# Patient Record
Sex: Male | Born: 1935 | Race: White | Hispanic: No | Marital: Married | Smoking: Former smoker
Health system: Southern US, Community
[De-identification: ages and names within clinical notes are randomized; demographics above are authoritative.]

## PROBLEM LIST (undated history)

## (undated) DIAGNOSIS — Z86010 Personal history of colonic polyps: Principal | ICD-10-CM

## (undated) DIAGNOSIS — I495 Sick sinus syndrome: Secondary | ICD-10-CM

## (undated) DIAGNOSIS — F411 Generalized anxiety disorder: Secondary | ICD-10-CM

## (undated) DIAGNOSIS — F329 Major depressive disorder, single episode, unspecified: Secondary | ICD-10-CM

## (undated) DIAGNOSIS — C61 Malignant neoplasm of prostate: Secondary | ICD-10-CM

## (undated) DIAGNOSIS — J309 Allergic rhinitis, unspecified: Secondary | ICD-10-CM

## (undated) HISTORY — DX: Major depressive disorder, single episode, unspecified: F32.9

## (undated) HISTORY — PX: NASAL SINUS SURGERY: SHX719

## (undated) HISTORY — PX: TONSILLECTOMY: SHX5217

## (undated) HISTORY — DX: Malignant neoplasm of prostate: C61

## (undated) HISTORY — PX: PROSTATE SURGERY: SHX751

## (undated) HISTORY — DX: Sick sinus syndrome: I49.5

## (undated) HISTORY — DX: Allergic rhinitis, unspecified: J30.9

## (undated) HISTORY — PX: OTHER SURGICAL HISTORY: SHX169

## (undated) HISTORY — DX: Generalized anxiety disorder: F41.1

## (undated) HISTORY — DX: Personal history of colonic polyps: Z86.010

---

## 2000-12-08 ENCOUNTER — Other Ambulatory Visit: Admission: RE | Admit: 2000-12-08 | Discharge: 2000-12-08 | Payer: Self-pay | Admitting: Otolaryngology

## 2003-03-30 ENCOUNTER — Ambulatory Visit (HOSPITAL_BASED_OUTPATIENT_CLINIC_OR_DEPARTMENT_OTHER): Admission: RE | Admit: 2003-03-30 | Discharge: 2003-03-30 | Payer: Self-pay | Admitting: Otolaryngology

## 2004-08-29 ENCOUNTER — Ambulatory Visit: Payer: Self-pay | Admitting: Internal Medicine

## 2004-09-26 ENCOUNTER — Ambulatory Visit: Payer: Self-pay | Admitting: Internal Medicine

## 2005-08-16 ENCOUNTER — Ambulatory Visit: Payer: Self-pay | Admitting: Internal Medicine

## 2005-09-16 ENCOUNTER — Ambulatory Visit: Payer: Self-pay | Admitting: Internal Medicine

## 2005-09-23 ENCOUNTER — Ambulatory Visit: Payer: Self-pay | Admitting: Gastroenterology

## 2005-10-03 ENCOUNTER — Encounter (INDEPENDENT_AMBULATORY_CARE_PROVIDER_SITE_OTHER): Payer: Self-pay | Admitting: *Deleted

## 2005-10-03 ENCOUNTER — Ambulatory Visit: Payer: Self-pay | Admitting: Gastroenterology

## 2006-02-06 ENCOUNTER — Ambulatory Visit: Payer: Self-pay | Admitting: Family Medicine

## 2006-04-01 ENCOUNTER — Ambulatory Visit (HOSPITAL_COMMUNITY): Payer: Self-pay | Admitting: *Deleted

## 2006-07-23 ENCOUNTER — Ambulatory Visit: Payer: Self-pay | Admitting: Internal Medicine

## 2006-09-28 ENCOUNTER — Ambulatory Visit: Payer: Self-pay | Admitting: Internal Medicine

## 2006-09-28 LAB — CONVERTED CEMR LAB
AST: 24 units/L (ref 0–37)
Bilirubin, Direct: 0.1 mg/dL (ref 0.0–0.3)
CO2: 30 meq/L (ref 19–32)
Chol/HDL Ratio, serum: 2.6
GFR calc non Af Amer: 78 mL/min
Glomerular Filtration Rate, Af Am: 95 mL/min/{1.73_m2}
HCT: 43.8 % (ref 39.0–52.0)
HDL: 63.2 mg/dL (ref >39.0)
PSA: 7.87 ng/mL — ABNORMAL HIGH (ref 0.10–4.00)
Potassium: 4.3 meq/L (ref 3.5–5.1)
RDW: 12.3 % (ref 11.5–14.6)
Sodium: 141 meq/L (ref 135–145)
Total Bilirubin: 1 mg/dL (ref 0.3–1.2)
Triglyceride fasting, serum: 56 mg/dL (ref 0–149)
VLDL: 11 mg/dL (ref 0–40)
WBC: 3.4 10*3/uL — ABNORMAL LOW (ref 4.5–10.5)

## 2006-11-11 ENCOUNTER — Ambulatory Visit (HOSPITAL_COMMUNITY): Admission: RE | Admit: 2006-11-11 | Discharge: 2006-11-11 | Payer: Self-pay | Admitting: Urology

## 2006-12-04 ENCOUNTER — Ambulatory Visit: Admission: RE | Admit: 2006-12-04 | Discharge: 2007-02-17 | Payer: Self-pay | Admitting: Radiation Oncology

## 2007-01-13 ENCOUNTER — Encounter (INDEPENDENT_AMBULATORY_CARE_PROVIDER_SITE_OTHER): Payer: Self-pay | Admitting: *Deleted

## 2007-01-13 ENCOUNTER — Inpatient Hospital Stay (HOSPITAL_COMMUNITY): Admission: RE | Admit: 2007-01-13 | Discharge: 2007-01-15 | Payer: Self-pay | Admitting: Urology

## 2007-02-05 ENCOUNTER — Ambulatory Visit: Payer: Self-pay | Admitting: Internal Medicine

## 2007-05-18 ENCOUNTER — Telehealth (INDEPENDENT_AMBULATORY_CARE_PROVIDER_SITE_OTHER): Payer: Self-pay | Admitting: *Deleted

## 2007-05-18 DIAGNOSIS — M79609 Pain in unspecified limb: Secondary | ICD-10-CM | POA: Insufficient documentation

## 2007-07-29 ENCOUNTER — Ambulatory Visit: Payer: Self-pay | Admitting: Internal Medicine

## 2007-10-05 ENCOUNTER — Ambulatory Visit: Payer: Self-pay | Admitting: Internal Medicine

## 2007-10-05 DIAGNOSIS — Z860100 Personal history of colon polyps, unspecified: Secondary | ICD-10-CM | POA: Insufficient documentation

## 2007-10-05 DIAGNOSIS — F411 Generalized anxiety disorder: Secondary | ICD-10-CM

## 2007-10-05 DIAGNOSIS — C61 Malignant neoplasm of prostate: Secondary | ICD-10-CM

## 2007-10-05 DIAGNOSIS — J309 Allergic rhinitis, unspecified: Secondary | ICD-10-CM | POA: Insufficient documentation

## 2007-10-05 HISTORY — DX: Malignant neoplasm of prostate: C61

## 2007-10-05 HISTORY — DX: Allergic rhinitis, unspecified: J30.9

## 2007-10-05 HISTORY — DX: Generalized anxiety disorder: F41.1

## 2007-10-05 HISTORY — DX: Personal history of colonic polyps: Z86.010

## 2007-10-05 HISTORY — DX: Personal history of colon polyps, unspecified: Z86.0100

## 2007-10-05 LAB — CONVERTED CEMR LAB
Basophils Absolute: 0 10*3/uL (ref 0.0–0.1)
Basophils Relative: 0.2 % (ref 0.0–1.0)
CO2: 32 meq/L (ref 19–32)
Calcium: 9.8 mg/dL (ref 8.4–10.5)
Cholesterol: 147 mg/dL (ref 0–200)
GFR calc non Af Amer: 78 mL/min
Glucose, Bld: 88 mg/dL (ref 70–99)
HCT: 40 % (ref 39.0–52.0)
HDL: 61.1 mg/dL (ref >39.0)
Monocytes Absolute: 0.2 10*3/uL (ref 0.2–0.7)
Monocytes Relative: 7.3 % (ref 3.0–11.0)
Neutro Abs: 1.7 10*3/uL (ref 1.4–7.7)
PSA: 0 ng/mL — ABNORMAL LOW (ref 0.10–4.00)
Platelets: 155 10*3/uL (ref 150–400)
RDW: 12.1 % (ref 11.5–14.6)
TSH: 2.71 microintl units/mL (ref 0.35–5.50)
Total CHOL/HDL Ratio: 2.4
Total Protein: 6.3 g/dL (ref 6.0–8.3)
Triglycerides: 58 mg/dL (ref 0–149)

## 2007-12-17 ENCOUNTER — Telehealth (INDEPENDENT_AMBULATORY_CARE_PROVIDER_SITE_OTHER): Payer: Self-pay | Admitting: *Deleted

## 2008-07-26 ENCOUNTER — Ambulatory Visit: Payer: Self-pay | Admitting: Internal Medicine

## 2008-10-04 ENCOUNTER — Ambulatory Visit: Payer: Self-pay | Admitting: Internal Medicine

## 2008-10-04 DIAGNOSIS — F329 Major depressive disorder, single episode, unspecified: Secondary | ICD-10-CM

## 2008-10-04 DIAGNOSIS — I495 Sick sinus syndrome: Secondary | ICD-10-CM | POA: Insufficient documentation

## 2008-10-04 DIAGNOSIS — F3289 Other specified depressive episodes: Secondary | ICD-10-CM

## 2008-10-04 HISTORY — DX: Major depressive disorder, single episode, unspecified: F32.9

## 2008-10-04 HISTORY — DX: Sick sinus syndrome: I49.5

## 2008-10-04 HISTORY — DX: Other specified depressive episodes: F32.89

## 2008-10-04 LAB — CONVERTED CEMR LAB
BUN: 18 mg/dL (ref 6–23)
Basophils Relative: 0.8 % (ref 0.0–3.0)
Bilirubin, Direct: 0.1 mg/dL (ref 0.0–0.3)
Cholesterol: 152 mg/dL (ref 0–200)
Creatinine, Ser: 0.9 mg/dL (ref 0.4–1.5)
Eosinophils Absolute: 0.1 10*3/uL (ref 0.0–0.7)
GFR calc Af Amer: 106 mL/min
GFR calc non Af Amer: 88 mL/min
Glucose, Bld: 91 mg/dL (ref 70–99)
Hemoglobin: 13.7 g/dL (ref 13.0–17.0)
LDL Cholesterol: 72 mg/dL (ref 0–99)
Lymphocytes Relative: 24.1 % (ref 12.0–46.0)
MCHC: 34.4 g/dL (ref 30.0–36.0)
MCV: 101.5 fL — ABNORMAL HIGH (ref 78.0–100.0)
Neutro Abs: 2 10*3/uL (ref 1.4–7.7)
Platelets: 131 10*3/uL — ABNORMAL LOW (ref 150–400)
RBC: 3.93 M/uL — ABNORMAL LOW (ref 4.22–5.81)
RDW: 12.1 % (ref 11.5–14.6)
Sodium: 143 meq/L (ref 135–145)
Total Bilirubin: 0.9 mg/dL (ref 0.3–1.2)
Total Protein: 6.4 g/dL (ref 6.0–8.3)
Triglycerides: 56 mg/dL (ref 0–149)
VLDL: 11 mg/dL (ref 0–40)

## 2008-11-07 ENCOUNTER — Ambulatory Visit: Payer: Self-pay | Admitting: Internal Medicine

## 2008-11-07 DIAGNOSIS — H6120 Impacted cerumen, unspecified ear: Secondary | ICD-10-CM | POA: Insufficient documentation

## 2008-12-11 ENCOUNTER — Telehealth: Payer: Self-pay | Admitting: Internal Medicine

## 2008-12-13 ENCOUNTER — Ambulatory Visit: Payer: Self-pay | Admitting: Internal Medicine

## 2009-07-10 ENCOUNTER — Ambulatory Visit: Payer: Self-pay | Admitting: Internal Medicine

## 2009-09-20 ENCOUNTER — Ambulatory Visit: Payer: Self-pay | Admitting: Internal Medicine

## 2009-09-20 DIAGNOSIS — J069 Acute upper respiratory infection, unspecified: Secondary | ICD-10-CM | POA: Insufficient documentation

## 2009-10-04 ENCOUNTER — Ambulatory Visit: Payer: Self-pay | Admitting: Internal Medicine

## 2009-10-04 LAB — CONVERTED CEMR LAB
ALT: 28 units/L (ref 0–53)
Albumin: 4.2 g/dL (ref 3.5–5.2)
Alkaline Phosphatase: 101 units/L (ref 39–117)
BUN: 16 mg/dL (ref 6–23)
Basophils Relative: 0.6 % (ref 0.0–3.0)
Bilirubin, Direct: 0 mg/dL (ref 0.0–0.3)
Calcium: 9.3 mg/dL (ref 8.4–10.5)
Eosinophils Relative: 2.6 % (ref 0.0–5.0)
GFR calc non Af Amer: 87.48 mL/min (ref >60)
Glucose, Bld: 91 mg/dL (ref 70–99)
Hemoglobin: 13.3 g/dL (ref 13.0–17.0)
Lymphocytes Relative: 24.8 % (ref 12.0–46.0)
Lymphs Abs: 0.8 10*3/uL (ref 0.7–4.0)
MCHC: 34.1 g/dL (ref 30.0–36.0)
MCV: 102.7 fL — ABNORMAL HIGH (ref 78.0–100.0)
Monocytes Absolute: 0.3 10*3/uL (ref 0.1–1.0)
Monocytes Relative: 9.3 % (ref 3.0–12.0)
Neutro Abs: 1.9 10*3/uL (ref 1.4–7.7)
Neutrophils Relative %: 62.7 % (ref 43.0–77.0)
Potassium: 5 meq/L (ref 3.5–5.1)
RDW: 12.5 % (ref 11.5–14.6)
TSH: 3.05 microintl units/mL (ref 0.35–5.50)
WBC: 3.1 10*3/uL — ABNORMAL LOW (ref 4.5–10.5)

## 2009-10-19 ENCOUNTER — Ambulatory Visit: Payer: Self-pay | Admitting: Internal Medicine

## 2009-10-19 ENCOUNTER — Telehealth: Payer: Self-pay | Admitting: Internal Medicine

## 2009-11-08 ENCOUNTER — Ambulatory Visit: Payer: Self-pay | Admitting: Internal Medicine

## 2010-03-20 ENCOUNTER — Ambulatory Visit: Payer: Self-pay | Admitting: Internal Medicine

## 2010-03-20 DIAGNOSIS — R197 Diarrhea, unspecified: Secondary | ICD-10-CM | POA: Insufficient documentation

## 2010-06-27 ENCOUNTER — Ambulatory Visit: Payer: Self-pay | Admitting: Internal Medicine

## 2010-09-12 ENCOUNTER — Encounter: Payer: Self-pay | Admitting: Internal Medicine

## 2010-10-04 ENCOUNTER — Ambulatory Visit: Payer: Self-pay | Admitting: Internal Medicine

## 2010-10-04 ENCOUNTER — Encounter: Payer: Self-pay | Admitting: Internal Medicine

## 2010-10-04 LAB — CONVERTED CEMR LAB
Albumin: 4 g/dL (ref 3.5–5.2)
BUN: 17 mg/dL (ref 6–23)
Basophils Absolute: 0 10*3/uL (ref 0.0–0.1)
Basophils Relative: 0.3 % (ref 0.0–3.0)
Eosinophils Relative: 1.5 % (ref 0.0–5.0)
GFR calc non Af Amer: 95.79 mL/min (ref >60.00)
Glucose, Bld: 88 mg/dL (ref 70–99)
HDL: 66.5 mg/dL (ref >39.00)
LDL Cholesterol: 86 mg/dL (ref 0–99)
Lymphocytes Relative: 20.9 % (ref 12.0–46.0)
Lymphs Abs: 0.7 10*3/uL (ref 0.7–4.0)
Monocytes Absolute: 0.3 10*3/uL (ref 0.1–1.0)
Neutro Abs: 2.3 10*3/uL (ref 1.4–7.7)
Potassium: 4.8 meq/L (ref 3.5–5.1)
RBC: 3.84 M/uL — ABNORMAL LOW (ref 4.22–5.81)
Sodium: 138 meq/L (ref 135–145)
Total Bilirubin: 1 mg/dL (ref 0.3–1.2)
Total Protein: 6.3 g/dL (ref 6.0–8.3)
Triglycerides: 54 mg/dL (ref 0.0–149.0)
WBC: 3.4 10*3/uL — ABNORMAL LOW (ref 4.5–10.5)

## 2010-11-12 NOTE — Assessment & Plan Note (Signed)
Summary: ear is stopped up/dm   Vital Signs:  Patient profile:   77 year old male Weight:      181 pounds Temp:     98.0 degrees F oral BP sitting:   120 / 76  (left arm) Cuff size:   regular  Vitals Entered By: Raechel Ache, RN (November 08, 2009 9:16 AM) CC: R ear stopped up and ringing.   CC:  R ear stopped up and ringing.Marland Kitchen  History of Present Illness: 77 year old patient with a 3-day history of diminished auditory acuity from the right ear.  No pain or discharge.  He has a history of anxiety disorder, depression, and prostate cancer, which have been stable.  Allergies: No Known Drug Allergies  Past History:  Past Medical History: Reviewed history from 10/04/2008 and no changes required. Anxiety Colonic polyps, hx of history of prostate cancer Allergic rhinitis Depression sinus bradycardia  Physical Exam  General:  Well-developed,well-nourished,in no acute distress; alert,appropriate and cooperative throughout examination Head:  Normocephalic and atraumatic without obvious abnormalities. No apparent alopecia or balding. Ears:  left canal and membrane normal with only a scanty amount of cerumen; right canal occluded with cerumen   Impression & Recommendations:  Problem # 1:  CERUMEN IMPACTION, RIGHT (ICD-380.4) right canal irrigated until clear  Problem # 2:  DEPRESSION (ICD-311)  His updated medication list for this problem includes:    Lexapro 10 Mg Tabs (Escitalopram oxalate) .Marland Kitchen... Take 1 tablet by mouth once a day    Niravam 0.5 Mg Tbdp (Alprazolam) .Marland Kitchen... 1 qid as needed  Complete Medication List: 1)  Lexapro 10 Mg Tabs (Escitalopram oxalate) .... Take 1 tablet by mouth once a day 2)  Niravam 0.5 Mg Tbdp (Alprazolam) .Marland Kitchen.. 1 qid as needed 3)  Doxycycline Hyclate 100 Mg Tabs (Doxycycline hyclate) .... One twice daily 4)  Alavert 10 Mg Tbdp (Loratadine) .... Use daily  Patient Instructions: 1)  Please schedule a follow-up appointment as needed.

## 2010-11-12 NOTE — Assessment & Plan Note (Signed)
Summary: EVAL OF BUMP / SORE ON BACK SIDE OF HEAD / BASE OF HEAD-NECK ...   Vital Signs:  Patient profile:   77 year old male Weight:      192 pounds Temp:     98.2 degrees F oral BP sitting:   118 / 78  (right arm) Cuff size:   regular  Vitals Entered By: Duard Brady LPN (June 27, 2010 7:57 AM) CC: eval 'bump' on back of neck , c/o loose stools Is Patient Diabetic? No   CC:  eval 'bump' on back of neck  and c/o loose stools.  History of Present Illness: 77 -year-old patient who is seen today for follow-up.  He has a history of anxiety, depression, and is followed by psychiatry.  He has been under considerable situational stress due to the mental health of his daughter.  He has had some mild diarrhea associated with increase in anxiety and insomnia.  He also complains of a fullness involving his left posterior neck area.  He is scheduled for a physical in 3 months. Flu Vaccine Consent Questions     Do you have a history of severe allergic reactions to this vaccine? no    Any prior history of allergic reactions to egg and/or gelatin? no    Do you have a sensitivity to the preservative Thimersol? no    Do you have a past history of Guillan-Barre Syndrome? no    Do you currently have an acute febrile illness? no    Have you ever had a severe reaction to latex? no    Vaccine information given and explained to patient? yes    Are you currently pregnant? no    Lot Number:[id number]   Exp Date:04/12/2011   Site Given  Left Deltoid IM  Allergies (verified): No Known Drug Allergies  Past History:  Past Medical History: Reviewed history from 10/04/2008 and no changes required. Anxiety Colonic polyps, hx of history of prostate cancer Allergic rhinitis Depression sinus bradycardia  Review of Systems  The patient denies anorexia, fever, weight loss, weight gain, vision loss, decreased hearing, hoarseness, chest pain, syncope, dyspnea on exertion, peripheral edema,  prolonged cough, headaches, hemoptysis, abdominal pain, melena, hematochezia, severe indigestion/heartburn, hematuria, incontinence, genital sores, muscle weakness, suspicious skin lesions, difficulty walking, depression, unusual weight change, abnormal bleeding, enlarged lymph nodes, angioedema, breast masses, and testicular masses.    Physical Exam  General:  Well-developed,well-nourished,in no acute distress; alert,appropriate and cooperative throughout examination Head:  Normocephalic and atraumatic without obvious abnormalities. No apparent alopecia or balding. Eyes:  No corneal or conjunctival inflammation noted. EOMI. Perrla. Funduscopic exam benign, without hemorrhages, exudates or papilledema. Vision grossly normal. Ears:  cerumen in the right canal and Mouth:  Oral mucosa and oropharynx without lesions or exudates.  Teeth in good repair. Neck:  no abnormalities noted  Lungs:  Normal respiratory effort, chest expands symmetrically. Lungs are clear to auscultation, no crackles or wheezes. Heart:  Normal rate and regular rhythm. S1 and S2 normal without gallop, murmur, click, rub or other extra sounds. Abdomen:  Bowel sounds positive,abdomen soft and non-tender without masses, organomegaly or hernias noted. Msk:  No deformity or scoliosis noted of thoracic or lumbar spine.   Pulses:  pedal pulses, full   Impression & Recommendations:  Problem # 1:  CERUMEN IMPACTION, RIGHT (ICD-380.4)  Problem # 2:  ANXIETY (ICD-300.00)  His updated medication list for this problem includes:    Lexapro 10 Mg Tabs (Escitalopram oxalate) .Marland Kitchen... Take 1 tablet by mouth  once a day    Niravam 0.5 Mg Tbdp (Alprazolam) .Marland Kitchen... 1 qid as needed  His updated medication list for this problem includes:    Lexapro 10 Mg Tabs (Escitalopram oxalate) .Marland Kitchen... Take 1 tablet by mouth once a day    Niravam 0.5 Mg Tbdp (Alprazolam) .Marland Kitchen... 1 qid as needed  Complete Medication List: 1)  Lexapro 10 Mg Tabs (Escitalopram  oxalate) .... Take 1 tablet by mouth once a day 2)  Niravam 0.5 Mg Tbdp (Alprazolam) .Marland Kitchen.. 1 qid as needed 3)  Biotin Forte 3 Mg Tabs (Biotin) .... Qd  Other Orders: Flu Vaccine 75yrs + MEDICARE PATIENTS (J4782) Administration Flu vaccine - MCR (N5621)  Patient Instructions: 1)  annual exam as scheduled in 3 months 2)  Please schedule a follow-up appointment as needed.

## 2010-11-12 NOTE — Assessment & Plan Note (Signed)
Summary: congestion//ccm   Vital Signs:  Patient profile:   77 year old male Weight:      180 pounds BMI:     25.92 Temp:     98.6 degrees F oral  Vitals Entered By: Raechel Ache, RN (October 19, 2009 10:29 AM) CC: Sinuses better but still trouble breathing.   CC:  Sinuses better but still trouble breathing.Marland Kitchen  History of Present Illness: 77 year old patient who is seen today for follow-up.  He was seen last month and treated for a sinus infection.  His symptoms have large resolved.  He still has some occasional nasal stuffiness.  After a minor soft tissue injury and the need for a tetanus update.  He was seen as a work in due to his sinus congestion.  Symptoms are minor.  He has had allergy testing and immunotherapy in the past  Allergies: No Known Drug Allergies  Past History:  Past Medical History: Reviewed history from 10/04/2008 and no changes required. Anxiety Colonic polyps, hx of history of prostate cancer Allergic rhinitis Depression sinus bradycardia  Review of Systems  The patient denies anorexia, fever, weight loss, weight gain, vision loss, decreased hearing, hoarseness, chest pain, syncope, dyspnea on exertion, peripheral edema, prolonged cough, headaches, hemoptysis, abdominal pain, melena, hematochezia, severe indigestion/heartburn, hematuria, incontinence, genital sores, muscle weakness, suspicious skin lesions, transient blindness, depression, unusual weight change, abnormal bleeding, enlarged lymph nodes, angioedema, breast masses, and testicular masses.    Physical Exam  General:  Well-developed,well-nourished,in no acute distress; alert,appropriate and cooperative throughout examination Head:  Normocephalic and atraumatic without obvious abnormalities. No apparent alopecia or balding. Eyes:  No corneal or conjunctival inflammation noted. EOMI. Perrla. Funduscopic exam benign, without hemorrhages, exudates or papilledema. Vision grossly normal. Ears:   cerumen left canal Nose:  External nasal examination shows no deformity or inflammation. Nasal mucosa are pink and moist without lesions or exudates. Mouth:  Oral mucosa and oropharynx without lesions or exudates.  Teeth in good repair. Neck:  No deformities, masses, or tenderness noted. Lungs:  Normal respiratory effort, chest expands symmetrically. Lungs are clear to auscultation, no crackles or wheezes.   Impression & Recommendations:  Problem # 1:  ALLERGIC RHINITIS (ICD-477.9)  His updated medication list for this problem includes:    Alavert 10 Mg Tbdp (Loratadine) ..... Use daily  His updated medication list for this problem includes:    Alavert 10 Mg Tbdp (Loratadine) ..... Use daily  Complete Medication List: 1)  Lexapro 10 Mg Tabs (Escitalopram oxalate) .... Take 1 tablet by mouth once a day 2)  Niravam 0.5 Mg Tbdp (Alprazolam) .Marland Kitchen.. 1 qid as needed 3)  Doxycycline Hyclate 100 Mg Tabs (Doxycycline hyclate) .... One twice daily 4)  Alavert 10 Mg Tbdp (Loratadine) .... Use daily  Other Orders: TD Toxoids IM 7 YR + (16109) Admin 1st Vaccine (60454)  Patient Instructions: 1)  Please schedule a follow-up appointment as needed.   Immunization History:  Pneumovax Immunization History:    Pneumovax:  pneumovax (07/14/2003)  Immunizations Administered:  Tetanus Vaccine:    Vaccine Type: Td    Site: left deltoid    Mfr: Sanofi Pasteur    Dose: 0.5 ml    Route: IM    Given by: Raechel Ache, RN    Exp. Date: 01/24/2011    Lot #: [id number]    VIS given: 08/31/07 version given October 19, 2009.

## 2010-11-12 NOTE — Assessment & Plan Note (Signed)
Summary: diarrhea/dm   Vital Signs:  Patient profile:   77 year old male Weight:      190 pounds Temp:     98.2 degrees F oral BP sitting:   128 / 80  (right arm) Cuff size:   regular  Vitals Entered By: Duard Brady LPN (March 20, 5408 10:09 AM) ouCC: c/o diarrhea on/off x2wks  past PO intake Is Patient Diabetic? No   CC:  c/o diarrhea on/off x2wks  past PO intake.  History of Present Illness:  77 year old patient who presents with a two week history of occasional loose stools; they seem to be precipitated by certain foods and dietary factors. There has been no nausea or vomiting or abdominal pain.  Loose stools occur usually once per day following a large meal.  Since his last visit here.  There is been a modest 10-pound weight gain.  He does state that his clothes are fitting much tighter.  He does have a history of depression, which has been stable  Preventive Screening-Counseling & Management  Alcohol-Tobacco     Smoking Status: quit  Allergies (verified): No Known Drug Allergies  Past History:  Past Medical History: Reviewed history from 10/04/2008 and no changes required. Anxiety Colonic polyps, hx of history of prostate cancer Allergic rhinitis Depression sinus bradycardia  Family History: Reviewed history from 10/05/2007 and no changes required. father died age 75, prostate cancer mother died age 75  One sister, history of breast cancer  Social History: Smoking Status:  quit  Review of Systems  The patient denies anorexia, fever, weight loss, weight gain, vision loss, decreased hearing, hoarseness, chest pain, syncope, dyspnea on exertion, peripheral edema, prolonged cough, headaches, hemoptysis, abdominal pain, melena, hematochezia, severe indigestion/heartburn, hematuria, incontinence, genital sores, muscle weakness, suspicious skin lesions, transient blindness, difficulty walking, depression, unusual weight change, abnormal bleeding, enlarged lymph  nodes, angioedema, breast masses, and testicular masses.    Physical Exam  General:  Well-developed,well-nourished,in no acute distress; alert,appropriate and cooperative throughout examination Head:  Normocephalic and atraumatic without obvious abnormalities. No apparent alopecia or balding. Eyes:  No corneal or conjunctival inflammation noted. EOMI. Perrla. Funduscopic exam benign, without hemorrhages, exudates or papilledema. Vision grossly normal. Mouth:  Oral mucosa and oropharynx without lesions or exudates.  Teeth in good repair. Neck:  No deformities, masses, or tenderness noted. Lungs:  Normal respiratory effort, chest expands symmetrically. Lungs are clear to auscultation, no crackles or wheezes. Heart:  Normal rate and regular rhythm. S1 and S2 normal without gallop, murmur, click, rub or other extra sounds. Abdomen:  Bowel sounds positive,abdomen soft and non-tender without masses, organomegaly or hernias noted. Msk:  No deformity or scoliosis noted of thoracic or lumbar spine.     Impression & Recommendations:  Problem # 1:  DEPRESSION (ICD-311)  His updated medication list for this problem includes:    Lexapro 10 Mg Tabs (Escitalopram oxalate) .Marland Kitchen... Take 1 tablet by mouth once a day    Niravam 0.5 Mg Tbdp (Alprazolam) .Marland Kitchen... 1 qid as needed  His updated medication list for this problem includes:    Lexapro 10 Mg Tabs (Escitalopram oxalate) .Marland Kitchen... Take 1 tablet by mouth once a day    Niravam 0.5 Mg Tbdp (Alprazolam) .Marland Kitchen... 1 qid as needed  Problem # 2:  DIARRHEA, ACUTE (ICD-787.91)  Complete Medication List: 1)  Lexapro 10 Mg Tabs (Escitalopram oxalate) .... Take 1 tablet by mouth once a day 2)  Niravam 0.5 Mg Tbdp (Alprazolam) .Marland Kitchen.. 1 qid as needed  Patient  Instructions: 1)  Align  one daily  2)  Please schedule a follow-up appointment as needed. 3)  Please schedule a follow-up appointment in 6 months for annual exam

## 2010-11-12 NOTE — Letter (Signed)
Summary: [facility]  [facility]   Imported By: Maryln Gottron 09/18/2010 15:02:24  _____________________________________________________________________  External Attachment:    Type:   Image     Comment:   External Document

## 2010-11-12 NOTE — Progress Notes (Signed)
Summary: tetanus & pneumonia vac  Phone Note Call from Patient   Caller: live Call For: kwia Summary of Call: Small wound to arm from a nail last pm.  Asking about need of tetanus & also pneumonia.  TD 2001Pnemovax 2004.  He is coming this am for ov & will get tetanus & pneumonia also if Dr. Kirtland Bouchard says.   Initial call taken by: Rudy Jew, RN,  October 19, 2009 8:39 AM

## 2010-11-14 NOTE — Assessment & Plan Note (Signed)
Summary: PT WILL COME IN FASTING/NJR   Vital Signs:  Patient profile:   77 year old male Height:      71 inches Weight:      192 pounds BMI:     26.88 Temp:     98.1 degrees F oral BP sitting:   128 / 72  (right arm) Cuff size:   regular  Vitals Entered By: Duard Brady LPN (October 04, 2010 8:38 AM) CC: cpx- doing well Is Patient Diabetic? No   CC:  cpx- doing well.  History of Present Illness: 77 year old patient who is in today for a health maintenance examination. He has a history of prostate cancer and is followed by urology.  He is doing quite well today and has had a recent urological evaluation.  He has a history also of colonic polyps  Here for Medicare AWV:  1.   Risk factors based on Past M, S, F history: is factors include   age 77.   Physical Activities: remains quite active.  walks 45 minutes daily 3.   Depression/mood: remote history depression, which has been stable for some time.  He is on chronic medications 4.   Hearing: no significant deficit 5.   ADL's: remains independent in all aspects of daily living 6.   Fall Risk: low 7.   Home Safety: no problem identified 8.   Height, weight, &visual acuity:height and weight stable.  No difficulty with visual acuity did have a recent eye examination.  This past week 9.   Counseling: heart healthy diet, exercise regimen, and modest weight loss.  All encouraged 10.   Labs ordered based on risk factors: laboratory profile, including PSA will be reviewed 11.           Referral Coordination- follow-up urology, and GI 12.           Care Plan- will need a colonoscopy, and urology follow-up 13.            Cognitive Assessment- alert and oriented, with normal affect.  No cognitive dysfunction   Allergies (verified): No Known Drug Allergies  Past History:  Past Medical History: Anxiety Colonic polyps, hx of history of prostate cancer Allergic rhinitis Depression sinus bradycardia  Past Surgical  History: Reviewed history from 10/05/2007 and no changes required. radical prostatectomy April 2008 Sinus surgery  septoplasty June 2004 Tonsillectomy age 77 psychotherapy in the 70s colonoscopy December 2006  Family History: Reviewed history from 10/05/2007 and no changes required. father died age 54, prostate cancer mother died age 32  One sister, history of breast cancer  Social History: Reviewed history from 10/05/2007 and no changes required. two children, one daughter her bipolar depression  Review of Systems  The patient denies anorexia, fever, weight loss, weight gain, vision loss, decreased hearing, hoarseness, chest pain, syncope, dyspnea on exertion, peripheral edema, prolonged cough, headaches, hemoptysis, abdominal pain, melena, hematochezia, severe indigestion/heartburn, hematuria, incontinence, genital sores, muscle weakness, suspicious skin lesions, transient blindness, difficulty walking, depression, unusual weight change, abnormal bleeding, enlarged lymph nodes, angioedema, breast masses, and testicular masses.    Physical Exam  General:  Well-developed,well-nourished,in no acute distress; alert,appropriate and cooperative throughout examination Head:  Normocephalic and atraumatic without obvious abnormalities. No apparent alopecia or balding. Eyes:  No corneal or conjunctival inflammation noted. EOMI. Perrla. Funduscopic exam benign, without hemorrhages, exudates or papilledema. Vision grossly normal. Ears:  External ear exam shows no significant lesions or deformities.  Otoscopic examination reveals clear canals, tympanic membranes are intact bilaterally  without bulging, retraction, inflammation or discharge. Hearing is grossly normal bilaterally. Nose:  External nasal examination shows no deformity or inflammation. Nasal mucosa are pink and moist without lesions or exudates. Mouth:  Oral mucosa and oropharynx without lesions or exudates.  Teeth in good repair. Neck:   No deformities, masses, or tenderness noted. Chest Wall:  No deformities, masses, tenderness or gynecomastia noted. Breasts:  No masses or gynecomastia noted Lungs:  Normal respiratory effort, chest expands symmetrically. Lungs are clear to auscultation, no crackles or wheezes. Heart:  Normal rate and regular rhythm. S1 and S2 normal without gallop, murmur, click, rub or other extra sounds. Abdomen:  Bowel sounds positive,abdomen soft and non-tender without masses, organomegaly or hernias noted. Rectal:  No external abnormalities noted. Normal sphincter tone. No rectal masses or tenderness. Genitalia:  Testes bilaterally descended without nodularity, tenderness or masses. No scrotal masses or lesions. No penis lesions or urethral discharge. Prostate:  surgically absent Msk:  No deformity or scoliosis noted of thoracic or lumbar spine.   Pulses:  R and L carotid,radial,femoral,dorsalis pedis and posterior tibial pulses are full and equal bilaterally Extremities:  No clubbing, cyanosis, edema, or deformity noted with normal full range of motion of all joints.   Neurologic:  No cranial nerve deficits noted. Station and gait are normal. Plantar reflexes are down-going bilaterally. DTRs are symmetrical throughout. Sensory, motor and coordinative functions appear intact. Skin:  Intact without suspicious lesions or rashes Cervical Nodes:  No lymphadenopathy noted Axillary Nodes:  No palpable lymphadenopathy Inguinal Nodes:  No significant adenopathy Psych:  Cognition and judgment appear intact. Alert and cooperative with normal attention span and concentration. No apparent delusions, illusions, hallucinations   Impression & Recommendations:  Problem # 1:  Preventive Health Care (ICD-V70.0)  Orders: Medicare -1st Annual Wellness Visit [phone]) Venipuncture (10272) TLB-Lipid Panel (80061-LIPID)  Complete Medication List: 1)  Lexapro 10 Mg Tabs (Escitalopram oxalate) .... Take 1 tablet by mouth  once a day 2)  Niravam 0.5 Mg Tbdp (Alprazolam) .Marland Kitchen.. 1 qid as needed 3)  Biotin Forte 3 Mg Tabs (Biotin) .... Qd 4)  Aspir-low 81 Mg Tbec (Aspirin)  Other Orders: TLB-BMP (Basic Metabolic Panel-BMET) (80048-METABOL) TLB-CBC Platelet - w/Differential (85025-CBCD) TLB-Hepatic/Liver Function Pnl (80076-HEPATIC) TLB-TSH (Thyroid Stimulating Hormone) (84443-TSH) Specimen Handling (53664)  Patient Instructions: 1)  Limit your Sodium (Salt) to less than 2 grams a day(slightly less than 1/2 a teaspoon) to prevent fluid retention, swelling, or worsening of symptoms. 2)  It is important that you exercise regularly at least 20 minutes 5 times a week. If you develop chest pain, have severe difficulty breathing, or feel very tired , stop exercising immediately and seek medical attention. 3)  You need to lose weight. Consider a lower calorie diet and regular exercise.  4)  Take an Aspirin every day. And aAnd a  Orders Added: 1)  Medicare -1st Annual Wellness Visit [G0438] 2)  Est. Patient Level III [40347] 3)  Venipuncture [36415] 4)  TLB-Lipid Panel [80061-LIPID] 5)  TLB-BMP (Basic Metabolic Panel-BMET) [80048-METABOL] 6)  TLB-CBC Platelet - w/Differential [85025-CBCD] 7)  TLB-Hepatic/Liver Function Pnl [80076-HEPATIC] 8)  TLB-TSH (Thyroid Stimulating Hormone) [84443-TSH] 9)  Specimen Handling [99000]

## 2011-02-10 ENCOUNTER — Ambulatory Visit (INDEPENDENT_AMBULATORY_CARE_PROVIDER_SITE_OTHER): Payer: Medicare Other | Admitting: Internal Medicine

## 2011-02-10 ENCOUNTER — Encounter: Payer: Self-pay | Admitting: Internal Medicine

## 2011-02-10 DIAGNOSIS — Z86010 Personal history of colonic polyps: Secondary | ICD-10-CM

## 2011-02-10 DIAGNOSIS — F411 Generalized anxiety disorder: Secondary | ICD-10-CM

## 2011-02-10 DIAGNOSIS — T07XXXA Unspecified multiple injuries, initial encounter: Secondary | ICD-10-CM

## 2011-02-10 DIAGNOSIS — T148XXA Other injury of unspecified body region, initial encounter: Secondary | ICD-10-CM

## 2011-02-10 NOTE — Progress Notes (Signed)
  Subjective:    Patient ID: Gary Osborn, male    DOB: Mar 30, 1933, 77 y.o.   MRN: [id number]  HPI 77 year old patient who was walking yesterday and tripped and fell. He was walking at a very brisk pace when he tripped and fell forward sustaining trauma to his right knee right elbow right shoulder and right facial area. He has noticed some mild tenderness in the right temporal scalp and more significant discomfort involving the right knee and shoulder region. There is no loss of consciousness. He has a history of anxiety disorder. Unfortunately his daughter committed suicide in March. She had a long history of psychiatric difficulties and multiple prior attempts     Review of Systems  Constitutional: Negative for fever, chills, appetite change and fatigue.  HENT: Negative for hearing loss, ear pain, congestion, sore throat, trouble swallowing, neck stiffness, dental problem, voice change and tinnitus.   Eyes: Negative for pain, discharge and visual disturbance.  Respiratory: Negative for cough, chest tightness, wheezing and stridor.   Cardiovascular: Negative for chest pain, palpitations and leg swelling.  Gastrointestinal: Negative for nausea, vomiting, abdominal pain, diarrhea, constipation, blood in stool and abdominal distention.  Genitourinary: Negative for urgency, hematuria, flank pain, discharge, difficulty urinating and genital sores.  Musculoskeletal: Negative for myalgias, back pain, joint swelling, arthralgias and gait problem.       Pain involving the right elbow shoulder and right scalp region  Skin: Negative for rash.  Neurological: Negative for dizziness, syncope, speech difficulty, weakness, numbness and headaches.  Hematological: Negative for adenopathy. Does not bruise/bleed easily.  Psychiatric/Behavioral: Negative for behavioral problems and dysphoric mood. The patient is not nervous/anxious.        Objective:   Physical Exam  Constitutional: He is oriented to person,  place, and time. He appears well-developed and well-nourished. No distress.  HENT:  Head: Normocephalic.  Right Ear: External ear normal.  Left Ear: External ear normal.       Very minimal tenderness involving the right temporal scalp region. No ecchymoses or hematoma noted  Eyes: Conjunctivae and EOM are normal.  Neck: Normal range of motion.  Cardiovascular: Normal rate and normal heart sounds.   Pulmonary/Chest: Breath sounds normal.  Abdominal: Bowel sounds are normal.  Musculoskeletal: Normal range of motion. He exhibits no edema and no tenderness.  Neurological: He is alert and oriented to person, place, and time.  Skin:       Abrasion over the right knee mild abrasion and early ecchymoses involving the right shoulder area  Psychiatric: He has a normal mood and affect. His behavior is normal.          Assessment & Plan:  Multiple contusions after a fall. No evidence of significant head trauma. We'll treat symptomatically and clinically observe

## 2011-02-10 NOTE — Patient Instructions (Signed)
Take 400-600 mg of ibuprofen ( Advil, Motrin) with food every 4 to 6 hours as needed for pain relief or control of fever  Call or return to clinic prn if these symptoms worsen or fail to improve as anticipated.  

## 2011-02-28 NOTE — H&P (Signed)
NAME:  Gary Osborn, Gary Osborn NO.:  [id number]   MEDICAL RECORD NO.:  [id number]          PATIENT TYPE:  INP   LOCATION:  1401                         FACILITY:  [facility]   PHYSICIAN:  Ronald L. Earlene Plater, M.D.  DATE OF BIRTH:  06-03-1933   DATE OF ADMISSION:  01/13/2007  DATE OF DISCHARGE:                              HISTORY & PHYSICAL   CHIEF COMPLAINT:  I have prostate cancer.   HISTORY OF PRESENT ILLNESS:  [name] is a very nice 77 year old  white male who presented with an elevated PSA.  It was found to be 7.87  which was a significant increase.  On examination, the prostate was  benign to palpation.  However, at biopsy, he was found to have  adenocarcinoma of prostate.  His Gleason score 7 which was 3+4 and 15%  of biopsies in the right side of the prostate and Gleason score 8 which  was 4+4 and 20% of the biopsies for the left side of the prostate.  Understanding risks, benefits and alternatives, he elected to proceed.  His white blood cell count was slightly low, but his absolute neutrophil  count was 1400.  In speaking with hematologist, they felt it was safe to  proceed.   ALLERGIES:  He has no known allergies.   PAST MEDICAL HISTORY:  1. He has suffered some anxiety and depression.  2. Hemorrhoids.  3. Has corrective lenses.  4. Has done well without significant problems.  5. Some panic attacks in the past.   PAST SURGICAL HISTORY:  1. Left node surgery.  2. Plastic surgery previously.  3. Hemorrhoid procedure.  4. T&A.   MEDICATIONS:  1. Lexapro.  2. Alprazolam.   SOCIAL HISTORY:  He has an occasional beer or wine and quit smoking in  1978.   FAMILY HISTORY:  Nonsignificant.   REVIEW OF SYSTEMS:  He has no shortness with this exertion, chest pain  or GI complaints.   PHYSICAL EXAMINATION:  VITAL SIGNS:  Blood pressure 120/74, pulse 53,  respirations 18, temperature 97.2 degrees Fahrenheit.  GENERAL:  He is  well-nourished, well-developed,  well-groomed, oriented x3.  HEENT:  Normal.  NECK:  Without masses or thyromegaly.  CHEST:  Normal diaphragmatic motion.  ABDOMEN:  Soft, nontender without mass, organomegaly or hernia.  HEART:  Normal sinus rhythm without murmurs or gallops.  EXTREMITY:  Normal.  NEUROLOGICAL:  Intact.  SKIN:  Normal penis, meatus, scrotum, testicle, adnexa, anus, perineum  normal.  RECTAL:  Prostate is 25 grams released, benign to palpation.   IMPRESSION:  Clinical stage T1C adenocarcinoma of the prostate.   PLAN:  Robotic radical prostatectomy and bilateral pelvic  lymphadenectomy.      Ronald L. Earlene Plater, M.D.  Electronically Signed     RLD/MEDQ  D:  01/13/2007  T:  01/14/2007  Job:  [id number]

## 2011-02-28 NOTE — Letter (Signed)
November 30, 2006    Windy Fast L. Earlene Plater, M.D., [facility]  [address]  [place],  [place] [address]   RE:  [name], [name]  MRN:  [id number]  /  DOB:  1932-11-08   Dear Dr. Earlene Plater:   Thank you for your evaluation of [name], this 25 mutual  patient who you have recently evaluated for prostate cancer.  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  He was last seen  in our office on September 28, 2006, for an annual exam.   MEDICAL PROBLEMS:  1. Diverticulosis.  2. History of BPH.  3. Allergic rhinitis.   FAMILY HISTORY:  Father died of prostate cancer at 63.   The patient has no history of cardiac disease and at that time had no  cardiac symptoms.  Electrocautery was performed and revealed a sinus  bradycardia with left axis deviation and inferolateral STT wave changes  that are unchanged from prior tracing.   At the present time, this individual is medically stable and there are  no contraindications to the anticipated surgery on January 13, 2007.    Sincerely,     Gordy Savers, MD  Electronically Signed   PFK/MedQ  DD: 11/30/2006  DT: 11/30/2006  Job #: [id number]

## 2011-02-28 NOTE — Op Note (Signed)
NAME:  TARUN, PATCHELL NO.:  [id number]   MEDICAL RECORD NO.:  [id number]                   PATIENT TYPE:  AMB   LOCATION:  DSC                                  FACILITY:  [facility]   PHYSICIAN:  Suzanna Obey, M.D.                    DATE OF BIRTH:  November 20, 1932   DATE OF PROCEDURE:  03/30/2003  DATE OF DISCHARGE:                                 OPERATIVE REPORT   PREOPERATIVE DIAGNOSIS:  Deviated septum and turbinate hypertrophy.   POSTOPERATIVE DIAGNOSIS:  Deviated septum and turbinate hypertrophy.   OPERATION PERFORMED:  Septoplasty and cauterization of both inferior  turbinates with outfracture.   SURGEON:  Suzanna Obey, M.D.   ANESTHESIA:  General endotracheal tube.   ESTIMATED BLOOD LOSS:  Less than 5mL.   INDICATIONS FOR PROCEDURE:  This is a 77 year old who has had chronic nasal  obstruction and congestion.  He has been treated medically and failed to  resolve his problems with obstruction.  He was informed of the risks and  benefits of the procedure including bleeding, infection, perforation,  chronic crusting and drying, change in the external appearance of the nose,  numbness of the teeth, and risks of the anesthetic.  All questions were  answered and consent was obtained.   DESCRIPTION OF PROCEDURE:  The patient was taken to the operating room and  placed in supine position.  After adequate general endotracheal tube  anesthesia, he was placed in a supine position, prepped and draped in the  usual sterile manner.  Oxymetazoline pledgets were placed bilaterally and  then the septum and inferior turbinates were injected with 1% lidocaine with  1:100,000 epinephrine.  The right hemitransfixion incision was performed  raising mucoperichondrial and mucoperiosteal flap.  The cartilage was  divided about 2 cm posterior to the caudal strut and removed with a Market researcher.  The opposite flap was elevated and the deviated bony septum was  removed  with a Jansen-Middleton forceps.  The spur that was cartilaginous  was removed with the Therapist, nutritional.  This corrected the septal deflection.  The turbinates were then infractured and the bipolar cautery was inserted  into the length of the turbinate and then cauterized at 20.  This did  cauterize the turbinates bilaterally nicely.  They were then both  outfractured.  The hemitransfixion incision was closed with interrupted 4-0  chromic and a 4-0 plain gut quilting stitch was placed through the septum.  The nasopharynx was suctioned out of all blood and debris.  The oral cavity  and oropharynx was suctioned out of all blood and debris.  The patient was  awakened and brought to recovery in stable condition, counts correct.  Suzanna Obey, M.D.   Cordelia Pen  D:  03/30/2003  T:  03/31/2003  Job:  [id number]   cc:   Gordy Savers, M.D. [facility]

## 2011-02-28 NOTE — Assessment & Plan Note (Signed)
[facility] OFFICE NOTE   Gary Osborn, Gary Osborn                       MRN:          [id number]  DATE:09/28/2006                            DOB:          1933-08-03    A 77 year old gentleman seen today for a wellness exam.  He has a  significant psychiatric history but his medical health has been fairly  unremarkable.  He does have a history of rectal polyps, BPH, and  seasonal allergic rhinitis.  He has required ENT surgery in the past.  He was admitted for psychiatric difficulties in the 26s.  Medical  regimen includes Lexapro and alprazolam.   REVIEW OF SYSTEMS:  Unremarkable.  Did have a colonoscopy 1 year ago.  Married, 2 sons.  One is completing law school at [place].  One  daughter lives at home with severe bipolar disorder and other  psychiatric difficulties.   FAMILY HISTORY:  Unchanged.  Father died of prostate cancer at 50.  Mother died at 96.  One sister with a history of breast cancer.   EXAM:  A well-developed male in no acute distress.  Blood pressure was 120/76.  Fundi, ears, nose, and throat clear.  No bruits or adenopathy.  CHEST:  Clear.  CARDIOVASCULAR:  Normal heart sounds.  No murmurs.  ABDOMEN:  Benign, mildly overweight.  No organomegaly or bruits.  External genitalia unremarkable.  RECTAL:  Prostate +2 and benign.  Stool Heme-negative.  EXTREMITIES:  Full peripheral pulses.  NEURO:  Negative.   IMPRESSION:  1. History of diverticulosis.  2. Benign prostatic hypertrophy.  3. Seasonal allergic rhinitis.   DISPOSITION:  We will continue his present regimen.  He does complain of  some delayed ejaculation but does not wish to discontinue Lexapro.  We  will recheck in 1 year.     Gordy Savers, MD  Electronically Signed    PFK/MedQ  DD: 09/28/2006  DT: 09/28/2006  Job #: [id number]

## 2011-02-28 NOTE — Op Note (Signed)
NAME:  Gary Osborn, Gary Osborn NO.:  [id number]   MEDICAL RECORD NO.:  [id number]          PATIENT TYPE:  INP   LOCATION:  0010                         FACILITY:  [facility]   PHYSICIAN:  Lucrezia Starch. Earlene Plater, M.D.  DATE OF BIRTH:  Feb 18, 1933   DATE OF PROCEDURE:  01/13/2007  DATE OF DISCHARGE:                               OPERATIVE REPORT   DIAGNOSIS:  Adenocarcinoma of prostate.   OPERATIVE PROCEDURE:  Robotic radical prostate and bilateral pelvic  lymphadenectomy.   SURGEON:  Lucrezia Starch. Earlene Plater, M.D.   ASSISTANT:  Heloise Purpura, M.D.   ANESTHESIA:  General endotracheal.   ESTIMATED BLOOD LOSS:  250 mL.   TUBES:  23 French Foley catheter and a large round Blake drain.   COMPLICATIONS:  None.   INDICATIONS FOR PROCEDURE:  Gary Osborn is a very nice 77 year old white  male who presented with an elevated PSA which was noted to be 7.87.  He  subsequently underwent biopsy of the prostate which revealed of  bilateral prostate cancer, Gleason score 7 which was 3+4 adenocarcinoma  in 15% of the biopsy from the right side of his prostate and a Gleason  score 8 which was for 4+4 in 20% of the biopsy from the left side of his  prostate.  His metastatic workup was normal without spread and after  understanding the risks, benefits and alternative, he elected to proceed  with robotic radical prostatectomy.   PROCEDURE IN DETAIL:  The patient was placed in the supine position.  After proper general endotracheal anesthesia, was placed in the  exaggerated lithotomy position, frog-leg, and prepped and draped with  Betadine in a sterile fashion.  A left periumbilical incision was made  approximately 1 cm in length. The fascia was incised as was the  posterior rectus sheath.  A 12 mm port was placed and the abdomen was  insufflated with carbon dioxide.  Inspection revealed there were a few  tiny adhesions that were taken down in the right lateral quadrant near  where a port would be  placed.  There was no significant bleeding noted.  Robotic ports were then placed in the appropriate position bilaterally  and the third arm port was placed on the left side lateral to the left  arm port.  A 5 mm working port was placed superiorly between the  periumbilical camera port and the right robotic port and a another 12 mm  port was placed lateral to this for laparoscopic manipulation.   The robot was docked and utilizing the hot shears in the right hand and  the Erbe graspers in the left hand, the procedure was performed.  Approximately 200 mL of sterile water was placed into the bladder and  the bladder flap was taken down to the space of Retzius.  The space of  Retzius was dissected free and the endopelvic fascia was incised  bilaterally.  The puboprostatic ligaments were partially incised.  It  was noted to be quite a tight pelvis.  Utilizing the Endo-GIA stapler,  the dorsal vein complex was stapled. The bladder neck was then  approached  and sharply incised to the Foley catheter which was used as  traction device with the third arm which we had placed. With a Prograsp,  the posterior bladder neck was taken down from the prostate and ampulla  vas deferens and seminal vesicles were dissected free and ampulla vas  deferens were cut bilaterally.  Denonvilliers' fascia was taken down and  a good plane was dissected posterior to that.   With the aggressive cancer, it was felt that an intense nerve saving was  probably not indicated and this had been discussed in detail.  There was  also some adherency bilaterally. It was felt that, especially on the  right side with Gleason 8, that we should go wider. The pedicles were  then taken in clips and the dissection was carried down to the apex of  the prostate.  There was some neurovascular tissue that was maintained  but a good wide margin was obtained and there was no obvious induration  outside of the line of resection. The urethra  was sharply incised and  the specimen was placed in the right gutter and good hemostasis was  present.  The pelvis was irrigated and the rectum was insufflated and  there was no rectal abnormalities noted.   Next, the bilateral pelvic lymph node dissection was performed. Both  external iliac and obturator lymph nodes were serially taken down.  The  obturator nerves were identified and protected bilaterally and the  external iliac veins were also identified and protected bilaterally.  Each were submitted as right and left side.  Next, the anastomosis was  performed.  It appeared that the bladder neck was of good size, 1 ampule  of indigo carmine was given IV, and a running urethrovesical anastomosis  was then performed with 3-0 Monocryl suture after a 2-0 Vicryl holding  sutured had been placed in the 6 o'clock position and tied anteriorly.   A 22-French Coude catheter was passed in the bladder.  The bladder was  noted to irrigate clear with blue dye.  There was no leakage visualized.  A large round Blake drain was placed through the third arm port.  All  ports were removed.  The specimen was placed in the Endopouch bag.  The  12 mm port was closed visually with a suture passer and each port was  visualized and removed and there was no significant bleeding. The 12 mm  port had been closed with a suture passer with a 2-0 Vicryl suture.  The  specimen was then removed.  The fascia of the periumbilical port was  closed with a running 2-0 Vicryl suture.  Each wound was injected with  0.25% Marcaine.  The skin was closed with skin staples.  The patient was  taken to the recovery room stable.      Ronald L. Earlene Plater, M.D.  Electronically Signed     RLD/MEDQ  D:  01/13/2007  T:  01/13/2007  Job:  [id number]

## 2011-03-11 ENCOUNTER — Encounter: Payer: Self-pay | Admitting: Internal Medicine

## 2011-03-11 ENCOUNTER — Ambulatory Visit (INDEPENDENT_AMBULATORY_CARE_PROVIDER_SITE_OTHER): Payer: Medicare Other | Admitting: Internal Medicine

## 2011-03-11 DIAGNOSIS — F329 Major depressive disorder, single episode, unspecified: Secondary | ICD-10-CM

## 2011-03-11 DIAGNOSIS — T148XXA Other injury of unspecified body region, initial encounter: Secondary | ICD-10-CM

## 2011-03-11 DIAGNOSIS — T07XXXA Unspecified multiple injuries, initial encounter: Secondary | ICD-10-CM

## 2011-03-11 NOTE — Progress Notes (Signed)
  Subjective:    Patient ID: Gary Osborn, male    DOB: Aug 13, 1933, 77 y.o.   MRN: [id number]  HPI 77 year old patient who tripped and fell forward on an uneven pavement yesterday. He sustained trauma to both knees and the  right shoulder area. He has had no difficulty with ambulation or range of motion involving the right shoulder. He has a history of depression which has been stable.  Review of Systems  Constitutional: Negative for fever, chills, appetite change and fatigue.  HENT: Negative for hearing loss, ear pain, congestion, sore throat, trouble swallowing, neck stiffness, dental problem, voice change and tinnitus.   Eyes: Negative for pain, discharge and visual disturbance.  Respiratory: Negative for cough, chest tightness, wheezing and stridor.   Cardiovascular: Negative for chest pain, palpitations and leg swelling.  Gastrointestinal: Negative for nausea, vomiting, abdominal pain, diarrhea, constipation, blood in stool and abdominal distention.  Genitourinary: Negative for urgency, hematuria, flank pain, discharge, difficulty urinating and genital sores.  Musculoskeletal: Negative for myalgias, back pain, joint swelling, arthralgias and gait problem.  Skin: Positive for wound. Negative for rash.  Neurological: Negative for dizziness, syncope, speech difficulty, weakness, numbness and headaches.  Hematological: Negative for adenopathy. Does not bruise/bleed easily.  Psychiatric/Behavioral: Negative for behavioral problems and dysphoric mood. The patient is not nervous/anxious.        Objective:   Physical Exam  Constitutional: He appears well-developed and well-nourished. No distress.  Skin: Skin is warm and dry.       Superficial abrasions over both knees the right greater than the left noted. Range of motion of the right knee intact. There is no joint effusion. He also had an ecchymoses present over the right anterior shoulder region          Assessment & Plan:

## 2011-03-11 NOTE — Patient Instructions (Signed)
Keep abrasions are clean and wash daily. Apply antibiotic ointment once daily and wrap Call if you  Develop  signs of infection such as worsening pain fever or drainage or erythema  Call or return to clinic prn if these symptoms worsen or fail to improve as anticipated.

## 2011-07-01 ENCOUNTER — Ambulatory Visit (INDEPENDENT_AMBULATORY_CARE_PROVIDER_SITE_OTHER): Payer: Medicare Other | Admitting: Internal Medicine

## 2011-07-01 DIAGNOSIS — L290 Pruritus ani: Secondary | ICD-10-CM

## 2011-07-01 DIAGNOSIS — K625 Hemorrhage of anus and rectum: Secondary | ICD-10-CM

## 2011-07-01 DIAGNOSIS — F411 Generalized anxiety disorder: Secondary | ICD-10-CM

## 2011-08-15 NOTE — Progress Notes (Signed)
System Downtime Recovery The EMR experienced a system downtime.  This downtime occurred on 07-01-2011. During this downtime paper charting was completed by the provider.  The visit was documented on paper during the downtime and will be scanned into [facility], billing was completed by the [facility] Billing Department .  The visit is being closed on behalf of the provider. 

## 2011-10-08 ENCOUNTER — Ambulatory Visit (INDEPENDENT_AMBULATORY_CARE_PROVIDER_SITE_OTHER): Payer: Medicare Other | Admitting: Internal Medicine

## 2011-10-08 ENCOUNTER — Encounter: Payer: Self-pay | Admitting: Internal Medicine

## 2011-10-08 VITALS — BP 122/78 | HR 60 | Temp 98.1°F | Resp 16 | Ht 71.0 in | Wt 207.0 lb

## 2011-10-08 DIAGNOSIS — Z0000 Encounter for general adult medical examination without abnormal findings: Secondary | ICD-10-CM

## 2011-10-08 DIAGNOSIS — E785 Hyperlipidemia, unspecified: Secondary | ICD-10-CM

## 2011-10-08 DIAGNOSIS — F329 Major depressive disorder, single episode, unspecified: Secondary | ICD-10-CM

## 2011-10-08 DIAGNOSIS — C61 Malignant neoplasm of prostate: Secondary | ICD-10-CM

## 2011-10-08 LAB — LIPID PANEL
Cholesterol: 134 mg/dL (ref 0–200)
LDL Cholesterol: 70 mg/dL (ref 0–99)
Total CHOL/HDL Ratio: 2
VLDL: 9.4 mg/dL (ref 0.0–40.0)

## 2011-10-08 LAB — CBC WITH DIFFERENTIAL/PLATELET
Eosinophils Absolute: 0.1 10*3/uL (ref 0.0–0.7)
Eosinophils Relative: 2.8 % (ref 0.0–5.0)
Hemoglobin: 14.4 g/dL (ref 13.0–17.0)
Lymphocytes Relative: 28.2 % (ref 12.0–46.0)
Lymphs Abs: 0.8 10*3/uL (ref 0.7–4.0)
MCHC: 34.6 g/dL (ref 30.0–36.0)
Monocytes Relative: 12.1 % — ABNORMAL HIGH (ref 3.0–12.0)
Neutro Abs: 1.6 10*3/uL (ref 1.4–7.7)
Platelets: 138 10*3/uL — ABNORMAL LOW (ref 150.0–400.0)
RBC: 4.21 Mil/uL — ABNORMAL LOW (ref 4.22–5.81)

## 2011-10-08 LAB — POCT URINALYSIS DIPSTICK
Bilirubin, UA: NEGATIVE
Blood, UA: NEGATIVE
Glucose, UA: NEGATIVE
Leukocytes, UA: NEGATIVE

## 2011-10-08 LAB — COMPREHENSIVE METABOLIC PANEL
Calcium: 9 mg/dL (ref 8.4–10.5)
Chloride: 105 mEq/L (ref 96–112)
Creatinine, Ser: 0.9 mg/dL (ref 0.4–1.5)
GFR: 82.75 mL/min (ref >60.00)
Potassium: 4.5 mEq/L (ref 3.5–5.1)
Sodium: 142 mEq/L (ref 135–145)
Total Bilirubin: 0.9 mg/dL (ref 0.3–1.2)

## 2011-10-08 LAB — TSH: TSH: 3.28 u[IU]/mL (ref 0.35–5.50)

## 2011-10-08 NOTE — Patient Instructions (Signed)
It is important that you exercise regularly, at least 20 minutes 3 to 4 times per week.  If you develop chest pain or shortness of breath seek  medical attention.  You need to lose weight.  Consider a lower calorie diet and regular exercise.  Limit your sodium (Salt) intake  Return in one year for follow-up

## 2011-10-08 NOTE — Progress Notes (Signed)
Subjective:    Patient ID: Gary Osborn, male    DOB: 11/28/1932, 77 y.o.   MRN: [id number]  HPIHistory of Present Illness:   77  year old patient who is in today for a health maintenance examination. He has a history of prostate cancer and is followed by urology. He is doing quite well today and has had a recent urological evaluation. He has a history also of colonic polyps   Here for Medicare AWV:   1. Risk factors based on Past M, S, F history: is factors include age  6. Physical Activities: remains quite active. walks 45 minutes  most days without exercise limitation  3. Depression/mood: remote history depression, which has been stable for some time. He is on chronic medications  4. Hearing: no significant deficit  5. ADL's: remains independent in all aspects of daily living  6. Fall Risk: low  7. Home Safety: no problem identified  8. Height, weight, &visual acuity:height and weight stable. No difficulty with visual acuity did have a recent eye examination. This past week  9. Counseling: heart healthy diet, exercise regimen, and modest weight loss. All encouraged  10. Labs ordered based on risk factors: laboratory profile, including PSA will be reviewed  11. Referral Coordination- follow-up urology, and GI  12. Care Plan- will need a colonoscopy, and urology follow-up  13. Cognitive Assessment- alert and oriented, with normal affect. No cognitive dysfunction   Allergies (verified):  No Known Drug Allergies   Past History:  Past Medical History:   Anxiety  Colonic polyps, hx of  history of prostate cancer  Allergic rhinitis  Depression  sinus bradycardia   Past Surgical History:   Reviewed history from 10/05/2007 and no changes required.  radical prostatectomy April 2008  Sinus surgery septoplasty June 2004  Tonsillectomy age 31  psychotherapy in the 70s  colonoscopy December 2006   Family History:   Reviewed history from 10/05/2007 and no changes required.    father died age 295, prostate cancer  mother died age 35  One sister, history of breast cancer   Social History:   Reviewed history from 10/05/2007 and no changes required.  two children, one daughter her bipolar depression     Wt Readings from Last 3 Encounters:  10/08/11 207 lb (93.895 kg)  03/11/11 197 lb (89.359 kg)  02/10/11 193 lb (87.544 kg)     Review of Systems  Constitutional: Negative for fever, chills, activity change, appetite change and fatigue.  HENT: Negative for hearing loss, ear pain, congestion, rhinorrhea, sneezing, mouth sores, trouble swallowing, neck pain, neck stiffness, dental problem, voice change, sinus pressure and tinnitus.   Eyes: Negative for photophobia, pain, redness and visual disturbance.  Respiratory: Negative for apnea, cough, choking, chest tightness, shortness of breath and wheezing.   Cardiovascular: Negative for chest pain, palpitations and leg swelling.  Gastrointestinal: Negative for nausea, vomiting, abdominal pain, diarrhea, constipation, blood in stool, abdominal distention, anal bleeding and rectal pain.  Genitourinary: Negative for dysuria, urgency, frequency, hematuria, flank pain, decreased urine volume, discharge, penile swelling, scrotal swelling, difficulty urinating (Some urinary incontinence following prostatectomy), genital sores and testicular pain.  Musculoskeletal: Negative for myalgias, back pain, joint swelling, arthralgias and gait problem.  Skin: Negative for color change, rash and wound.  Neurological: Negative for dizziness, tremors, seizures, syncope, facial asymmetry, speech difficulty, weakness, light-headedness, numbness and headaches.  Hematological: Negative for adenopathy. Does not bruise/bleed easily.  Psychiatric/Behavioral: Negative for suicidal ideas, hallucinations, behavioral problems, confusion, sleep disturbance, self-injury, dysphoric  mood, decreased concentration and agitation. The patient is not  nervous/anxious.        Objective:   Physical Exam  Constitutional: He appears well-developed and well-nourished.  HENT:  Head: Normocephalic and atraumatic.  Right Ear: External ear normal.  Left Ear: External ear normal.  Nose: Nose normal.  Mouth/Throat: Oropharynx is clear and moist.  Eyes: Conjunctivae and EOM are normal. Pupils are equal, round, and reactive to light. No scleral icterus.  Neck: Normal range of motion. Neck supple. No JVD present. No thyromegaly present.  Cardiovascular: Regular rhythm, normal heart sounds and intact distal pulses.  Exam reveals no gallop and no friction rub.   No murmur heard. Pulmonary/Chest: Effort normal and breath sounds normal. He exhibits no tenderness.  Abdominal: Soft. Bowel sounds are normal. He exhibits no distension and no mass. There is no tenderness.  Genitourinary: Penis normal.  Musculoskeletal: Normal range of motion. He exhibits no edema and no tenderness.  Lymphadenopathy:    He has no cervical adenopathy.  Neurological: He is alert. He has normal reflexes. No cranial nerve deficit. Coordination normal.  Skin: Skin is warm and dry. No rash noted.  Psychiatric: He has a normal mood and affect. His behavior is normal.          Assessment & Plan:    Preventive health examination Status post prostatectomy Depression- stable Colonic polyps  Will check updated labs. Followup urology and GI as scheduled. Return here in one year or as needed

## 2011-10-29 DIAGNOSIS — F3342 Major depressive disorder, recurrent, in full remission: Secondary | ICD-10-CM | POA: Diagnosis not present

## 2012-01-28 DIAGNOSIS — F3342 Major depressive disorder, recurrent, in full remission: Secondary | ICD-10-CM | POA: Diagnosis not present

## 2012-02-02 DIAGNOSIS — D235 Other benign neoplasm of skin of trunk: Secondary | ICD-10-CM | POA: Diagnosis not present

## 2012-02-02 DIAGNOSIS — D485 Neoplasm of uncertain behavior of skin: Secondary | ICD-10-CM | POA: Diagnosis not present

## 2012-02-02 DIAGNOSIS — L570 Actinic keratosis: Secondary | ICD-10-CM | POA: Diagnosis not present

## 2012-02-02 DIAGNOSIS — K1321 Leukoplakia of oral mucosa, including tongue: Secondary | ICD-10-CM | POA: Diagnosis not present

## 2012-03-19 DIAGNOSIS — K1321 Leukoplakia of oral mucosa, including tongue: Secondary | ICD-10-CM | POA: Diagnosis not present

## 2012-03-23 ENCOUNTER — Other Ambulatory Visit: Payer: Self-pay | Admitting: Otolaryngology

## 2012-03-23 DIAGNOSIS — K1321 Leukoplakia of oral mucosa, including tongue: Secondary | ICD-10-CM | POA: Diagnosis not present

## 2012-03-23 DIAGNOSIS — K1329 Other disturbances of oral epithelium, including tongue: Secondary | ICD-10-CM | POA: Diagnosis not present

## 2012-03-25 DIAGNOSIS — C61 Malignant neoplasm of prostate: Secondary | ICD-10-CM | POA: Diagnosis not present

## 2012-03-31 DIAGNOSIS — D235 Other benign neoplasm of skin of trunk: Secondary | ICD-10-CM | POA: Diagnosis not present

## 2012-04-01 DIAGNOSIS — N393 Stress incontinence (female) (male): Secondary | ICD-10-CM | POA: Diagnosis not present

## 2012-04-01 DIAGNOSIS — Z8546 Personal history of malignant neoplasm of prostate: Secondary | ICD-10-CM | POA: Diagnosis not present

## 2012-04-22 DIAGNOSIS — F3342 Major depressive disorder, recurrent, in full remission: Secondary | ICD-10-CM | POA: Diagnosis not present

## 2012-05-04 DIAGNOSIS — F3342 Major depressive disorder, recurrent, in full remission: Secondary | ICD-10-CM | POA: Diagnosis not present

## 2012-05-27 DIAGNOSIS — F3342 Major depressive disorder, recurrent, in full remission: Secondary | ICD-10-CM | POA: Diagnosis not present

## 2012-06-30 ENCOUNTER — Encounter: Payer: Self-pay | Admitting: Internal Medicine

## 2012-06-30 ENCOUNTER — Ambulatory Visit (INDEPENDENT_AMBULATORY_CARE_PROVIDER_SITE_OTHER): Payer: Medicare Other | Admitting: Internal Medicine

## 2012-06-30 VITALS — BP 110/74 | HR 93 | Temp 97.0°F | Wt 182.0 lb

## 2012-06-30 DIAGNOSIS — K143 Hypertrophy of tongue papillae: Secondary | ICD-10-CM | POA: Diagnosis not present

## 2012-06-30 DIAGNOSIS — R000 Tachycardia, unspecified: Secondary | ICD-10-CM | POA: Diagnosis not present

## 2012-06-30 DIAGNOSIS — R002 Palpitations: Secondary | ICD-10-CM | POA: Diagnosis not present

## 2012-06-30 LAB — BASIC METABOLIC PANEL
BUN: 19 mg/dL (ref 6–23)
Calcium: 9.6 mg/dL (ref 8.4–10.5)
Chloride: 105 mEq/L (ref 96–112)
Potassium: 5 mEq/L (ref 3.5–5.1)

## 2012-06-30 LAB — CBC WITH DIFFERENTIAL/PLATELET
Basophils Relative: 0.1 % (ref 0.0–3.0)
Hemoglobin: 13.8 g/dL (ref 13.0–17.0)
Lymphs Abs: 0.9 10*3/uL (ref 0.7–4.0)
MCV: 101 fl — ABNORMAL HIGH (ref 78.0–100.0)
RBC: 4.12 Mil/uL — ABNORMAL LOW (ref 4.22–5.81)
RDW: 13.8 % (ref 11.5–14.6)
WBC: 4.5 10*3/uL (ref 4.5–10.5)

## 2012-06-30 NOTE — Progress Notes (Signed)
  Subjective:    Patient ID: Gary Osborn, male    DOB: 10-04-1933, 78 y.o.   MRN: [id number]  HPI  78 year old white male complains of palpitations.  He reports symptoms started after lifting bag of heavy books at home.  He felting heart pounding in his chest for approximately 15 minutes.  He denies irregular heart beat.  He denies associated dizziness, chest pain or shortness of breath.  No hx of similar symptoms in the past.   He denies hx of CAD.   His symptoms resolved on its own.  Remote hx of tobacco use.  He drinks 4 glasses of wine per day.   Review of Systems No chest pain or shortness of breath  Past Medical History  Diagnosis Date  . ALLERGIC RHINITIS 10/05/2007  . ANXIETY 10/05/2007  . COLONIC POLYPS, HX OF 10/05/2007  . DEPRESSION 10/04/2008  . PROSTATE CANCER, UNSPEC. 10/05/2007  . SINUS BRADYCARDIA 10/04/2008    History   Social History  . Marital Status: Married    Spouse Name: N/A    Number of Children: N/A  . Years of Education: N/A   Occupational History  . Not on file.   Social History Main Topics  . Smoking status: Former Smoker    Quit date: 10/13/1973  . Smokeless tobacco: Never Used  . Alcohol Use: 7.0 oz/week    14 drink(s) per week  . Drug Use: No  . Sexually Active: Not on file   Other Topics Concern  . Not on file   Social History Narrative  . No narrative on file    Past Surgical History  Procedure Date  . Tonsillectomy   . Prostate surgery   . Nasal sinus surgery     No family history on file.  No Known Allergies  Current Outpatient Prescriptions on File Prior to Visit  Medication Sig Dispense Refill  . ALPRAZolam (XANAX) 0.5 MG tablet Take 0.5 mg by mouth 4 (four) times daily as needed.        Marland Kitchen aspirin 81 MG tablet Take 81 mg by mouth daily.        . Biotin 3 MG TABS Take by mouth daily.          BP 110/74  Pulse 93  Temp 97 F (36.1 C) (Oral)  Wt 182 lb (82.555 kg)       Objective:   Physical Exam    Constitutional: He is oriented to person, place, and time. He appears well-developed and well-nourished. No distress.  HENT:  Head: Normocephalic and atraumatic.  Right Ear: External ear normal.  Left Ear: External ear normal.       Dark film on tongue  Neck: Neck supple.       No carotid bruit  Cardiovascular: Normal rate, regular rhythm and normal heart sounds.   No murmur heard. Pulmonary/Chest: Effort normal and breath sounds normal. He has no wheezes.  Abdominal: Soft. Bowel sounds are normal. There is no tenderness.  Musculoskeletal: He exhibits no edema.  Lymphadenopathy:    He has no cervical adenopathy.  Neurological: He is alert and oriented to person, place, and time. No cranial nerve deficit.  Skin: Skin is warm and dry.  Psychiatric: He has a normal mood and affect. His behavior is normal.    EKG - NSR at 88 bpm.  Left axis  Left anterior fascicular block      Assessment & Plan:

## 2012-06-30 NOTE — Patient Instructions (Addendum)
Please follow up with Dr. Amador Cunas in 1 week Please call our office if your palpitations recur or gets worse.

## 2012-06-30 NOTE — Assessment & Plan Note (Signed)
Patient advised to brush his tongue twice daily.  Follow up with his PCP.

## 2012-06-30 NOTE — Assessment & Plan Note (Signed)
78 y/o white male experienced 15 minutes bout of heart "pounding" after lifting heavy bag of books.  His symptoms resolved on its own.  No worrisome EKG findings.  Check labs.  Monitor for now.  Follow up with PCP within 1 week.  If persistent symptoms or associated dizziness or chest pain, initiate further cardiac evaluation.    Question whether chronic alcohol consumption playing a role in his symptoms.

## 2012-07-08 ENCOUNTER — Ambulatory Visit (INDEPENDENT_AMBULATORY_CARE_PROVIDER_SITE_OTHER): Payer: Medicare Other | Admitting: Internal Medicine

## 2012-07-08 ENCOUNTER — Encounter: Payer: Self-pay | Admitting: Internal Medicine

## 2012-07-08 VITALS — BP 112/76 | HR 62 | Temp 98.2°F | Wt 182.0 lb

## 2012-07-08 DIAGNOSIS — Z23 Encounter for immunization: Secondary | ICD-10-CM | POA: Diagnosis not present

## 2012-07-08 DIAGNOSIS — R002 Palpitations: Secondary | ICD-10-CM | POA: Diagnosis not present

## 2012-07-08 DIAGNOSIS — F411 Generalized anxiety disorder: Secondary | ICD-10-CM

## 2012-07-08 DIAGNOSIS — F329 Major depressive disorder, single episode, unspecified: Secondary | ICD-10-CM | POA: Diagnosis not present

## 2012-07-08 DIAGNOSIS — F3289 Other specified depressive episodes: Secondary | ICD-10-CM

## 2012-07-08 NOTE — Progress Notes (Signed)
Subjective:    Patient ID: Gary Osborn, male    DOB: 10/23/1932, 78 y.o.   MRN: [id number]  HPI  78 year old patient who is seen today for followup after an episode of palpitations. This occurred in the setting of moderate alcohol use. He also had been consuming caffeine prior to driving home from restaurants. This has not recurred. EKG basically unchanged except for resolution of sinus bradycardia which has been noted in the past. Laboratory studies were performed that revealed mild thrombocytopenia. He also has a history of leukopenia He is followed closely by Dr. Donell Beers for anxiety depression and is now off antidepressants. He has been taking for alprazolam daily  Past Medical History  Diagnosis Date  . ALLERGIC RHINITIS 10/05/2007  . ANXIETY 10/05/2007  . COLONIC POLYPS, HX OF 10/05/2007  . DEPRESSION 10/04/2008  . PROSTATE CANCER, UNSPEC. 10/05/2007  . SINUS BRADYCARDIA 10/04/2008    History   Social History  . Marital Status: Married    Spouse Name: N/A    Number of Children: N/A  . Years of Education: N/A   Occupational History  . Not on file.   Social History Main Topics  . Smoking status: Former Smoker    Quit date: 10/13/1973  . Smokeless tobacco: Never Used  . Alcohol Use: 7.0 oz/week    14 drink(s) per week  . Drug Use: No  . Sexually Active: Not on file   Other Topics Concern  . Not on file   Social History Narrative  . No narrative on file    Past Surgical History  Procedure Date  . Tonsillectomy   . Prostate surgery   . Nasal sinus surgery     No family history on file.  No Known Allergies  Current Outpatient Prescriptions on File Prior to Visit  Medication Sig Dispense Refill  . ALPRAZolam (XANAX) 0.5 MG tablet Take 0.5 mg by mouth 4 (four) times daily as needed.        Marland Kitchen aspirin 81 MG tablet Take 81 mg by mouth daily.        . Biotin 3 MG TABS Take by mouth daily.        Marland Kitchen escitalopram (LEXAPRO) 5 MG tablet Take 5 mg by mouth daily.          BP 112/76  Pulse 62  Temp 98.2 F (36.8 C) (Oral)  Wt 182 lb (82.555 kg)       Review of Systems  Constitutional: Negative for fever, chills, appetite change and fatigue.  HENT: Negative for hearing loss, ear pain, congestion, sore throat, trouble swallowing, neck stiffness, dental problem, voice change and tinnitus.   Eyes: Negative for pain, discharge and visual disturbance.  Respiratory: Negative for cough, chest tightness, wheezing and stridor.   Cardiovascular: Positive for palpitations. Negative for chest pain and leg swelling.  Gastrointestinal: Negative for nausea, vomiting, abdominal pain, diarrhea, constipation, blood in stool and abdominal distention.  Genitourinary: Negative for urgency, hematuria, flank pain, discharge, difficulty urinating and genital sores.  Musculoskeletal: Negative for myalgias, back pain, joint swelling, arthralgias and gait problem.  Skin: Negative for rash.  Neurological: Negative for dizziness, syncope, speech difficulty, weakness, numbness and headaches.  Hematological: Negative for adenopathy. Does not bruise/bleed easily.  Psychiatric/Behavioral: Negative for behavioral problems and dysphoric mood. The patient is nervous/anxious.        Objective:   Physical Exam  Constitutional: He is oriented to person, place, and time. He appears well-developed.       Anxious. No  distress. Blood pressure normal.  HENT:  Head: Normocephalic.  Right Ear: External ear normal.  Left Ear: External ear normal.  Eyes: Conjunctivae normal and EOM are normal.  Neck: Normal range of motion.  Cardiovascular: Normal rate, regular rhythm, normal heart sounds and intact distal pulses.   Pulmonary/Chest: Effort normal and breath sounds normal.  Abdominal: Bowel sounds are normal.  Musculoskeletal: Normal range of motion. He exhibits no edema and no tenderness.  Neurological: He is alert and oriented to person, place, and time.  Psychiatric: He has a normal  mood and affect. His behavior is normal.          Assessment & Plan:   Tachycardia resolved Thrombocytopenia and history of mild leukopenia. Possible myelodysplastic disorder versus effects of alcohol. We'll recheck in 3 months at the time of his physical Anxiety depression. Follow Dr. Donell Beers

## 2012-07-08 NOTE — Patient Instructions (Signed)
Limit your sodium (Salt) intake    It is important that you exercise regularly, at least 20 minutes 3 to 4 times per week.  If you develop chest pain or shortness of breath seek  medical attention.  Return in 3 months for follow-up  

## 2012-07-14 DIAGNOSIS — F3342 Major depressive disorder, recurrent, in full remission: Secondary | ICD-10-CM | POA: Diagnosis not present

## 2012-07-20 ENCOUNTER — Encounter (HOSPITAL_COMMUNITY): Payer: Self-pay

## 2012-07-20 ENCOUNTER — Emergency Department (HOSPITAL_COMMUNITY)
Admission: EM | Admit: 2012-07-20 | Discharge: 2012-07-21 | Disposition: A | Discharge status: Skilled Nursing Facility | Payer: Medicare Other | Attending: Emergency Medicine | Admitting: Emergency Medicine

## 2012-07-20 DIAGNOSIS — Z7982 Long term (current) use of aspirin: Secondary | ICD-10-CM | POA: Diagnosis not present

## 2012-07-20 DIAGNOSIS — Z0389 Encounter for observation for other suspected diseases and conditions ruled out: Secondary | ICD-10-CM | POA: Diagnosis not present

## 2012-07-20 DIAGNOSIS — T424X4A Poisoning by benzodiazepines, undetermined, initial encounter: Secondary | ICD-10-CM | POA: Insufficient documentation

## 2012-07-20 DIAGNOSIS — F489 Nonpsychotic mental disorder, unspecified: Secondary | ICD-10-CM | POA: Diagnosis not present

## 2012-07-20 DIAGNOSIS — T424X1A Poisoning by benzodiazepines, accidental (unintentional), initial encounter: Secondary | ICD-10-CM

## 2012-07-20 DIAGNOSIS — I499 Cardiac arrhythmia, unspecified: Secondary | ICD-10-CM | POA: Diagnosis not present

## 2012-07-20 DIAGNOSIS — T1491XA Suicide attempt, initial encounter: Secondary | ICD-10-CM

## 2012-07-20 DIAGNOSIS — Y92009 Unspecified place in unspecified non-institutional (private) residence as the place of occurrence of the external cause: Secondary | ICD-10-CM | POA: Insufficient documentation

## 2012-07-20 DIAGNOSIS — R7301 Impaired fasting glucose: Secondary | ICD-10-CM | POA: Diagnosis not present

## 2012-07-20 DIAGNOSIS — T43502A Poisoning by unspecified antipsychotics and neuroleptics, intentional self-harm, initial encounter: Secondary | ICD-10-CM | POA: Insufficient documentation

## 2012-07-20 DIAGNOSIS — R45851 Suicidal ideations: Secondary | ICD-10-CM

## 2012-07-20 LAB — CBC
HCT: 37.7 % — ABNORMAL LOW (ref 39.0–52.0)
MCHC: 34.7 g/dL (ref 30.0–36.0)
MCV: 97.2 fL (ref 78.0–100.0)
Platelets: 141 10*3/uL — ABNORMAL LOW (ref 150–400)
RDW: 12.8 % (ref 11.5–15.5)

## 2012-07-20 LAB — COMPREHENSIVE METABOLIC PANEL
AST: 16 U/L (ref 0–37)
CO2: 31 mEq/L (ref 19–32)
Chloride: 99 mEq/L (ref 96–112)
Creatinine, Ser: 0.94 mg/dL (ref 0.50–1.35)
GFR calc non Af Amer: 79 mL/min — ABNORMAL LOW (ref >90)
Glucose, Bld: 84 mg/dL (ref 70–99)
Potassium: 4.1 mEq/L (ref 3.5–5.1)
Sodium: 134 mEq/L — ABNORMAL LOW (ref 135–145)
Total Bilirubin: 0.8 mg/dL (ref 0.3–1.2)

## 2012-07-20 LAB — URINALYSIS, ROUTINE W REFLEX MICROSCOPIC
Hgb urine dipstick: NEGATIVE
Nitrite: NEGATIVE
Protein, ur: NEGATIVE mg/dL
Specific Gravity, Urine: 1.016 (ref 1.005–1.030)
Urobilinogen, UA: 1 mg/dL (ref 0.0–1.0)

## 2012-07-20 LAB — RAPID URINE DRUG SCREEN, HOSP PERFORMED
Amphetamines: NOT DETECTED
Barbiturates: NOT DETECTED
Cocaine: NOT DETECTED

## 2012-07-20 MED ORDER — FLUMAZENIL 0.5 MG/5ML IV SOLN
0.2000 mg | Freq: Once | INTRAVENOUS | Status: AC
  Administered 2012-07-20: 0.2 mg via INTRAVENOUS
  Filled 2012-07-20: qty 5

## 2012-07-20 MED ORDER — ESCITALOPRAM OXALATE 10 MG PO TABS
20.0000 mg | ORAL_TABLET | Freq: Every day | ORAL | Status: DC
  Administered 2012-07-20 – 2012-07-21 (×2): 20 mg via ORAL
  Filled 2012-07-20: qty 2
  Filled 2012-07-20: qty 1

## 2012-07-20 MED ORDER — ASPIRIN EC 81 MG PO TBEC
81.0000 mg | DELAYED_RELEASE_TABLET | Freq: Every day | ORAL | Status: DC
  Administered 2012-07-20 – 2012-07-21 (×2): 81 mg via ORAL
  Filled 2012-07-20 (×2): qty 1

## 2012-07-20 NOTE — ED Notes (Signed)
Pt's wife took belongings home with her   

## 2012-07-20 NOTE — ED Provider Notes (Signed)
History     CSN: [id number]  Arrival date & time 07/20/12  7829   First MD Initiated Contact with Patient 07/20/12 [phone]     Level V caveat: Altered mental status secondary to drug overdose  Chief Complaint  Patient presents with  . Drug Overdose    (Consider location/radiation/quality/duration/timing/severity/associated sxs/prior treatment) HPIJohn T Osborn is a 78 y.o. male with a history of depression and a history of sinus bradycardia who presents with an overdose via EMS. Per EMS report, and the wife observation, he was found at his home laying on the floor with an empty pill bottle of temazepam which was 15 mg, 16 capsule is filled on 07/14/2012 prescribed for sleep. Patient does wake up but is lethargic and did mention the EMS and nursing staff that he was trying to kill himself. He says he had lots of problems.   Past Medical History  Diagnosis Date  . ALLERGIC RHINITIS 10/05/2007  . ANXIETY 10/05/2007  . COLONIC POLYPS, HX OF 10/05/2007  . DEPRESSION 10/04/2008  . PROSTATE CANCER, UNSPEC. 10/05/2007  . SINUS BRADYCARDIA 10/04/2008    Past Surgical History  Procedure Date  . Tonsillectomy   . Prostate surgery   . Nasal sinus surgery     No family history on file.  History  Substance Use Topics  . Smoking status: Former Smoker    Quit date: 10/13/1973  . Smokeless tobacco: Never Used  . Alcohol Use: 7.0 oz/week    14 drink(s) per week      Review of Systems Level V caveat: Altered mental status secondary to drug overdose Allergies  Review of patient's allergies indicates no known allergies.  Home Medications   Current Outpatient Rx  Name Route Sig Dispense Refill  . TEMAZEPAM 15 MG PO CAPS Oral Take 15-30 mg by mouth at bedtime as needed. 1 capsule orally at bedtime, may increase to 2 capsules at bedtime as needed for sleep    . ALPRAZOLAM 0.5 MG PO TABS Oral Take 0.5 mg by mouth 4 (four) times daily as needed.      . ASPIRIN 81 MG PO TABS Oral Take  81 mg by mouth daily.      Marland Kitchen BIOTIN 3 MG PO TABS Oral Take by mouth daily.      Marland Kitchen ESCITALOPRAM OXALATE 5 MG PO TABS Oral Take 5 mg by mouth daily.      BP 122/66  Pulse 79  Temp 98.6 F (37 C)  Resp 20  SpO2 99%  Physical Exam  Nursing notes reviewed.  Electronic medical record reviewed. VITAL SIGNS:   Filed Vitals:   07/20/12 0858  BP: 122/66  Pulse: 79  Temp: 98.6 F (37 C)  Resp: 20  SpO2: 99%   CONSTITUTIONAL: Awake, oriented, appears non-toxic HENT: Atraumatic, normocephalic, oral mucosa pink and moist, airway patent. Nares patent without drainage. External ears normal. EYES: Conjunctiva clear, EOMI, PERRLA NECK: Trachea midline, non-tender, supple CARDIOVASCULAR: Bradycardic heart rate, Normal rhythm, No murmurs, rubs, gallops PULMONARY/CHEST: Clear to auscultation, no rhonchi, wheezes, or rales. Symmetrical breath sounds. Non-tender. ABDOMINAL: Non-distended, soft, non-tender - no rebound or guarding.  BS normal. NEUROLOGIC: Non-focal, moving all four extremities, no gross sensory or motor deficits. EXTREMITIES: No clubbing, cyanosis, or edema SKIN: Warm, Dry, No erythema, No rash  ED Course  Procedures (including critical care time)  Date: 07/20/2012  Rate: 53  Rhythm: normal sinus rhythm  QRS Axis: normal  Intervals: normal  ST/T Wave abnormalities: normal  Conduction Disutrbances: none  Narrative Interpretation: No change from prior EKG-sinus bradycardia.     Labs Reviewed  CBC - Abnormal; Notable for the following:    WBC 3.5 (*)     RBC 3.88 (*)     HCT 37.7 (*)     Platelets 141 (*)     All other components within normal limits  URINALYSIS, ROUTINE W REFLEX MICROSCOPIC - Abnormal; Notable for the following:    APPearance CLOUDY (*)     All other components within normal limits  ACETAMINOPHEN LEVEL  COMPREHENSIVE METABOLIC PANEL  ETHANOL  SALICYLATE LEVEL  URINE RAPID DRUG SCREEN (HOSP PERFORMED)   No results found.   1. Suicide attempt    2. Suicidal ideation   3. Benzodiazepine overdose       MDM  Gary Osborn is a 78 y.o. male who presents extremely lethargic after intentional temazepam overdose with an apparent suicide attempt. Patient does have a history of being on benzodiazepines, based on his presentation and concern for deterioration since he took these medicines approximately 03 100 this morning, had administered 0.2 mg of flumazenil to achieve mild reversal of sedation.  Patient was mildly reversed and wakes up more easily and is not somnolent, he's not describing any pain at this time, he does say that he was trying to kill himself because he's got lots of problems at home.  Patient will require admission and further psychiatric care             Jones Skene, MD 07/20/12 1528

## 2012-07-20 NOTE — ED Notes (Signed)
Pt cannot move to psych ED d/t age and is unable to ambulate at this time. Will move to TCU when room available

## 2012-07-20 NOTE — ED Notes (Signed)
EAV:[id number]> Expected date:<BR> Expected time:<BR> Means of arrival:<BR> Comments:<BR> overdose

## 2012-07-20 NOTE — ED Notes (Signed)
Security called to wand pt/belongings. Pt notified he will be moving to tcu

## 2012-07-20 NOTE — ED Notes (Signed)
Telepsych paperwork done/faxed and called, but pt too lethargic at this time to do telepsych, told to call back when pt more alert.

## 2012-07-20 NOTE — ED Notes (Signed)
Per EMS pt lives at home, found by his wife laying in the floor with the empty pill bottle beside him, temazepam 15mg  60caps filled on 07/14/12; pt a/o but lethargic, states he was trying to kill himself, pt SI/SA, no changers on EKG, lungs clear, cbg 70 and , EMS placed a 20g to lt [facility]

## 2012-07-21 DIAGNOSIS — F39 Unspecified mood [affective] disorder: Secondary | ICD-10-CM | POA: Diagnosis not present

## 2012-07-21 DIAGNOSIS — R45850 Homicidal ideations: Secondary | ICD-10-CM | POA: Diagnosis not present

## 2012-07-21 DIAGNOSIS — Z8546 Personal history of malignant neoplasm of prostate: Secondary | ICD-10-CM | POA: Diagnosis not present

## 2012-07-21 DIAGNOSIS — F329 Major depressive disorder, single episode, unspecified: Secondary | ICD-10-CM | POA: Diagnosis not present

## 2012-07-21 DIAGNOSIS — F411 Generalized anxiety disorder: Secondary | ICD-10-CM | POA: Diagnosis not present

## 2012-07-21 DIAGNOSIS — R45851 Suicidal ideations: Secondary | ICD-10-CM | POA: Diagnosis not present

## 2012-07-21 NOTE — ED Notes (Signed)
Patient states that he does nt want to talk to me. Patient states he is sleepy . Is  More alert and oriented. Patient states he tried to kill hisself with pills. Not having any thoughts currently. Does not want to tell me why.

## 2012-07-21 NOTE — Progress Notes (Signed)
.  Clinical social worker assisted with patient discharge to [facility]. .Patient transportation provided by [employer] [facility] with patient chart copy. .No further Clinical Social Work needs, signing off.   Catha Gosselin, LCSWA  [phone] .07/21/2012 1912pm

## 2012-07-21 NOTE — ED Provider Notes (Signed)
Patient accepted to [facility]  Dr. Wendall Stade.   Gary Munch, MD 07/21/12 [phone]

## 2012-07-21 NOTE — ED Notes (Signed)
Spoke to patient and patient wanted television turned off. Patient alert and oriented to place. Mildly confused but more alert will cont monitor cognition. Patient stated that he has been feeling sad and partly has to do with his only child committing suicide.

## 2012-07-21 NOTE — Progress Notes (Signed)
CSW spoke with patient, who agreed to be admitting to [facility]. Per discussion with pt, and pt spouse, pt wishes to be transported by [facility]. Pt spouse plans to bring pt pajamas for pt to be transported to [facility] in. CSW will provide pt spouse with directions to [facility].  Pt Rn will need to call Rn report to [phone].   CSW will call [facility] once pt spouse has arrived with patient clothes.    .Clinical social worker continuing to follow pt to assist with pt dc plans and further csw needs.   Catha Gosselin, LCSWA  [phone] .07/21/2012 1804pm

## 2012-07-21 NOTE — [facility] Counselor (Signed)
TC from [place] @ [place]. Needed demographic sheet. Faxed this to complement the info sent previously.

## 2012-07-21 NOTE — [facility] Counselor (Signed)
Info sent to [facility] for IPT review. No beds at [facility]. [facility] reviewing

## 2012-07-21 NOTE — BH Assessment (Signed)
Assessment Note   Gary Osborn is an 78 y.o. male. Pt brought in via EMS after wife found pt unconscious. Pt admitted SI attempt by taking unknown amount of Temazepam (15mg ). Pt reports anniversary of his only  daughter's suicide. Stated he has been sad and depressed over this since it occurred, but also has been upset about possible plagiarism or copyright law infringement he fears he may face. 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 From these booklets he has copied many things to his newsletter that has "went all over the [place] and [place]". Pt stated he has spoken with a  Copyright attorney and discovered he may have been in violation. Pt is fearful, but nothing has been stated or brought out at this time. Pt stated he had multiple IPT stays in his lifetime but reported the most recent stay was at [facility] in the "early 1970's". Pt reports no changes in eating, decrease in sleeping, and increase in anxiety over the plagiarism. Telepsych has recommended IPT.  Axis I: Major Depression, single episode Axis II: Deferred Axis III:  Past Medical History  Diagnosis Date  . ALLERGIC RHINITIS 10/05/2007  . ANXIETY 10/05/2007  . COLONIC POLYPS, HX OF 10/05/2007  . DEPRESSION 10/04/2008  . PROSTATE CANCER, UNSPEC. 10/05/2007  . SINUS BRADYCARDIA 10/04/2008   Axis IV: other psychosocial or environmental problems and problems related to legal system/crime Axis V: 21-30 behavior considerably influenced by delusions or hallucinations OR serious impairment in judgment, communication OR inability to function in almost all areas  Past Medical History:  Past Medical History  Diagnosis Date  . ALLERGIC RHINITIS 10/05/2007  . ANXIETY 10/05/2007  . COLONIC POLYPS, HX OF 10/05/2007  . DEPRESSION 10/04/2008  . PROSTATE CANCER, UNSPEC. 10/05/2007  . SINUS BRADYCARDIA 10/04/2008    Past Surgical History  Procedure Date  . Tonsillectomy   . Prostate surgery    . Nasal sinus surgery     Family History: No family history on file.  Social History:  reports that he quit smoking about 38 years ago. He has never used smokeless tobacco. He reports that he drinks about 7 ounces of alcohol per week. He reports that he does not use illicit drugs.  Additional Social History:     CIWA: CIWA-Ar BP: 123/62 mmHg Pulse Rate: 72  COWS:    Allergies: No Known Allergies  Home Medications:  (Not in a hospital admission)  OB/GYN Status:  No LMP for male patient.  General Assessment Data Location of Assessment: [facility] ED Living Arrangements: Spouse/significant other Can pt return to current living arrangement?: Yes Admission Status: Voluntary Is patient capable of signing voluntary admission?: Yes Transfer from: Acute Hospital Referral Source: Other (EMS)  Education Status Is patient currently in school?: No  Risk to self Suicidal Ideation: Yes-Currently Present Suicidal Intent: Yes-Currently Present Is patient at risk for suicide?: Yes Suicidal Plan?: Yes-Currently Present Specify Current Suicidal Plan: OD on Rx (Pt OD on unknown amt of Temazepam 07/21/11) Access to Means: Yes Specify Access to Suicidal Means: Rx What has been your use of drugs/alcohol within the last 12 months?: 2 glasses of wine nightly Previous Attempts/Gestures: No (pt denied) How many times?: 0  Other Self Harm Risks: N/A Triggers for Past Attempts: None known Intentional Self Injurious Behavior: None Family Suicide History: Yes (Only daughter committed sucicide approx 1 year ago) Recent stressful life event(s): Other (Comment) (Anniversary of daughter's death) Persecutory voices/beliefs?: No Depression: Yes Depression Symptoms: Insomnia;Guilt  Substance abuse history and/or treatment for substance abuse?: No Suicide prevention information given to non-admitted patients: Not applicable  Risk to Others Homicidal Ideation: No Thoughts of Harm to Others: No Current  Homicidal Intent: No Current Homicidal Plan: No Access to Homicidal Means: No Identified Victim: N/A History of harm to others?: No Assessment of Violence: None Noted Does patient have access to weapons?: No Criminal Charges Pending?: No (Fearful that he will face copywright infringmenet charges) Does patient have a court date: No  Psychosis Hallucinations: None noted Delusions: None noted  Mental Status Report Appear/Hygiene: Other (Comment) (Unremarkable; blue scrubs) Eye Contact: Good Motor Activity: Freedom of movement Speech: Logical/coherent Level of Consciousness: Alert;Quiet/awake Mood: Apprehensive Affect: Appropriate to circumstance Anxiety Level: Minimal Thought Processes: Coherent;Relevant Judgement: Impaired Orientation: Person;Place;Time;Situation Obsessive Compulsive Thoughts/Behaviors: None  Cognitive Functioning Concentration: Normal Memory: Recent Intact;Remote Intact IQ: Average Insight: Fair Impulse Control: Poor Appetite: Good Weight Loss:  (Been trying to lose a little weight) Weight Gain: 0  Sleep: Decreased Total Hours of Sleep:  (Unknown - unable to share; has increased Rx lately) Vegetative Symptoms: None  ADLScreening [facility]) Patient's cognitive ability adequate to safely complete daily activities?: Yes Patient able to express need for assistance with ADLs?: Yes Independently performs ADLs?: Yes (appropriate for developmental age)  Abuse/Neglect [facility]) Physical Abuse: Denies Verbal Abuse: Denies Sexual Abuse: Denies  Prior Inpatient Therapy Prior Inpatient Therapy: Yes Prior Therapy Dates: 1970's Prior Therapy Facilty/Provider(s): [facility] Reason for Treatment: OCD; Panic DO  Prior Outpatient Therapy Prior Outpatient Therapy: Yes Prior Therapy Dates: Current Prior Therapy Facilty/Provider(s): Dr. Donell Beers Reason for Treatment: OCD; Panic DO  ADL Screening (condition at time of admission) Patient's cognitive ability  adequate to safely complete daily activities?: Yes Patient able to express need for assistance with ADLs?: Yes Independently performs ADLs?: Yes (appropriate for developmental age)       Abuse/Neglect Assessment (Assessment to be complete while patient is alone) Physical Abuse: Denies Verbal Abuse: Denies Sexual Abuse: Denies          Additional Information 1:1 In Past 12 Months?: No CIRT Risk: No Elopement Risk: No Does patient have medical clearance?: Yes     Disposition:  Disposition Disposition of Patient: Inpatient treatment program;Referred to [facility]; [facility]) Type of inpatient treatment program: Adult Patient referred to: Other (Comment) ([facility]; [facility])  On Site Evaluation by:   Reviewed with Physician:     Romeo Apple 07/21/2012 12:01 PM

## 2012-07-21 NOTE — ED Notes (Signed)
Called report to [facility], spoke to [place], Charity fundraiser at [employer], who stated understanding and denied questions/concerns; pt and family aware of discharge; stable at time of discharge

## 2012-08-02 DIAGNOSIS — F3342 Major depressive disorder, recurrent, in full remission: Secondary | ICD-10-CM | POA: Diagnosis not present

## 2012-08-09 DIAGNOSIS — F3342 Major depressive disorder, recurrent, in full remission: Secondary | ICD-10-CM | POA: Diagnosis not present

## 2012-08-11 DIAGNOSIS — F429 Obsessive-compulsive disorder, unspecified: Secondary | ICD-10-CM | POA: Diagnosis not present

## 2012-08-16 DIAGNOSIS — F429 Obsessive-compulsive disorder, unspecified: Secondary | ICD-10-CM | POA: Diagnosis not present

## 2012-08-23 DIAGNOSIS — F429 Obsessive-compulsive disorder, unspecified: Secondary | ICD-10-CM | POA: Diagnosis not present

## 2012-08-30 DIAGNOSIS — F429 Obsessive-compulsive disorder, unspecified: Secondary | ICD-10-CM | POA: Diagnosis not present

## 2012-09-08 DIAGNOSIS — F429 Obsessive-compulsive disorder, unspecified: Secondary | ICD-10-CM | POA: Diagnosis not present

## 2012-09-13 DIAGNOSIS — F429 Obsessive-compulsive disorder, unspecified: Secondary | ICD-10-CM | POA: Diagnosis not present

## 2012-09-20 DIAGNOSIS — F429 Obsessive-compulsive disorder, unspecified: Secondary | ICD-10-CM | POA: Diagnosis not present

## 2012-09-22 DIAGNOSIS — F3342 Major depressive disorder, recurrent, in full remission: Secondary | ICD-10-CM | POA: Diagnosis not present

## 2012-10-04 DIAGNOSIS — F429 Obsessive-compulsive disorder, unspecified: Secondary | ICD-10-CM | POA: Diagnosis not present

## 2012-10-11 ENCOUNTER — Encounter: Payer: Self-pay | Admitting: Internal Medicine

## 2012-10-11 ENCOUNTER — Ambulatory Visit (INDEPENDENT_AMBULATORY_CARE_PROVIDER_SITE_OTHER): Payer: Medicare Other | Admitting: Internal Medicine

## 2012-10-11 VITALS — BP 112/70 | HR 59 | Temp 97.8°F | Resp 20 | Ht 69.25 in | Wt 188.0 lb

## 2012-10-11 DIAGNOSIS — C61 Malignant neoplasm of prostate: Secondary | ICD-10-CM | POA: Diagnosis not present

## 2012-10-11 DIAGNOSIS — F329 Major depressive disorder, single episode, unspecified: Secondary | ICD-10-CM | POA: Diagnosis not present

## 2012-10-11 DIAGNOSIS — E785 Hyperlipidemia, unspecified: Secondary | ICD-10-CM | POA: Diagnosis not present

## 2012-10-11 DIAGNOSIS — Z86010 Personal history of colonic polyps: Secondary | ICD-10-CM

## 2012-10-11 DIAGNOSIS — R002 Palpitations: Secondary | ICD-10-CM | POA: Diagnosis not present

## 2012-10-11 DIAGNOSIS — I495 Sick sinus syndrome: Secondary | ICD-10-CM | POA: Diagnosis not present

## 2012-10-11 DIAGNOSIS — Z0000 Encounter for general adult medical examination without abnormal findings: Secondary | ICD-10-CM

## 2012-10-11 LAB — POCT URINALYSIS DIPSTICK
Glucose, UA: NEGATIVE
Leukocytes, UA: NEGATIVE
Nitrite, UA: NEGATIVE
Protein, UA: NEGATIVE
Urobilinogen, UA: 0.2

## 2012-10-11 LAB — COMPREHENSIVE METABOLIC PANEL
AST: 22 U/L (ref 0–37)
Albumin: 4.1 g/dL (ref 3.5–5.2)
Alkaline Phosphatase: 70 U/L (ref 39–117)
BUN: 17 mg/dL (ref 6–23)
Calcium: 9.1 mg/dL (ref 8.4–10.5)
Chloride: 105 mEq/L (ref 96–112)
Glucose, Bld: 92 mg/dL (ref 70–99)
Potassium: 4.4 mEq/L (ref 3.5–5.1)
Sodium: 141 mEq/L (ref 135–145)
Total Protein: 6.7 g/dL (ref 6.0–8.3)

## 2012-10-11 LAB — TSH: TSH: 3.03 u[IU]/mL (ref 0.35–5.50)

## 2012-10-11 LAB — LIPID PANEL
Cholesterol: 138 mg/dL (ref 0–200)
VLDL: 10.2 mg/dL (ref 0.0–40.0)

## 2012-10-11 NOTE — Progress Notes (Signed)
Subjective:    Patient ID: Gary Osborn, male    DOB: November 08, 1932, 78 y.o.   MRN: [id number]  HPI  Subjective:    Patient ID: Gary Osborn, male    DOB: 07-13-1933, 78 y.o.   MRN: [id number]  HPIHistory of Present Illness:   78   year old patient who is in today for a health maintenance examination. He has a history of prostate cancer and is followed by urology. He is doing quite well today and has had a recent urological evaluation. He has a history also of colonic polyps   Here for Medicare AWV:   1. Risk factors based on Past M, S, F history: is factors include age  78. Physical Activities: remains quite active. walks 45 minutes  most days without exercise limitation  3. Depression/mood: remote history depression, which has been stable for some time. He is on chronic medications  4. Hearing: no significant deficit  5. ADL's: remains independent in all aspects of daily living  6. Fall Risk: low  7. Home Safety: no problem identified  8. Height, weight, &visual acuity:height and weight stable. No difficulty with visual acuity did have a recent eye examination. This past week  9. Counseling: heart healthy diet, exercise regimen, and modest weight loss. All encouraged  10. Labs ordered based on risk factors: laboratory profile, including PSA will be reviewed  11. Referral Coordination- follow-up urology, and GI  12. Care Plan- will need a colonoscopy, and urology follow-up  13. Cognitive Assessment- alert and oriented, with normal affect. No cognitive dysfunction   Allergies (verified):  No Known Drug Allergies   Past History:  Past Medical History:   Anxiety  Colonic polyps, hx of  history of prostate cancer  Allergic rhinitis  Depression  sinus bradycardia   Past Surgical History:   Reviewed history from 10/05/2007 and no changes required.  radical prostatectomy April 2008  Sinus surgery septoplasty June 2004  Tonsillectomy age 30  psychotherapy in the 70s  colonoscopy  December 2006   Family History:   Reviewed history from 10/05/2007 and no changes required.  father died age 73, prostate cancer  mother died age 9  One sister, history of breast cancer   Social History:   Reviewed history from 10/05/2007 and no changes required.  two children, one daughter her bipolar depression     Wt Readings from Last 3 Encounters:  10/08/11 207 lb (93.895 kg)  03/11/11 197 lb (89.359 kg)  02/10/11 193 lb (87.544 kg)     Review of Systems  Constitutional: Negative for fever, chills, activity change, appetite change and fatigue.  HENT: Negative for hearing loss, ear pain, congestion, rhinorrhea, sneezing, mouth sores, trouble swallowing, neck pain, neck stiffness, dental problem, voice change, sinus pressure and tinnitus.   Eyes: Negative for photophobia, pain, redness and visual disturbance.  Respiratory: Negative for apnea, cough, choking, chest tightness, shortness of breath and wheezing.   Cardiovascular: Negative for chest pain, palpitations and leg swelling.  Gastrointestinal: Negative for nausea, vomiting, abdominal pain, diarrhea, constipation, blood in stool, abdominal distention, anal bleeding and rectal pain.  Genitourinary: Negative for dysuria, urgency, frequency, hematuria, flank pain, decreased urine volume, discharge, penile swelling, scrotal swelling, difficulty urinating (Some urinary incontinence following prostatectomy), genital sores and testicular pain.  Musculoskeletal: Negative for myalgias, back pain, joint swelling, arthralgias and gait problem.  Skin: Negative for color change, rash and wound.  Neurological: Negative for dizziness, tremors, seizures, syncope, facial asymmetry, speech difficulty, weakness, light-headedness, numbness and  headaches.  Hematological: Negative for adenopathy. Does not bruise/bleed easily.  Psychiatric/Behavioral: Negative for suicidal ideas, hallucinations, behavioral problems, confusion, sleep disturbance,  self-injury, dysphoric mood, decreased concentration and agitation. The patient is not nervous/anxious.        Objective:   Physical Exam  Constitutional: He appears well-developed and well-nourished.  HENT:  Head: Normocephalic and atraumatic.  Right Ear: External ear normal.  Left Ear: External ear normal.  Nose: Nose normal.  Mouth/Throat: Oropharynx is clear and moist.  Eyes: Conjunctivae and EOM are normal. Pupils are equal, round, and reactive to light. No scleral icterus.  Neck: Normal range of motion. Neck supple. No JVD present. No thyromegaly present.  Cardiovascular: Regular rhythm, normal heart sounds and intact distal pulses.  Exam reveals no gallop and no friction rub.   No murmur heard. Pulmonary/Chest: Effort normal and breath sounds normal. He exhibits no tenderness.  Abdominal: Soft. Bowel sounds are normal. He exhibits no distension and no mass. There is no tenderness.  Genitourinary: Penis normal.  Musculoskeletal: Normal range of motion. He exhibits no edema and no tenderness.  Lymphadenopathy:    He has no cervical adenopathy.  Neurological: He is alert. He has normal reflexes. No cranial nerve deficit. Coordination normal.  Skin: Skin is warm and dry. No rash noted.  Psychiatric: He has a normal mood and affect. His behavior is normal.          Assessment & Plan:    Preventive health examination Status post prostatectomy Depression- stable Colonic polyps  Will check updated labs. Followup urology and GI as scheduled. Return here in one year or as needed   Review of Systems     Objective:   Physical Exam        Assessment & Plan:

## 2012-10-11 NOTE — Patient Instructions (Signed)
Limit your sodium (Salt) intake    It is important that you exercise regularly, at least 20 minutes 3 to 4 times per week.  If you develop chest pain or shortness of breath seek  medical attention.  Return in one year for follow-up   

## 2012-10-12 DIAGNOSIS — F429 Obsessive-compulsive disorder, unspecified: Secondary | ICD-10-CM | POA: Diagnosis not present

## 2012-10-12 LAB — CBC WITH DIFFERENTIAL/PLATELET
Basophils Absolute: 0 10*3/uL (ref 0.0–0.1)
Eosinophils Absolute: 0.1 10*3/uL (ref 0.0–0.7)
Lymphocytes Relative: 25.6 % (ref 12.0–46.0)
MCHC: 33.8 g/dL (ref 30.0–36.0)
MCV: 98.4 fl (ref 78.0–100.0)
Monocytes Absolute: 0.1 10*3/uL (ref 0.1–1.0)
Neutrophils Relative %: 67.4 % (ref 43.0–77.0)
Platelets: 138 10*3/uL — ABNORMAL LOW (ref 150.0–400.0)
WBC: 3.1 10*3/uL — ABNORMAL LOW (ref 4.5–10.5)

## 2012-10-25 DIAGNOSIS — F429 Obsessive-compulsive disorder, unspecified: Secondary | ICD-10-CM | POA: Diagnosis not present

## 2012-10-26 DIAGNOSIS — K1321 Leukoplakia of oral mucosa, including tongue: Secondary | ICD-10-CM | POA: Diagnosis not present

## 2012-10-26 DIAGNOSIS — H6120 Impacted cerumen, unspecified ear: Secondary | ICD-10-CM | POA: Diagnosis not present

## 2012-11-10 DIAGNOSIS — F429 Obsessive-compulsive disorder, unspecified: Secondary | ICD-10-CM | POA: Diagnosis not present

## 2012-11-16 DIAGNOSIS — L578 Other skin changes due to chronic exposure to nonionizing radiation: Secondary | ICD-10-CM | POA: Diagnosis not present

## 2012-11-16 DIAGNOSIS — D235 Other benign neoplasm of skin of trunk: Secondary | ICD-10-CM | POA: Diagnosis not present

## 2012-11-29 DIAGNOSIS — F429 Obsessive-compulsive disorder, unspecified: Secondary | ICD-10-CM | POA: Diagnosis not present

## 2012-12-27 DIAGNOSIS — F429 Obsessive-compulsive disorder, unspecified: Secondary | ICD-10-CM | POA: Diagnosis not present

## 2013-01-03 DIAGNOSIS — H2510 Age-related nuclear cataract, unspecified eye: Secondary | ICD-10-CM | POA: Diagnosis not present

## 2013-01-03 DIAGNOSIS — H5210 Myopia, unspecified eye: Secondary | ICD-10-CM | POA: Diagnosis not present

## 2013-01-03 DIAGNOSIS — H52209 Unspecified astigmatism, unspecified eye: Secondary | ICD-10-CM | POA: Diagnosis not present

## 2013-01-03 DIAGNOSIS — H524 Presbyopia: Secondary | ICD-10-CM | POA: Diagnosis not present

## 2013-01-26 DIAGNOSIS — F429 Obsessive-compulsive disorder, unspecified: Secondary | ICD-10-CM | POA: Diagnosis not present

## 2013-02-23 DIAGNOSIS — F429 Obsessive-compulsive disorder, unspecified: Secondary | ICD-10-CM | POA: Diagnosis not present

## 2013-03-21 DIAGNOSIS — F3342 Major depressive disorder, recurrent, in full remission: Secondary | ICD-10-CM | POA: Diagnosis not present

## 2013-03-23 DIAGNOSIS — F429 Obsessive-compulsive disorder, unspecified: Secondary | ICD-10-CM | POA: Diagnosis not present

## 2013-03-25 DIAGNOSIS — C61 Malignant neoplasm of prostate: Secondary | ICD-10-CM | POA: Diagnosis not present

## 2013-04-11 DIAGNOSIS — C61 Malignant neoplasm of prostate: Secondary | ICD-10-CM | POA: Diagnosis not present

## 2013-04-12 DIAGNOSIS — L570 Actinic keratosis: Secondary | ICD-10-CM | POA: Diagnosis not present

## 2013-04-12 DIAGNOSIS — D485 Neoplasm of uncertain behavior of skin: Secondary | ICD-10-CM | POA: Diagnosis not present

## 2013-05-06 DIAGNOSIS — B3749 Other urogenital candidiasis: Secondary | ICD-10-CM | POA: Diagnosis not present

## 2013-05-23 DIAGNOSIS — F429 Obsessive-compulsive disorder, unspecified: Secondary | ICD-10-CM | POA: Diagnosis not present

## 2013-06-20 DIAGNOSIS — F429 Obsessive-compulsive disorder, unspecified: Secondary | ICD-10-CM | POA: Diagnosis not present

## 2013-06-22 ENCOUNTER — Ambulatory Visit (INDEPENDENT_AMBULATORY_CARE_PROVIDER_SITE_OTHER): Payer: Medicare Other

## 2013-06-22 DIAGNOSIS — Z23 Encounter for immunization: Secondary | ICD-10-CM | POA: Diagnosis not present

## 2013-07-18 DIAGNOSIS — F3342 Major depressive disorder, recurrent, in full remission: Secondary | ICD-10-CM | POA: Diagnosis not present

## 2013-07-18 DIAGNOSIS — F429 Obsessive-compulsive disorder, unspecified: Secondary | ICD-10-CM | POA: Diagnosis not present

## 2013-07-20 DIAGNOSIS — R042 Hemoptysis: Secondary | ICD-10-CM | POA: Diagnosis not present

## 2013-07-20 DIAGNOSIS — K121 Other forms of stomatitis: Secondary | ICD-10-CM | POA: Diagnosis not present

## 2013-08-08 ENCOUNTER — Ambulatory Visit (INDEPENDENT_AMBULATORY_CARE_PROVIDER_SITE_OTHER): Payer: Medicare Other | Admitting: Internal Medicine

## 2013-08-08 ENCOUNTER — Encounter: Payer: Self-pay | Admitting: Internal Medicine

## 2013-08-08 ENCOUNTER — Telehealth: Payer: Self-pay | Admitting: Internal Medicine

## 2013-08-08 VITALS — BP 120/76 | HR 76 | Temp 98.0°F | Resp 20 | Wt 211.0 lb

## 2013-08-08 DIAGNOSIS — J309 Allergic rhinitis, unspecified: Secondary | ICD-10-CM

## 2013-08-08 DIAGNOSIS — R040 Epistaxis: Secondary | ICD-10-CM | POA: Diagnosis not present

## 2013-08-08 NOTE — Telephone Encounter (Signed)
Patient Information:  Caller Name: Donald  Phone: [phone]  Patient: Gary Osborn, Gary Osborn  Gender: Male  DOB: [birth year]  Age: 79 Years  PCP: Eleonore Chiquito (Family Practice > 43yrs old)  Office Follow Up:  Does the office need to follow up with this patient?: No  Instructions For The Office: N/A  RN Note:  Has seen Dr Isaiah Blakes ENT who thinks blood not coming from head, is probably digestive.  Symptoms  Reason For Call & Symptoms: Has noticed blood in mouth when waking in the mornings at intervals.  Sleeps on stomach, tends to drool lot of saliva.  Will see blood in saliva spots 6 times or so in the last 2 months, twice over the last weekend.  Blood spots about size of glass lense to just small spots.  Reviewed Health History In EMR: Yes  Reviewed Medications In EMR: Yes  Reviewed Allergies In EMR: Yes  Reviewed Surgeries / Procedures: Yes  Date of Onset of Symptoms: Unknown  Guideline(s) Used:  No Protocol Available - Sick Adult  Disposition Per Guideline:   See Today in Office  Reason For Disposition Reached:   Patient wants to be seen  Advice Given:  N/A  Patient Will Follow Care Advice:  YES  Appointment Scheduled:  08/08/2013 16:15:00 Appointment Scheduled Provider:  Eleonore Chiquito (Family Practice > 55yrs old)

## 2013-08-08 NOTE — Telephone Encounter (Signed)
FYI: patient is coming at 4:15pm today

## 2013-08-08 NOTE — Patient Instructions (Addendum)
Call or return to clinic prn if these symptoms worsen or fail to improve as anticipated.  Return in 2 months for your annual exam

## 2013-08-08 NOTE — Progress Notes (Signed)
Subjective:    Patient ID: Gary Osborn, male    DOB: 20-Apr-1933, 79 y.o.   MRN: [id number]  HPI  79 year old patient who has a history of allergic rhinitis. For the past 6 weeks he has had 5 or 6 episodes of mildly blood-tinged saliva noted on his pillowcase. He was concerned about the possibility of serious pathology. He did have an EMG examination with Dr. Donell Beers last week. No weight loss or other constitutional complaints. Denies any cough. He does state that he has frequent nosebleeds. He does see his dentist twice annually and denies any gingival bleeding  Past Medical History  Diagnosis Date  . ALLERGIC RHINITIS 10/05/2007  . ANXIETY 10/05/2007  . COLONIC POLYPS, HX OF 10/05/2007  . DEPRESSION 10/04/2008  . PROSTATE CANCER, UNSPEC. 10/05/2007  . SINUS BRADYCARDIA 10/04/2008    History   Social History  . Marital Status: Married    Spouse Name: N/A    Number of Children: N/A  . Years of Education: N/A   Occupational History  . Not on file.   Social History Main Topics  . Smoking status: Former Smoker    Quit date: 10/13/1973  . Smokeless tobacco: Never Used  . Alcohol Use: 7.0 oz/week    14 drink(s) per week  . Drug Use: No  . Sexual Activity: Not on file   Other Topics Concern  . Not on file   Social History Narrative  . No narrative on file    Past Surgical History  Procedure Laterality Date  . Tonsillectomy    . Prostate surgery    . Nasal sinus surgery      No family history on file.  No Known Allergies  Current Outpatient Prescriptions on File Prior to Visit  Medication Sig Dispense Refill  . busPIRone (BUSPAR) 10 MG tablet Take 10 mg by mouth 2 (two) times daily.      . clonazePAM (KLONOPIN) 0.5 MG tablet Take by mouth. One tablet by mouth every morning and one tablet by mouth at bedtime.      Marland Kitchen escitalopram (LEXAPRO) 20 MG tablet Take 20 mg by mouth daily.      Marland Kitchen OLANZapine (ZYPREXA) 10 MG tablet Take 5 mg by mouth 2 (two) times daily.        No current facility-administered medications on file prior to visit.    BP 120/76  Pulse 76  Temp(Src) 98 F (36.7 C) (Oral)  Resp 20  Wt 211 lb (95.709 kg)  BMI 30.93 kg/m2  SpO2 95%       Review of Systems  Constitutional: Negative for fever, chills, appetite change and fatigue.  HENT: Positive for nosebleeds. Negative for congestion, dental problem, ear pain, hearing loss, sore throat, tinnitus, trouble swallowing and voice change.   Eyes: Negative for pain, discharge and visual disturbance.  Respiratory: Negative for cough, chest tightness, wheezing and stridor.   Cardiovascular: Negative for chest pain, palpitations and leg swelling.  Gastrointestinal: Negative for nausea, vomiting, abdominal pain, diarrhea, constipation, blood in stool and abdominal distention.  Genitourinary: Negative for urgency, hematuria, flank pain, discharge, difficulty urinating and genital sores.  Musculoskeletal: Negative for arthralgias, back pain, gait problem, joint swelling, myalgias and neck stiffness.  Skin: Negative for rash.  Neurological: Negative for dizziness, syncope, speech difficulty, weakness, numbness and headaches.  Hematological: Negative for adenopathy. Does not bruise/bleed easily.  Psychiatric/Behavioral: Negative for behavioral problems and dysphoric mood. The patient is not nervous/anxious.        Objective:  Physical Exam  Constitutional: He is oriented to person, place, and time. He appears well-developed.  HENT:  Head: Normocephalic.  Right Ear: External ear normal.  Left Ear: External ear normal.  Eyes: Conjunctivae and EOM are normal.  Neck: Normal range of motion.  Cardiovascular: Normal rate and normal heart sounds.   Pulmonary/Chest: Breath sounds normal.  Abdominal: Bowel sounds are normal.  Musculoskeletal: Normal range of motion. He exhibits no edema and no tenderness.  Neurological: He is alert and oriented to person, place, and time.  Psychiatric:  He has a normal mood and affect. His behavior is normal.          Assessment & Plan:   Probable recurrent mild epistaxis. Patient reassured. He does have a complete annual physical scheduled in 2 months. We'll reassess at that time Allergic rhinitis

## 2013-08-08 NOTE — Telephone Encounter (Signed)
Noted  

## 2013-08-12 ENCOUNTER — Encounter: Payer: Self-pay | Admitting: Gastroenterology

## 2013-08-22 DIAGNOSIS — F429 Obsessive-compulsive disorder, unspecified: Secondary | ICD-10-CM | POA: Diagnosis not present

## 2013-09-12 ENCOUNTER — Other Ambulatory Visit (INDEPENDENT_AMBULATORY_CARE_PROVIDER_SITE_OTHER): Payer: Medicare Other

## 2013-09-12 ENCOUNTER — Ambulatory Visit (INDEPENDENT_AMBULATORY_CARE_PROVIDER_SITE_OTHER): Payer: Medicare Other | Admitting: Gastroenterology

## 2013-09-12 ENCOUNTER — Encounter: Payer: Self-pay | Admitting: Gastroenterology

## 2013-09-12 VITALS — BP 128/80 | HR 68 | Ht 69.25 in | Wt 211.6 lb

## 2013-09-12 DIAGNOSIS — K625 Hemorrhage of anus and rectum: Secondary | ICD-10-CM

## 2013-09-12 DIAGNOSIS — K1370 Unspecified lesions of oral mucosa: Secondary | ICD-10-CM

## 2013-09-12 DIAGNOSIS — K1379 Other lesions of oral mucosa: Secondary | ICD-10-CM

## 2013-09-12 LAB — CBC WITH DIFFERENTIAL/PLATELET
Eosinophils Relative: 2.3 % (ref 0.0–5.0)
Lymphocytes Relative: 30.2 % (ref 12.0–46.0)
MCV: 96.4 fl (ref 78.0–100.0)
Monocytes Absolute: 0.4 10*3/uL (ref 0.1–1.0)
Monocytes Relative: 12.2 % — ABNORMAL HIGH (ref 3.0–12.0)
Neutrophils Relative %: 54.8 % (ref 43.0–77.0)
Platelets: 131 10*3/uL — ABNORMAL LOW (ref 150.0–400.0)
WBC: 3.4 10*3/uL — ABNORMAL LOW (ref 4.5–10.5)

## 2013-09-12 NOTE — Progress Notes (Signed)
HPI: This is a    very pleasant 79 year old man whom I last saw in 2006 the time of colonoscopy he had no colon polyps but I did find a fairly good size pedunculated lipoma which I biopsied.  He has seen blood coming from his mouth (6 different times), small amount of blood on his pillow case.  "less than 1/2 tsp".  Mixed with saliva.  He had direct larygoscopy with Dr. Jearld Fenton, was a normal examination.    No vomiting, no coughing. Feels good actually.  Will have nose bleeds (2-3 times per year).  No nausea, no abd pains, no nsaids.  Has been gaining weight.  No pyrosis.     Review of systems: Pertinent positive and negative review of systems were noted in the above HPI section. Complete review of systems was performed and was otherwise normal.    Past Medical History  Diagnosis Date  . ALLERGIC RHINITIS 10/05/2007  . ANXIETY 10/05/2007  . COLONIC POLYPS, HX OF 10/05/2007  . DEPRESSION 10/04/2008  . PROSTATE CANCER, UNSPEC. 10/05/2007  . SINUS BRADYCARDIA 10/04/2008    Past Surgical History  Procedure Laterality Date  . Tonsillectomy    . Prostate surgery    . Nasal sinus surgery      Current Outpatient Prescriptions  Medication Sig Dispense Refill  . busPIRone (BUSPAR) 10 MG tablet Take 10 mg by mouth 2 (two) times daily.      . clonazePAM (KLONOPIN) 0.5 MG tablet Take by mouth. One tablet by mouth every morning and one tablet by mouth at bedtime.      Marland Kitchen escitalopram (LEXAPRO) 20 MG tablet Take 20 mg by mouth daily.      Marland Kitchen OLANZapine (ZYPREXA) 10 MG tablet Take 5 mg by mouth 2 (two) times daily.       No current facility-administered medications for this visit.    Allergies as of 09/12/2013  . (No Known Allergies)    Family History  Problem Relation Age of Onset  . Prostate cancer Father   . Breast cancer Sister     History   Social History  . Marital Status: Married    Spouse Name: N/A    Number of Children: 2  . Years of Education: N/A    Occupational History  . Retired    Social History Main Topics  . Smoking status: Former Smoker    Quit date: 10/13/1973  . Smokeless tobacco: Never Used  . Alcohol Use: 7.0 oz/week    14 drink(s) per week  . Drug Use: No  . Sexual Activity: Not on file   Other Topics Concern  . Not on file   Social History Narrative  . No narrative on file       Physical Exam: BP 128/80  Pulse 68  Ht 5' 9.25" (1.759 m)  Wt 211 lb 9.6 oz (95.981 kg)  BMI 31.02 kg/m2 Constitutional: generally well-appearing Psychiatric: alert and oriented x3 Eyes: extraocular movements intact Mouth: oral pharynx moist, no lesions Neck: supple no lymphadenopathy Cardiovascular: heart regular rate and rhythm Lungs: clear to auscultation bilaterally Abdomen: soft, nontender, nondistended, no obvious ascites, no peritoneal signs, normal bowel sounds Extremities: no lower extremity edema bilaterally Skin: no lesions on visible extremities    Assessment and plan: 79 y.o. male with   blood on pillow in the morning  he has seen a small spot of blood on the pillow case mixed with saliva 6 times in the past several months. He has no GI symptoms and  he has been gaining weight. He has no dysphasia. He has no nausea or vomiting. He has no GERD. Takes no NSAIDs. ENT evaluation was normal. I think it is very unlikely that he has any significant GI pathology. I am going to get a CBC today and if it is normal I feel comfortable simply following him clinically. If however he is anemic and I will see with EGD however I still think the GI tract is an unlikely source of the blood that he has been seen.

## 2013-09-12 NOTE — Patient Instructions (Addendum)
You will have labs checked today in the basement lab.  Please head down after you check out with the front desk  (cbc). Pending those results, you may be scheduled for upper endoscopy. If you notice vomiting of blood, you need to call.

## 2013-09-19 DIAGNOSIS — F429 Obsessive-compulsive disorder, unspecified: Secondary | ICD-10-CM | POA: Diagnosis not present

## 2013-09-22 DIAGNOSIS — F3342 Major depressive disorder, recurrent, in full remission: Secondary | ICD-10-CM | POA: Diagnosis not present

## 2013-10-12 ENCOUNTER — Ambulatory Visit (INDEPENDENT_AMBULATORY_CARE_PROVIDER_SITE_OTHER): Payer: Medicare Other | Admitting: Internal Medicine

## 2013-10-12 ENCOUNTER — Encounter: Payer: Self-pay | Admitting: Internal Medicine

## 2013-10-12 VITALS — BP 140/80 | HR 73 | Temp 98.0°F | Resp 20 | Ht 69.25 in | Wt 205.0 lb

## 2013-10-12 DIAGNOSIS — R002 Palpitations: Secondary | ICD-10-CM

## 2013-10-12 DIAGNOSIS — I495 Sick sinus syndrome: Secondary | ICD-10-CM | POA: Diagnosis not present

## 2013-10-12 DIAGNOSIS — J309 Allergic rhinitis, unspecified: Secondary | ICD-10-CM | POA: Diagnosis not present

## 2013-10-12 DIAGNOSIS — Z0000 Encounter for general adult medical examination without abnormal findings: Secondary | ICD-10-CM

## 2013-10-12 DIAGNOSIS — C61 Malignant neoplasm of prostate: Secondary | ICD-10-CM

## 2013-10-12 DIAGNOSIS — Z86010 Personal history of colonic polyps: Secondary | ICD-10-CM

## 2013-10-12 DIAGNOSIS — F329 Major depressive disorder, single episode, unspecified: Secondary | ICD-10-CM

## 2013-10-12 LAB — POCT URINALYSIS DIPSTICK
Bilirubin, UA: NEGATIVE
Ketones, UA: NEGATIVE
Nitrite, UA: NEGATIVE
pH, UA: 5.5

## 2013-10-12 LAB — COMPREHENSIVE METABOLIC PANEL
ALT: 44 U/L (ref 0–53)
CO2: 30 mEq/L (ref 19–32)
GFR: 72.45 mL/min (ref >60.00)
Potassium: 4.6 mEq/L (ref 3.5–5.1)
Sodium: 139 mEq/L (ref 135–145)
Total Bilirubin: 1.2 mg/dL (ref 0.3–1.2)
Total Protein: 7.2 g/dL (ref 6.0–8.3)

## 2013-10-12 LAB — TSH: TSH: 2.98 u[IU]/mL (ref 0.35–5.50)

## 2013-10-12 NOTE — Progress Notes (Signed)
   Subjective:    Patient ID: Gary Osborn, male    DOB: 12-09-1932, 79 y.o.   MRN: [id number]  HPI Wt Readings from Last 3 Encounters:  10/12/13 205 lb (92.987 kg)  09/12/13 211 lb 9.6 oz (95.981 kg)  08/08/13 211 lb (95.709 kg)     Review of Systems     Objective:   Physical Exam        Assessment & Plan:

## 2013-10-12 NOTE — Patient Instructions (Signed)
Limit your sodium (Salt) intake    It is important that you exercise regularly, at least 20 minutes 3 to 4 times per week.  If you develop chest pain or shortness of breath seek  medical attention.  You need to lose weight.  Consider a lower calorie diet and regular exercise.  Return in one year for follow-up 

## 2013-10-12 NOTE — Progress Notes (Signed)
Subjective:    Patient ID: Gary Osborn, male    DOB: 1932-11-21, 79 y.o.   MRN: [id number]  HPI   Subjective:    Patient ID: Gary Osborn, male    DOB: 31-May-1933, 79 y.o.   MRN: [id number]  HPIHistory of Present Illness:   79    year old patient who is in today for a health maintenance examination. He has a history of prostate cancer and is followed by urology. He is doing quite well today and has had a recent urological evaluation. He has a history also of colonic polyps   Here for Medicare AWV:   1. Risk factors based on Past M, S, F history: is factors include age  24. Physical Activities: remains quite active. walks 45 minutes  most days without exercise limitation  3. Depression/mood: remote history depression, which has been stable for some time. He is on chronic medications  4. Hearing: no significant deficit  5. ADL's: remains independent in all aspects of daily living  6. Fall Risk: low  7. Home Safety: no problem identified  8. Height, weight, &visual acuity:height and weight stable. No difficulty with visual acuity did have a recent eye examination. This past week  9. Counseling: heart healthy diet, exercise regimen, and modest weight loss. All encouraged  10. Labs ordered based on risk factors: laboratory profile, including PSA will be reviewed  11. Referral Coordination- follow-up urology, and GI  12. Care Plan- will need a colonoscopy, and urology follow-up  13. Cognitive Assessment- alert and oriented, with normal affect. No cognitive dysfunction   Allergies (verified):  No Known Drug Allergies   Past History:  Past Medical History:   Anxiety  Colonic polyps, hx of  history of prostate cancer  Allergic rhinitis  Depression  sinus bradycardia   Past Surgical History:   radical prostatectomy April 2008  Sinus surgery septoplasty June 2004  Tonsillectomy age 48  psychotherapy in the 70s  colonoscopy December 2006   Family History:   father died age  70, prostate cancer  mother died age 60  One sister, history of breast cancer   Social History:   two children, one daughter with  bipolar depression     Wt Readings from Last 3 Encounters:  10/08/11 207 lb (93.895 kg)  03/11/11 197 lb (89.359 kg)  02/10/11 193 lb (87.544 kg)     Review of Systems  Constitutional: Negative for fever, chills, activity change, appetite change and fatigue.  HENT: Negative for hearing loss, ear pain, congestion, rhinorrhea, sneezing, mouth sores, trouble swallowing, neck pain, neck stiffness, dental problem, voice change, sinus pressure and tinnitus.   Eyes: Negative for photophobia, pain, redness and visual disturbance.  Respiratory: Negative for apnea, cough, choking, chest tightness, shortness of breath and wheezing.   Cardiovascular: Negative for chest pain, palpitations and leg swelling.  Gastrointestinal: Negative for nausea, vomiting, abdominal pain, diarrhea, constipation, blood in stool, abdominal distention, anal bleeding and rectal pain.  Genitourinary: Negative for dysuria, urgency, frequency, hematuria, flank pain, decreased urine volume, discharge, penile swelling, scrotal swelling, difficulty urinating (Some urinary incontinence following prostatectomy), genital sores and testicular pain.  Musculoskeletal: Negative for myalgias, back pain, joint swelling, arthralgias and gait problem.  Skin: Negative for color change, rash and wound.  Neurological: Negative for dizziness, tremors, seizures, syncope, facial asymmetry, speech difficulty, weakness, light-headedness, numbness and headaches.  Hematological: Negative for adenopathy. Does not bruise/bleed easily.  Psychiatric/Behavioral: Negative for suicidal ideas, hallucinations, behavioral problems, confusion, sleep disturbance, self-injury, dysphoric  mood, decreased concentration and agitation. The patient is not nervous/anxious.        Objective:   Physical Exam  Constitutional: He appears  well-developed and well-nourished.  HENT:  Head: Normocephalic and atraumatic.  Right Ear: External ear normal.  Left Ear: External ear normal.  Nose: Nose normal.  Mouth/Throat: Oropharynx is clear and moist.  Eyes: Conjunctivae and EOM are normal. Pupils are equal, round, and reactive to light. No scleral icterus.  Neck: Normal range of motion. Neck supple. No JVD present. No thyromegaly present.  Cardiovascular: Regular rhythm, normal heart sounds and intact distal pulses.  Exam reveals no gallop and no friction rub.   No murmur heard. Pulmonary/Chest: Effort normal and breath sounds normal. He exhibits no tenderness.  Abdominal: Soft. Bowel sounds are normal. He exhibits no distension and no mass. There is no tenderness.  Genitourinary: Penis normal.  Musculoskeletal: Normal range of motion. He exhibits no edema and no tenderness.  Lymphadenopathy:    He has no cervical adenopathy.  Neurological: He is alert. He has normal reflexes. No cranial nerve deficit. Coordination normal.  Skin: Skin is warm and dry. No rash noted.  Psychiatric: He has a normal mood and affect. His behavior is normal.          Assessment & Plan:    Preventive health examination Status post prostatectomy Depression- stable Colonic polyps  Will check updated labs. Followup urology and GI as scheduled. Return here in one year or as needed   Review of Systems as above     Objective:   Physical Exam  Testicular atrophy       Assessment & Plan:   Preventive health examination History colonic polyps History Maj. depression. Continue psychiatric and psychology followup History of prostate cancer. Continue annual urology followup  More regular exercise recommended Recheck one year

## 2013-10-12 NOTE — Progress Notes (Signed)
Pre-visit discussion using our clinic review tool. No additional management support is needed unless otherwise documented below in the visit note.  

## 2013-10-17 DIAGNOSIS — F429 Obsessive-compulsive disorder, unspecified: Secondary | ICD-10-CM | POA: Diagnosis not present

## 2013-11-02 DIAGNOSIS — F3342 Major depressive disorder, recurrent, in full remission: Secondary | ICD-10-CM | POA: Diagnosis not present

## 2013-11-03 ENCOUNTER — Encounter (HOSPITAL_COMMUNITY): Payer: Self-pay | Admitting: Emergency Medicine

## 2013-11-03 ENCOUNTER — Emergency Department (HOSPITAL_COMMUNITY)
Admission: EM | Admit: 2013-11-03 | Discharge: 2013-11-05 | Disposition: A | Discharge status: Home / Self Care | Payer: Medicare Other | Attending: Emergency Medicine | Admitting: Emergency Medicine

## 2013-11-03 DIAGNOSIS — Z860100 Personal history of colon polyps, unspecified: Secondary | ICD-10-CM | POA: Insufficient documentation

## 2013-11-03 DIAGNOSIS — Z87891 Personal history of nicotine dependence: Secondary | ICD-10-CM | POA: Diagnosis not present

## 2013-11-03 DIAGNOSIS — Z79899 Other long term (current) drug therapy: Secondary | ICD-10-CM | POA: Insufficient documentation

## 2013-11-03 DIAGNOSIS — F411 Generalized anxiety disorder: Secondary | ICD-10-CM | POA: Diagnosis not present

## 2013-11-03 DIAGNOSIS — F3289 Other specified depressive episodes: Secondary | ICD-10-CM | POA: Diagnosis not present

## 2013-11-03 DIAGNOSIS — F329 Major depressive disorder, single episode, unspecified: Secondary | ICD-10-CM

## 2013-11-03 DIAGNOSIS — R45851 Suicidal ideations: Secondary | ICD-10-CM | POA: Diagnosis not present

## 2013-11-03 DIAGNOSIS — Z8709 Personal history of other diseases of the respiratory system: Secondary | ICD-10-CM | POA: Insufficient documentation

## 2013-11-03 DIAGNOSIS — Z8679 Personal history of other diseases of the circulatory system: Secondary | ICD-10-CM | POA: Diagnosis not present

## 2013-11-03 DIAGNOSIS — Z8546 Personal history of malignant neoplasm of prostate: Secondary | ICD-10-CM | POA: Insufficient documentation

## 2013-11-03 DIAGNOSIS — Z0000 Encounter for general adult medical examination without abnormal findings: Secondary | ICD-10-CM | POA: Diagnosis not present

## 2013-11-03 DIAGNOSIS — F29 Unspecified psychosis not due to a substance or known physiological condition: Secondary | ICD-10-CM | POA: Diagnosis not present

## 2013-11-03 LAB — RAPID URINE DRUG SCREEN, HOSP PERFORMED
AMPHETAMINES: NOT DETECTED
BARBITURATES: NOT DETECTED
BENZODIAZEPINES: POSITIVE — AB
COCAINE: NOT DETECTED
Opiates: NOT DETECTED
Tetrahydrocannabinol: NOT DETECTED

## 2013-11-03 LAB — CBC
HEMATOCRIT: 42.2 % (ref 39.0–52.0)
Hemoglobin: 14.3 g/dL (ref 13.0–17.0)
MCH: 32.9 pg (ref 26.0–34.0)
MCHC: 33.9 g/dL (ref 30.0–36.0)
MCV: 97.2 fL (ref 78.0–100.0)
PLATELETS: 149 10*3/uL — AB (ref 150–400)
RBC: 4.34 MIL/uL (ref 4.22–5.81)
RDW: 12.8 % (ref 11.5–15.5)
WBC: 4.6 10*3/uL (ref 4.0–10.5)

## 2013-11-03 LAB — COMPREHENSIVE METABOLIC PANEL
ALBUMIN: 4.2 g/dL (ref 3.5–5.2)
ALK PHOS: 86 U/L (ref 39–117)
ALT: 36 U/L (ref 0–53)
AST: 21 U/L (ref 0–37)
BILIRUBIN TOTAL: 0.7 mg/dL (ref 0.3–1.2)
BUN: 21 mg/dL (ref 6–23)
CHLORIDE: 105 meq/L (ref 96–112)
CO2: 25 mEq/L (ref 19–32)
Calcium: 8.9 mg/dL (ref 8.4–10.5)
Creatinine, Ser: 1 mg/dL (ref 0.50–1.35)
GFR calc Af Amer: 81 mL/min — ABNORMAL LOW (ref >90)
GFR calc non Af Amer: 70 mL/min — ABNORMAL LOW (ref >90)
Glucose, Bld: 86 mg/dL (ref 70–99)
POTASSIUM: 4.1 meq/L (ref 3.7–5.3)
SODIUM: 141 meq/L (ref 137–147)
TOTAL PROTEIN: 6.8 g/dL (ref 6.0–8.3)

## 2013-11-03 LAB — ACETAMINOPHEN LEVEL

## 2013-11-03 LAB — ETHANOL

## 2013-11-03 LAB — SALICYLATE LEVEL: Salicylate Lvl: <2 mg/dL — ABNORMAL LOW (ref 2.8–20.0)

## 2013-11-03 NOTE — ED Notes (Signed)
Pt brought to ER via [employer] for evaluation / pt states that he had some personal issues and wanted to end his life; pt had knife to his throat and was threatening himself and his wife; wife called 73 and they brought pt to ER; pt states that he SI denies HI

## 2013-11-03 NOTE — ED Provider Notes (Signed)
CSN: [id number]     Arrival date & time 11/03/13  2002 History   First MD Initiated Contact with Patient 11/03/13 2306     Chief Complaint  Patient presents with  . Medical Clearance   (Consider location/radiation/quality/duration/timing/severity/associated sxs/prior Treatment) HPI Patient is a 80 year old male with a history of depression and suicide attempt in the past. He is brought in by EMS after his wife called. Patient was holding a knife to his throat and threatening to kill himself. He states he still suicidal. He denies any homicidal ideation. He states he's been taking his depression medication as prescribed. He denies hallucinations, either auditory or visual. He states he's had some mild ongoing confusion. He denies any nausea vomiting or diarrhea. Denies any urinary symptoms. Patient denies any pain specifically chest pain, abdominal pain or headache. He has no focal weakness or numbness. Patient denies any visual changes. Past Medical History  Diagnosis Date  . ALLERGIC RHINITIS 10/05/2007  . ANXIETY 10/05/2007  . COLONIC POLYPS, HX OF 10/05/2007  . DEPRESSION 10/04/2008  . PROSTATE CANCER, UNSPEC. 10/05/2007  . 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 Tonsillectomy    . Prostate surgery    . Nasal sinus surgery     Family History  Problem Relation Age of Onset  . Prostate cancer Father   . Breast cancer Sister    History  Substance Use Topics  . Smoking status: Former Smoker    Quit date: 10/13/1973  . Smokeless tobacco: Never Used  . Alcohol Use: 7.0 oz/week    14 drink(s) per week    Review of Systems  Constitutional: Negative for fever and chills.  Eyes: Negative for visual disturbance.  Respiratory: Negative for cough and shortness of breath.   Cardiovascular: Negative for chest pain.  Gastrointestinal: Negative for nausea, vomiting, abdominal pain and diarrhea.  Genitourinary: Negative for dysuria, hematuria  and flank pain.  Musculoskeletal: Negative for arthralgias, myalgias, neck pain and neck stiffness.  Skin: Negative for rash and wound.  Neurological: Negative for dizziness, syncope, weakness, light-headedness, numbness and headaches.  Psychiatric/Behavioral: Positive for suicidal ideas and confusion. Negative for hallucinations.  All other systems reviewed and are negative.    Allergies  Review of patient's allergies indicates no known allergies.  Home Medications   Current Outpatient Rx  Name  Route  Sig  Dispense  Refill  . busPIRone (BUSPAR) 10 MG tablet   Oral   Take 10 mg by mouth 2 (two) times daily.         . clonazePAM (KLONOPIN) 0.5 MG tablet   Oral   Take 0.5 mg by mouth 2 (two) times daily.          Marland Kitchen escitalopram (LEXAPRO) 20 MG tablet   Oral   Take 20 mg by mouth daily.         Marland Kitchen OLANZapine (ZYPREXA) 5 MG tablet   Oral   Take 5 mg by mouth at bedtime.         . temazepam (RESTORIL) 15 MG capsule   Oral   Take 15 mg by mouth at bedtime as needed for sleep.          BP 167/81  Pulse 58  Temp(Src) 97.3 F (36.3 C) (Oral)  Resp 16  Ht 5\' 10"  (1.778 m)  Wt 190 lb (86.183 kg)  BMI 27.26 kg/m2  SpO2 98% Physical Exam  Nursing note and vitals reviewed. Constitutional: He is oriented to person, place,  and time. He appears well-developed and well-nourished. No distress.  HENT:  Head: Normocephalic and atraumatic.  Mouth/Throat: Oropharynx is clear and moist. No oropharyngeal exudate.  Eyes: EOM are normal. Pupils are equal, round, and reactive to light.  Neck: Normal range of motion. Neck supple.  No nuchal rigidity. No evidence of injury to the neck. No swelling or masses. Trachea is midline. No stridor  Cardiovascular: Normal rate and regular rhythm.   Pulmonary/Chest: Effort normal and breath sounds normal. No respiratory distress. He has no wheezes. He has no rales.  Abdominal: Soft. Bowel sounds are normal. He exhibits no distension and no  mass. There is no tenderness. There is no rebound and no guarding.  Musculoskeletal: Normal range of motion. He exhibits no edema and no tenderness.  Neurological: He is alert and oriented to person, place, and time.  5/5 motor in all extremities. Sensation grossly intact.  Skin: Skin is warm and dry. No rash noted. No erythema.  Psychiatric:  Admits to ongoing suicidal ideation    ED Course  Procedures (including critical care time) Labs Review Labs Reviewed  CBC - Abnormal; Notable for the following:    Platelets 149 (*)    All other components within normal limits  COMPREHENSIVE METABOLIC PANEL - Abnormal; Notable for the following:    GFR calc non Af Amer 70 (*)    GFR calc Af Amer 81 (*)    All other components within normal limits  SALICYLATE LEVEL - Abnormal; Notable for the following:    Salicylate Lvl <2.7 (*)    All other components within normal limits  URINE RAPID DRUG SCREEN (HOSP PERFORMED) - Abnormal; Notable for the following:    Benzodiazepines POSITIVE (*)    All other components within normal limits  ACETAMINOPHEN LEVEL  ETHANOL  URINALYSIS, ROUTINE W REFLEX MICROSCOPIC   Imaging Review No results found.  EKG Interpretation   None       MDM   Patient is medically cleared for psychiatric evaluation.   Julianne Rice, MD 11/04/13 [phone]

## 2013-11-04 ENCOUNTER — Emergency Department (HOSPITAL_COMMUNITY): Payer: Medicare Other

## 2013-11-04 DIAGNOSIS — F3289 Other specified depressive episodes: Secondary | ICD-10-CM | POA: Diagnosis not present

## 2013-11-04 DIAGNOSIS — Z0000 Encounter for general adult medical examination without abnormal findings: Secondary | ICD-10-CM | POA: Diagnosis not present

## 2013-11-04 DIAGNOSIS — F329 Major depressive disorder, single episode, unspecified: Secondary | ICD-10-CM | POA: Diagnosis not present

## 2013-11-04 LAB — URINALYSIS, ROUTINE W REFLEX MICROSCOPIC
Bilirubin Urine: NEGATIVE
GLUCOSE, UA: NEGATIVE mg/dL
Hgb urine dipstick: NEGATIVE
KETONES UR: NEGATIVE mg/dL
Leukocytes, UA: NEGATIVE
NITRITE: NEGATIVE
PH: 6.5 (ref 5.0–8.0)
PROTEIN: NEGATIVE mg/dL
Specific Gravity, Urine: 1.028 (ref 1.005–1.030)
Urobilinogen, UA: 1 mg/dL (ref 0.0–1.0)

## 2013-11-04 MED ORDER — ESCITALOPRAM OXALATE 10 MG PO TABS
20.0000 mg | ORAL_TABLET | Freq: Every day | ORAL | Status: DC
  Administered 2013-11-04 – 2013-11-05 (×2): 20 mg via ORAL
  Filled 2013-11-04 (×2): qty 2

## 2013-11-04 MED ORDER — OLANZAPINE 5 MG PO TABS
5.0000 mg | ORAL_TABLET | Freq: Every day | ORAL | Status: DC
  Administered 2013-11-04: 5 mg via ORAL
  Filled 2013-11-04: qty 1

## 2013-11-04 MED ORDER — CLONAZEPAM 0.5 MG PO TABS
0.5000 mg | ORAL_TABLET | Freq: Two times a day (BID) | ORAL | Status: DC
  Administered 2013-11-04 – 2013-11-05 (×4): 0.5 mg via ORAL
  Filled 2013-11-04 (×4): qty 1

## 2013-11-04 MED ORDER — TEMAZEPAM 15 MG PO CAPS: 15.0000 mg | ORAL_CAPSULE | Freq: Every evening | ORAL | Status: DC | PRN

## 2013-11-04 MED ORDER — BUSPIRONE HCL 10 MG PO TABS
10.0000 mg | ORAL_TABLET | Freq: Two times a day (BID) | ORAL | Status: DC
  Administered 2013-11-04 – 2013-11-05 (×4): 10 mg via ORAL
  Filled 2013-11-04 (×4): qty 1

## 2013-11-04 NOTE — ED Notes (Signed)
Patient escorted to room 39 and oriented to unit.  Is confused and requesting assistance with diaper change. Offered shower, but has thought blocking. Continue to assess.

## 2013-11-04 NOTE — ED Notes (Signed)
Difficult to redirect. Unable to follow simple commands.

## 2013-11-04 NOTE — ED Notes (Signed)
Patient continues to be thought blocking and confused.  1:1 initiated for fall risk and urinary frequency.  Fluids offered.

## 2013-11-04 NOTE — BH Assessment (Signed)
Assessment Note  Gary Osborn is a 80 y.o. male who presents voluntarily to [facility] with Delusional behavior and SI.  Pt brought on [employer]/[facility]S.  Pt.'s wife called EMS because pt was holding a knife to his throat, threatening to kill himself and threatening to kill his wife(per nurse's note).  Pt continues to endorse SI with plan to this Probation officer.  This Probation officer observed the following: pt is restless and confused at times when questions are posed, unsure of how to answer.  Pt pauses for several minutes before answering questions; he has disorganized thoughts and is tangential.  Pt told this Probation officer he fears for his wife and son because of controversial subjects such as materials from nazi [place] and other [employer], says--" everything is seemingly coming apart".  Pt says he's done things that are too complex to explain.  Pt denies AVH.  Pt says he's spent quite of bit of time in psych hospitals in the '70's.   This Probation officer called pt.'s spouse for collateral information, pt unable to provide any additional information.  Pt spouse reports the following: pt was admitted to [facility] in 2013 for SI attempt by overdosing on all his medication.  Pt has been exhibiting delusional behavior---pt told his wife that he has self-published books that doomed him and he subscribes to an [place] magazine that he feels he will be punished for their propaganda.  Spouse states pt did donate reading materials and books to [employer]).  Spouse told this Probation officer that she found patient destroying pictures of himself because he didn't want his them in newspapers or magazines as evidence that he was publishing negative writings.  Pt has hx of mental illness dating back to college years per spouse.  This Probation officer discussed disposition with Dr. Lita Mains, requesting EKG/chest x-ray.  Pt will require inpt admission in geri psych facility and possible IVC petition.     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 ALLERGIC RHINITIS 10/05/2007  . ANXIETY 10/05/2007  . COLONIC POLYPS, HX OF 10/05/2007  . DEPRESSION 10/04/2008  . PROSTATE CANCER, UNSPEC. 10/05/2007  . SINUS BRADYCARDIA 10/04/2008   Axis IV: other psychosocial or environmental problems, problems related to social environment and problems with primary support group Axis V: 21-30 behavior considerably influenced by delusions or hallucinations OR serious impairment in judgment, communication OR inability to function in almost all areas  Past Medical History:  Past Medical History  Diagnosis Date  . ALLERGIC RHINITIS 10/05/2007  . ANXIETY 10/05/2007  . COLONIC POLYPS, HX OF 10/05/2007  . DEPRESSION 10/04/2008  . PROSTATE CANCER, UNSPEC. 10/05/2007  . SINUS BRADYCARDIA 10/04/2008    Past Surgical History  Procedure Laterality Date  . Tonsillectomy    . Prostate surgery    . Nasal sinus surgery      Family History:  Family History  Problem Relation Age of Onset  . Prostate cancer Father   . Breast cancer Sister     Social History:  reports that he quit smoking about 40 years ago. He has never used smokeless tobacco. He reports that he drinks about 7.0 ounces of alcohol per week. He reports that he does not use illicit drugs.  Additional Social History:  Alcohol / Drug Use Pain Medications: See MAR Prescriptions: See MAR Over the Counter: See MAR History of alcohol / drug use?: Yes Longest period of sobriety (when/how long): None  Withdrawal Symptoms: Other (Comment) Substance #  1 Name of Substance 1: Alcohol--Wine  1 - Age of First Use: Teens  1 - Amount (size/oz): 1-2 Glasses  1 - Frequency: Daily  1 - Duration: On-going  1 - Last Use / Amount: Unk   CIWA: CIWA-Ar BP: 167/81 mmHg Pulse Rate: 58 COWS:    Allergies: No Known Allergies  Home Medications:  (Not in a hospital admission)  OB/GYN Status:  No LMP for male patient.  General Assessment  Data Location of Assessment: [facility] Is this a Tele or Face-to-Face Assessment?: Tele Assessment Is this an Initial Assessment or a Re-assessment for this encounter?: Initial Assessment Living Arrangements: Spouse/significant other Can pt return to current living arrangement?: Yes Admission Status: Voluntary Is patient capable of signing voluntary admission?: No Transfer from: [facility] Referral Source: MD  Medical Screening Exam ([place]) Medical Exam completed: No Reason for MSE not completed: Other: (None )  [facility] Crisis Care Plan Living Arrangements: Spouse/significant other Name of Psychiatrist: Dr. Casimiro Needle  Name of Therapist: Dr Dickenson--Psychologist   Education Status Is patient currently in school?: No Current Grade: None  Highest grade of school patient has completed: None  Name of school: None  Contact person: None   Risk to self Suicidal Ideation: Yes-Currently Present Suicidal Intent: Yes-Currently Present Is patient at risk for suicide?: Yes Suicidal Plan?: Yes-Currently Present Specify Current Suicidal Plan: Cut throat with a knife  Access to Means: Yes Specify Access to Suicidal Means: Knife, Sharps  What has been your use of drugs/alcohol within the last 12 months?: Pt denies--social drinker, 1-2 glasses of wine daily  Previous Attempts/Gestures: Yes How many times?: 1 Other Self Harm Risks: None  Triggers for Past Attempts: Other (Comment) (Delusional behavior) Intentional Self Injurious Behavior: None Family Suicide History: No Recent stressful life event(s): Other (Comment) (Delusional behavior ) Persecutory voices/beliefs?: No Depression: Yes Depression Symptoms:  (None reported ) Substance abuse history and/or treatment for substance abuse?: No Suicide prevention information given to non-admitted patients: Not applicable  Risk to Others Homicidal Ideation: No Thoughts of Harm to Others: No Current Homicidal Intent: No Current  Homicidal Plan: No Access to Homicidal Means: No Identified Victim: None  History of harm to others?: No Assessment of Violence: None Noted Violent Behavior Description: None  Does patient have access to weapons?: No Criminal Charges Pending?: No Does patient have a court date: No  Psychosis Hallucinations: None noted Delusions: Persecutory;Unspecified  Mental Status Report Appear/Hygiene: Disheveled Eye Contact: Fair Motor Activity: Restlessness Speech: Tangential Level of Consciousness: Alert Mood: Suspicious;Apprehensive;Preoccupied Affect: Appropriate to circumstance;Apprehensive;Preoccupied Anxiety Level: None Thought Processes: Tangential;Flight of Ideas Judgement: Impaired Orientation: Person;Place Obsessive Compulsive Thoughts/Behaviors: None  Cognitive Functioning Concentration: Decreased Memory: Recent Intact;Remote Intact IQ: Average Insight: Poor Impulse Control: Poor Appetite: Fair Weight Loss: 0 Weight Gain: 0 Sleep: No Change Total Hours of Sleep: 5 Vegetative Symptoms: None  ADLScreening [facility] Assessment Services) Patient's cognitive ability adequate to safely complete daily activities?: Yes Patient able to express need for assistance with ADLs?: Yes Independently performs ADLs?: Yes (appropriate for developmental age)  Prior Inpatient Therapy Prior Inpatient Therapy: Yes Prior Therapy Dates: 2013 Prior Therapy Facilty/Provider(s): [facility]  Reason for Treatment: SI/Overdose/Delusional   Prior Outpatient Therapy Prior Outpatient Therapy: Yes Prior Therapy Dates: Current  Prior Therapy Facilty/Provider(s): Dr. Barrie Lyme  Reason for Treatment: Med Mgt/Therapy   ADL Screening (condition at time of admission) Patient's cognitive ability adequate to safely complete daily activities?: Yes Is the patient deaf or have difficulty hearing?: No Does the  patient have difficulty seeing, even when wearing glasses/contacts?: No Does the  patient have difficulty concentrating, remembering, or making decisions?: Yes Patient able to express need for assistance with ADLs?: Yes Does the patient have difficulty dressing or bathing?: No Independently performs ADLs?: Yes (appropriate for developmental age) Does the patient have difficulty walking or climbing stairs?: No Weakness of Legs: None Weakness of Arms/Hands: None  Home Assistive Devices/Equipment Home Assistive Devices/Equipment: None  Therapy Consults (therapy consults require a physician order) PT Evaluation Needed: No OT Evalulation Needed: No SLP Evaluation Needed: No Abuse/Neglect Assessment (Assessment to be complete while patient is alone) Physical Abuse: Denies Verbal Abuse: Denies Sexual Abuse: Denies Exploitation of patient/patient's resources: Denies Self-Neglect: Denies Values / Beliefs Cultural Requests During Hospitalization: None Spiritual Requests During Hospitalization: None Consults Spiritual Care Consult Needed: No Social Work Consult Needed: No Regulatory affairs officer (For Healthcare) Advance Directive: Patient does not have advance directive;Patient would not like information (Unk ) Pre-existing out of facility DNR order (yellow form or pink MOST form): No Nutrition Screen- [facility] Patient's home diet: Regular  Additional Information 1:1 In Past 12 Months?: No CIRT Risk: No Elopement Risk: No Does patient have medical clearance?: Yes     Disposition:  Disposition Initial Assessment Completed for this Encounter: Yes Disposition of Patient: Inpatient treatment program;Referred to [facility] ) Type of inpatient treatment program: Adult Patient referred to: Other (Comment) ([facility] )  On Site Evaluation by:   Reviewed with Physician:    Girtha Rm 11/04/2013 6:03 AM

## 2013-11-04 NOTE — ED Notes (Signed)
Reported patient status to [facility] including confusion/disorientation, fall risk and need for diaper. Given instruction to accept patient to unit.  Report taken and belongings secured.

## 2013-11-04 NOTE — ED Notes (Signed)
Pt alert to self, situation and place.  Pt sitting up in bed.  Pt deneis HI/AH and VH.  Pt denies HI.   Pt reports SI with "unsuccesful attempt at walking in front of car or using a knife."  Pt reports "a lot of big things make me feel depressed."  Pt calm and cooperative at this time

## 2013-11-04 NOTE — Progress Notes (Signed)
Per discussion with psychiatrist and NP, patient recommended for inpatient geriatric psych treatment.   CSW referred patient to [facility].  CSW left message for [facility].  CSW left message for [facility] [phone].   Noreene Larsson [phone]  ED CSW .11/04/2013 1311pm

## 2013-11-04 NOTE — ED Notes (Signed)
Ordering provider for 1:1 is Charmaine Downs NP. Charting error on for mentioned ordering provider.

## 2013-11-04 NOTE — ED Notes (Signed)
Went to pt's room to introduce myself and pt appears very confused. Pt's thoughts are disorganized and hard to follow. Pt verbalized that he cannot dress himself and does not know how to change his diaper. Pt was oriented to place and was able to verbalize that he tried to commit suicide, but pt's facial expressions and demeanor did not match what pt was verbalizing. Pt's nurse was made aware.

## 2013-11-04 NOTE — ED Notes (Signed)
New diaper given.  Difficult to redirect.

## 2013-11-04 NOTE — Consult Note (Signed)
[facility] Face-to-Face Psychiatry Consult   Reason for Consult:  Major depression, Delusional d/o Referring Physician:  EDP Gary Osborn is an 80 y.o. male.  Assessment: AXIS I:  Anxiety Disorder NOS, Major Depression, Recurrent severe and Suicide attempt AXIS II:  Deferred AXIS III:   Past Medical History  Diagnosis Date  . ALLERGIC RHINITIS 10/05/2007  . ANXIETY 10/05/2007  . COLONIC POLYPS, HX OF 10/05/2007  . DEPRESSION 10/04/2008  . PROSTATE CANCER, UNSPEC. 10/05/2007  . SINUS BRADYCARDIA 10/04/2008   AXIS IV:  other psychosocial or environmental problems and problems related to social environment AXIS V:  21-30 behavior considerably influenced by delusions or hallucinations OR serious impairment in judgment, communication OR inability to function in almost all areas  Plan:  Recommend psychiatric Inpatient admission when medically cleared.  Subjective:   Gary Osborn is a 80 y.o. male patient admitted with Major depressive d/o recurrent, severe, suicide attempt and Paranoia  HPI: Patient was evaluated this am with Doctor Gary Osborn.  Patient was brought in by Gary Osborn and his wife after his wife found him holding a knife to his neck.  This morning during this interview, patient is still endorsing suicide and feels hopeless, worthless and helpless.  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  According to documented collateral information from wife, patient did not write or publish a book.  Patient states " I have made many wrong decisions in my life that did not turn out well" He did not explain the decisions he made that is a stressor for him now.  Patient admits to Psychiatric hospitalizations in the 1970's for surgical treatment of OCD.  He also admits to previous suicide attempt by OD 2 years ago.  Patient lost a daughter 3 years ago  Who committed suicide.  He rates his depression 10/10 and his anxiety 10/10.  He reports poor sleep  and poor appetite.  Patient repeated several times" I made several mistakes in my life".  We have accepted him for admission but will seek placement at a Geriatric facility.  Meanwhile we will continue to monitor and provide him safety.  HPI Elements:   Location:  [facility]. Quality:  SEVERE, ATTEMPTED SUICIDE WITH A KNIFE TO HIS NECK. Severity:  SEVERE. Context:  Depression and Paranoia.  Past Psychiatric History: Past Medical History  Diagnosis Date  . ALLERGIC RHINITIS 10/05/2007  . ANXIETY 10/05/2007  . COLONIC POLYPS, HX OF 10/05/2007  . DEPRESSION 10/04/2008  . PROSTATE CANCER, UNSPEC. 10/05/2007  . 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 He has never used smokeless tobacco. He reports that he drinks about 7.0 ounces of alcohol per week. He reports that he does not use illicit drugs. Family History  Problem Relation Age of Onset  . Prostate cancer Father   . Breast cancer Sister    Family History Substance Abuse: No Family Supports: Yes, List: (Spouse ) Living Arrangements: Spouse/significant other Can pt return to current living arrangement?: Yes Abuse/Neglect Gary Osborn) Physical Abuse: Denies Verbal Abuse: Denies Sexual Abuse: Denies Allergies:  No Known Allergies  ACT Assessment Complete:  Yes:    Educational Status    Risk to Self: Risk to self Suicidal Ideation: Yes-Currently Present Suicidal Intent: Yes-Currently Present Is patient at risk for suicide?: Yes Suicidal Plan?: Yes-Currently Present Specify Current Suicidal Plan: Cut throat with a knife  Access to Means: Yes Specify Access to Suicidal Means: Knife, Sharps  What  has been your use of drugs/alcohol within the last 12 months?: Pt denies--social drinker, 1-2 glasses of wine daily  Previous Attempts/Gestures: Yes How many times?: 1 Other Self Harm Risks: None  Triggers for Past Attempts: Other (Comment) (Delusional behavior) Intentional Self Injurious Behavior:  None Family Suicide History: No Recent stressful life event(s): Other (Comment) (Delusional behavior ) Persecutory voices/beliefs?: No Depression: Yes Depression Symptoms:  (None reported ) Substance abuse history and/or treatment for substance abuse?: No Suicide prevention information given to non-admitted patients: Not applicable  Risk to Others: Risk to Others Homicidal Ideation: No Thoughts of Harm to Others: No Current Homicidal Intent: No Current Homicidal Plan: No Access to Homicidal Means: No Identified Victim: None  History of harm to others?: No Assessment of Violence: None Noted Violent Behavior Description: None  Does patient have access to weapons?: No Criminal Charges Pending?: No Does patient have a court date: No  Abuse: Abuse/Neglect Assessment (Assessment to be complete while patient is alone) Physical Abuse: Denies Verbal Abuse: Denies Sexual Abuse: Denies Exploitation of patient/patient's resources: Denies Self-Neglect: Denies  Prior Inpatient Therapy: Prior Inpatient Therapy Prior Inpatient Therapy: Yes Prior Therapy Dates: 2013 Prior Therapy Facilty/Provider(s): Gary Osborn  Reason for Treatment: SI/Overdose/Delusional   Prior Outpatient Therapy: Prior Outpatient Therapy Prior Outpatient Therapy: Yes Prior Therapy Dates: Current  Prior Therapy Facilty/Provider(s): Gary Osborn  Reason for Treatment: Med Mgt/Therapy   Additional Information: Additional Information 1:1 In Past 12 Months?: No CIRT Risk: No Elopement Risk: No Does patient have medical clearance?: Yes                  Objective: Blood pressure 150/91, pulse 81, temperature 97.9 F (36.6 C), temperature source Oral, resp. rate 18, height $RemoveBe'5\' 10"'[id number]$  (1.778 m), weight 86.183 kg (190 lb), SpO2 95.00%.Body mass index is 27.26 kg/(m^2). Results for orders placed during the hospital encounter of 11/03/13 (from the past 72 hour(s))  URINE RAPID DRUG SCREEN (HOSP  PERFORMED)     Status: Abnormal   Collection Time    11/03/13  9:31 PM      Result Value Range   Opiates NONE DETECTED  NONE DETECTED   Cocaine NONE DETECTED  NONE DETECTED   Benzodiazepines POSITIVE (*) NONE DETECTED   Amphetamines NONE DETECTED  NONE DETECTED   Tetrahydrocannabinol NONE DETECTED  NONE DETECTED   Barbiturates NONE DETECTED  NONE DETECTED   Comment:            DRUG SCREEN FOR MEDICAL PURPOSES     ONLY.  IF CONFIRMATION IS NEEDED     FOR ANY PURPOSE, NOTIFY LAB     WITHIN 5 DAYS.                LOWEST DETECTABLE LIMITS     FOR URINE DRUG SCREEN     Drug Class       Cutoff (ng/mL)     Amphetamine      1000     Barbiturate      200     Benzodiazepine   638     Tricyclics       466     Opiates          300     Cocaine          300     THC              50  ACETAMINOPHEN LEVEL     Status: None   Collection Time  11/03/13  9:40 PM      Result Value Range   Acetaminophen (Tylenol), Serum <15.0  10 - 30 ug/mL   Comment:            THERAPEUTIC CONCENTRATIONS VARY     SIGNIFICANTLY. A RANGE OF 10-30     ug/mL MAY BE AN EFFECTIVE     CONCENTRATION FOR MANY PATIENTS.     HOWEVER, SOME ARE BEST TREATED     AT CONCENTRATIONS OUTSIDE THIS     RANGE.     ACETAMINOPHEN CONCENTRATIONS     >150 ug/mL AT 4 HOURS AFTER     INGESTION AND >50 ug/mL AT 12     HOURS AFTER INGESTION ARE     OFTEN ASSOCIATED WITH TOXIC     REACTIONS.  CBC     Status: Abnormal   Collection Time    11/03/13  9:40 PM      Result Value Range   WBC 4.6  4.0 - 10.5 K/uL   RBC 4.34  4.22 - 5.81 MIL/uL   Hemoglobin 14.3  13.0 - 17.0 g/dL   HCT 42.2  39.0 - 52.0 %   MCV 97.2  78.0 - 100.0 fL   MCH 32.9  26.0 - 34.0 pg   MCHC 33.9  30.0 - 36.0 g/dL   RDW 12.8  11.5 - 15.5 %   Platelets 149 (*) 150 - 400 K/uL  COMPREHENSIVE METABOLIC PANEL     Status: Abnormal   Collection Time    11/03/13  9:40 PM      Result Value Range   Sodium 141  137 - 147 mEq/L   Potassium 4.1  3.7 - 5.3 mEq/L    Chloride 105  96 - 112 mEq/L   CO2 25  19 - 32 mEq/L   Glucose, Bld 86  70 - 99 mg/dL   BUN 21  6 - 23 mg/dL   Creatinine, Ser 1.00  0.50 - 1.35 mg/dL   Calcium 8.9  8.4 - 10.5 mg/dL   Total Protein 6.8  6.0 - 8.3 g/dL   Albumin 4.2  3.5 - 5.2 g/dL   AST 21  0 - 37 U/L   ALT 36  0 - 53 U/L   Alkaline Phosphatase 86  39 - 117 U/L   Total Bilirubin 0.7  0.3 - 1.2 mg/dL   GFR calc non Af Amer 70 (*) >90 mL/min   GFR calc Af Amer 81 (*) >90 mL/min   Comment: (NOTE)     The eGFR has been calculated using the CKD EPI equation.     This calculation has not been validated in all clinical situations.     eGFR's persistently <90 mL/min signify possible Chronic Kidney     Disease.  ETHANOL     Status: None   Collection Time    11/03/13  9:40 PM      Result Value Range   Alcohol, Ethyl (B) <11  0 - 11 mg/dL   Comment:            LOWEST DETECTABLE LIMIT FOR     SERUM ALCOHOL IS 11 mg/dL     FOR MEDICAL PURPOSES ONLY  SALICYLATE LEVEL     Status: Abnormal   Collection Time    11/03/13  9:40 PM      Result Value Range   Salicylate Lvl <1.3 (*) 2.8 - 20.0 mg/dL  URINALYSIS, ROUTINE W REFLEX MICROSCOPIC     Status: None   Collection  Time    11/04/13  2:50 AM      Result Value Range   Color, Urine YELLOW  YELLOW   APPearance CLEAR  CLEAR   Specific Gravity, Urine 1.028  1.005 - 1.030   pH 6.5  5.0 - 8.0   Glucose, UA NEGATIVE  NEGATIVE mg/dL   Hgb urine dipstick NEGATIVE  NEGATIVE   Bilirubin Urine NEGATIVE  NEGATIVE   Ketones, ur NEGATIVE  NEGATIVE mg/dL   Protein, ur NEGATIVE  NEGATIVE mg/dL   Urobilinogen, UA 1.0  0.0 - 1.0 mg/dL   Nitrite NEGATIVE  NEGATIVE   Leukocytes, UA NEGATIVE  NEGATIVE   Comment: MICROSCOPIC NOT DONE ON URINES WITH NEGATIVE PROTEIN, BLOOD, LEUKOCYTES, NITRITE, OR GLUCOSE <1000 mg/dL.   Labs are reviewed and are pertinent for Unremarkable lab results..  Current Facility-Administered Medications  Medication Dose Route Frequency Provider Last Rate Last  Dose  . busPIRone (BUSPAR) tablet 10 mg  10 mg Oral BID Julianne Rice, MD   10 mg at 11/04/13 1017  . clonazePAM (KLONOPIN) tablet 0.5 mg  0.5 mg Oral BID Julianne Rice, MD   0.5 mg at 11/04/13 1017  . escitalopram (LEXAPRO) tablet 20 mg  20 mg Oral Daily Julianne Rice, MD   20 mg at 11/04/13 1017  . OLANZapine (ZYPREXA) tablet 5 mg  5 mg Oral QHS Julianne Rice, MD      . temazepam (RESTORIL) capsule 15 mg  15 mg Oral QHS PRN Julianne Rice, MD       Current Outpatient Prescriptions  Medication Sig Dispense Refill  . busPIRone (BUSPAR) 10 MG tablet Take 10 mg by mouth 2 (two) times daily.      . clonazePAM (KLONOPIN) 0.5 MG tablet Take 0.5 mg by mouth 2 (two) times daily.       Marland Kitchen escitalopram (LEXAPRO) 20 MG tablet Take 20 mg by mouth daily.      Marland Kitchen OLANZapine (ZYPREXA) 5 MG tablet Take 5 mg by mouth at bedtime.      . temazepam (RESTORIL) 15 MG capsule Take 15 mg by mouth at bedtime as needed for sleep.        Psychiatric Specialty Exam:     Blood pressure 150/91, pulse 81, temperature 97.9 F (36.6 C), temperature source Oral, resp. rate 18, height $RemoveBe'5\' 10"'zHvmPHZLT$  (1.778 m), weight 86.183 kg (190 lb), SpO2 95.00%.Body mass index is 27.26 kg/(m^2).  General Appearance: Casual and Fairly Groomed  Engineer, water::  Fair  Speech:  Clear and Coherent and Normal Rate  Volume:  Normal  Mood:  Anxious, Depressed, Dysphoric, Hopeless, Worthless and helpless  Affect:  Congruent, Depressed and Flat  Thought Process:  Coherent and Intact  Orientation:  Full (Time, Place, and Person)  Thought Content:  Paranoid Ideation  Suicidal Thoughts:  Yes.  with intent/plan  Homicidal Thoughts:  No  Memory:  Immediate;   Good Recent;   Fair Remote;   Fair  Judgement:  Fair  Insight:  Fair  Psychomotor Activity:  Decreased  Concentration:  Good  Recall:  Fair  Akathisia:  NA  Handed:  Right  AIMS (if indicated):     Assets:  Desire for Improvement  Sleep:      Treatment Plan Summary:  Consult and  face to face interview with Dr Gary Osborn Patient will referred to other facilities with Geropsychiatry care We will continue to provide care and safety for patient. Daily contact with patient to assess and evaluate symptoms and progress in treatment Medication management  Gary Osborn,  Wynona Meals  PMHNP-BC 11/04/2013 1:45 PM

## 2013-11-04 NOTE — ED Notes (Signed)
Pt was able to ambulate and walk to the bathroom with no assistance.  Pt sts to writer that he wears depends due to him having leakage with urine and does not want to have an accidents.  Pt sts to Probation officer that he wanted to wash up and Probation officer said that was fine.  Pt was able to perform his ADL in the bathroom while taking a shower.

## 2013-11-05 DIAGNOSIS — F332 Major depressive disorder, recurrent severe without psychotic features: Secondary | ICD-10-CM | POA: Diagnosis not present

## 2013-11-05 DIAGNOSIS — R45850 Homicidal ideations: Secondary | ICD-10-CM | POA: Diagnosis not present

## 2013-11-05 DIAGNOSIS — I498 Other specified cardiac arrhythmias: Secondary | ICD-10-CM | POA: Diagnosis present

## 2013-11-05 DIAGNOSIS — R45851 Suicidal ideations: Secondary | ICD-10-CM | POA: Diagnosis not present

## 2013-11-05 DIAGNOSIS — R030 Elevated blood-pressure reading, without diagnosis of hypertension: Secondary | ICD-10-CM | POA: Diagnosis not present

## 2013-11-05 DIAGNOSIS — F411 Generalized anxiety disorder: Secondary | ICD-10-CM | POA: Diagnosis present

## 2013-11-05 DIAGNOSIS — R946 Abnormal results of thyroid function studies: Secondary | ICD-10-CM | POA: Diagnosis not present

## 2013-11-05 DIAGNOSIS — F3289 Other specified depressive episodes: Secondary | ICD-10-CM | POA: Diagnosis not present

## 2013-11-05 DIAGNOSIS — F333 Major depressive disorder, recurrent, severe with psychotic symptoms: Secondary | ICD-10-CM | POA: Diagnosis present

## 2013-11-05 DIAGNOSIS — F605 Obsessive-compulsive personality disorder: Secondary | ICD-10-CM | POA: Diagnosis not present

## 2013-11-05 DIAGNOSIS — F429 Obsessive-compulsive disorder, unspecified: Secondary | ICD-10-CM | POA: Diagnosis present

## 2013-11-05 DIAGNOSIS — J309 Allergic rhinitis, unspecified: Secondary | ICD-10-CM | POA: Diagnosis present

## 2013-11-05 DIAGNOSIS — F329 Major depressive disorder, single episode, unspecified: Secondary | ICD-10-CM | POA: Diagnosis not present

## 2013-11-05 DIAGNOSIS — Z8546 Personal history of malignant neoplasm of prostate: Secondary | ICD-10-CM | POA: Diagnosis not present

## 2013-11-05 NOTE — [facility] Suicide Risk Assessment (Cosign Needed)
Suicide Risk Assessment  Discharge Assessment     Demographic Factors:  Age 80 or older, Caucasian, Unemployed and lost a daughter 3 years ago from suicide  Mental Status Per Nursing Assessment::   On Admission:     Current Mental Status by Physician: Suicide ideation indicated by: Patient, Suicide plan and Intention to act on suicide plan  Loss Factors: Loss of significant relationship and Financial problems/change in socioeconomic status  Historical Factors: Prior suicide attempts  Risk Reduction Factors:   Sense of responsibility to family, Religious beliefs about death and Living with another person, especially a relative  Continued Clinical Symptoms:  Depression:   Delusional Hopelessness Insomnia Previous Psychiatric Diagnoses and Treatments  Cognitive Features That Contribute To Risk:  Closed-mindedness Polarized thinking    Suicide Risk:  Severe:  Frequent, intense, and enduring suicidal ideation, specific plan, no subjective intent, but some objective markers of intent (i.e., choice of lethal method), the method is accessible, some limited preparatory behavior, evidence of impaired self-control, severe dysphoria/symptomatology, multiple risk factors present, and few if any protective factors, particularly a lack of social support.  Discharge Diagnoses:   AXIS I:  Anxiety Disorder NOS and Major Depression, Recurrent severe AXIS II:  Deferred AXIS III:   Past Medical History  Diagnosis Date  . ALLERGIC RHINITIS 10/05/2007  . ANXIETY 10/05/2007  . COLONIC POLYPS, HX OF 10/05/2007  . DEPRESSION 10/04/2008  . PROSTATE CANCER, UNSPEC. 10/05/2007  . SINUS BRADYCARDIA 10/04/2008   AXIS IV:  other psychosocial or environmental problems and problems related to social environment AXIS V:  31-40 impairment in reality testing  Plan Of Care/Follow-up recommendations:  Activity:  AS TOLERATED Diet:  Regular  Is patient on multiple antipsychotic therapies at  discharge:  No   Has Patient had three or more failed trials of antipsychotic monotherapy by history:  No  Recommended Plan for Multiple Antipsychotic Therapies: no NA  Charmaine Downs, C   PMHNPBC 11/05/2013, 3:41 PM

## 2013-11-05 NOTE — Progress Notes (Addendum)
1049 Received phone call from [place] at [facility], stated pt does meet their inpt criteria and is going to run by their psychiatrist for possible admission.  UPDATE: Received phone call back from [place] at [facility] stating that pt has been accepted pending IVC for transportation purposes d/t threatening to kill self.  If any questions stated she can be called directly [phone].  Will call [facility] to make aware.   Rick Duff Disposition [facility]

## 2013-11-05 NOTE — Progress Notes (Signed)
[facility] completed follow up with referral made by LCSW today.  The following hospitals were contacted:  1)[facility]-at capacity, didn't receive referral per Milta Deiters 2)[facility]-faxed referral at [facility] request [facility] asked to call in AM and speak to [facility] about bed availability.  Marene Lenz

## 2013-11-05 NOTE — Progress Notes (Signed)
Shirlee Limerick from [facility] called to give accepting MD, which is Dr. Geanie Kenning and report number is [phone] but must fax IVC findings and custody before he leaves. Will call pt's nurse to update.    Rick Duff Disposition [facility]

## 2013-11-05 NOTE — ED Notes (Signed)
Therapist, music here to transport to [facility]. Belongings bag x3 given to deputy. Ambulatory without difficulty. No complaints voiced. Denies pain. Called Zella Richer RN at [facility] to make her aware that they are in route.

## 2013-11-05 NOTE — ED Notes (Signed)
Pt was able to ambulate to the bathroom, pt just needs assistance with helping putting on the depends.  Pt was able to use the bathroom without no assistance from staff.

## 2013-11-05 NOTE — Progress Notes (Signed)
Patient ID: Gary Osborn, male   DOB: [birth year], 80 y.o.   MRN: [id number] Psychiatric Specialty Exam: Physical Exam  ROS  Blood pressure 157/88, pulse 64, temperature 98.1 F (36.7 C), temperature source Oral, resp. rate 18, height 5\' 10"  (1.778 m), weight 86.183 kg (190 lb), SpO2 93.00%.Body mass index is 27.26 kg/(m^2).  General Appearance: Casual and Fairly Groomed  Engineer, water::  Good  Speech:  Clear and Coherent and Normal Rate  Volume:  Normal  Mood:  Anxious and Depressed  Affect:  Congruent and Depressed  Thought Process:  Coherent and Goal Directed  Orientation:  Full (Time, Place, and Person)  Thought Content:  NA  Suicidal Thoughts:  No  Homicidal Thoughts:  No  Memory:  Immediate;   Good Recent;   Good Remote;   Fair  Judgement:  Fair  Insight:  Fair  Psychomotor Activity:  Normal  Concentration:  Good  Recall:  Good  Akathisia:  NA  Handed:  Right  AIMS (if indicated):     Assets:  Desire for Improvement  Sleep:      Saw patient this am who is awake, alert and oriented x 3.  Patient remembers the even that brought him to the hospital.  Patient reports good appetite but poor sleep due to the bright light in his room.  Patient, when asked what he will do at home with weapons around he replied " I will not attempt to do what I did few days ago.  I realized I could not do it since I was not able to complete killing myself few days ago with a knife"   Patient is happy that we will be sending him to some where to get help for his depression.  Patient denies SI/HI/AVH.    Continue with plan to send patient to Geropsychiatry unit when bed is available Continue to offer medications.  Charmaine Downs   PMHNP-BC

## 2013-11-05 NOTE — ED Notes (Addendum)
Report called to Zella Richer RN at [facility]. Accepted by Dr. Geanie Kenning. Called sheriffs dept to request transportation. Message left on machine.

## 2013-11-07 NOTE — Progress Notes (Signed)
Patient is depressed, overwhelmed needs inpatient treatment.

## 2013-11-07 NOTE — Consult Note (Signed)
Patient seen, examined by me, treatment plan and disposition decided by me. Patient is depressed, tearful, is hopeless and needs inpatient psychiatric care.

## 2013-11-21 DIAGNOSIS — F3342 Major depressive disorder, recurrent, in full remission: Secondary | ICD-10-CM | POA: Diagnosis not present

## 2013-11-22 ENCOUNTER — Telehealth: Payer: Self-pay | Admitting: Internal Medicine

## 2013-11-22 DIAGNOSIS — R002 Palpitations: Secondary | ICD-10-CM

## 2013-11-22 DIAGNOSIS — I495 Sick sinus syndrome: Secondary | ICD-10-CM

## 2013-11-22 NOTE — Telephone Encounter (Signed)
Spoke to pt told him his TSH was checked 12/31 and it was normal.  Pt said he was in the [facility] and was told to have TSH rechecked due to medication changes. Please advise if okay to recheck?

## 2013-11-22 NOTE — Telephone Encounter (Addendum)
Pt would like to get a order to have a thyroid test, Ok to schedule?

## 2013-11-22 NOTE — Telephone Encounter (Signed)
Spoke to pt told him okay to have TSH rechecked and I put the order in. Pt verbalized understanding.

## 2013-11-22 NOTE — Telephone Encounter (Signed)
Yes , please repeat TSH

## 2013-11-24 DIAGNOSIS — L570 Actinic keratosis: Secondary | ICD-10-CM | POA: Diagnosis not present

## 2013-11-24 DIAGNOSIS — K130 Diseases of lips: Secondary | ICD-10-CM | POA: Diagnosis not present

## 2013-11-24 DIAGNOSIS — D1801 Hemangioma of skin and subcutaneous tissue: Secondary | ICD-10-CM | POA: Diagnosis not present

## 2013-12-14 DIAGNOSIS — F429 Obsessive-compulsive disorder, unspecified: Secondary | ICD-10-CM | POA: Diagnosis not present

## 2013-12-20 DIAGNOSIS — F3342 Major depressive disorder, recurrent, in full remission: Secondary | ICD-10-CM | POA: Diagnosis not present

## 2013-12-27 ENCOUNTER — Other Ambulatory Visit (INDEPENDENT_AMBULATORY_CARE_PROVIDER_SITE_OTHER): Payer: Medicare Other

## 2013-12-27 DIAGNOSIS — I495 Sick sinus syndrome: Secondary | ICD-10-CM | POA: Diagnosis not present

## 2013-12-27 DIAGNOSIS — R002 Palpitations: Secondary | ICD-10-CM | POA: Diagnosis not present

## 2013-12-27 LAB — TSH: TSH: 1.44 u[IU]/mL (ref 0.35–5.50)

## 2014-01-04 ENCOUNTER — Ambulatory Visit (HOSPITAL_COMMUNITY)
Admission: RE | Admit: 2014-01-04 | Discharge: 2014-01-04 | Disposition: A | Discharge status: Home / Self Care | Payer: Medicare Other | Attending: Psychiatry | Admitting: Psychiatry

## 2014-01-04 NOTE — BH Assessment (Signed)
Tele Assessment Note   Gary Osborn is an 80 y.o. male with history of anxiety and depression. He presents to [facility] accompanied by his spouse. He was also referred to [facility] for an assessment by his psychiatric Dr. Marchelle Gearing. Patient denies SI, HI, and AVH's. He admits to a past history of SI and attempts/gestures. Sts that in January 2015 he attempted to cut his throat and also tried to jump into traffic. He was hospitalized at [facility] for these 2 attempts. He also reports 1 other prior admission at the age of 23 in [place]. That admission was due to OCD behaviors. Today the patient's main issue is excessive thoughts about his finances. Patient speaks of financial issues related to his taxes, bank accounts, and social security checks. Pt's spouse sts that he is delusional and making bad decisions. Apparently patient also gave a car away to a friend with no compensation. Also, he took this person to the bank to have the title of the car notarized. His spouse sts that taking people that the bank could put him at risk of making further bad decisions. Patient continues to have increased depression related to his depression. He is also highly anxious most days.   Writer discussed clinicals with Heloise Purpura, NP and he agreed that patient does not meet criteria for inpatient admission. Patient was referred back to his current psychiatrist-Dr. Plovosky and therapist-Bob Dickens.   Axis I: Obsessive Compulsive Disorder Nos and Anxiety Disorder Nos Axis II: Deferred Axis III:  Past Medical History  Diagnosis Date  . ALLERGIC RHINITIS 10/05/2007  . ANXIETY 10/05/2007  . COLONIC POLYPS, HX OF 10/05/2007  . DEPRESSION 10/04/2008  . PROSTATE CANCER, UNSPEC. 10/05/2007  . SINUS BRADYCARDIA 10/04/2008   Axis IV: other psychosocial or environmental problems, problems related to social environment, problems with access to health care services and problems with primary support group Axis V: 51-60 moderate  symptoms  Past Medical History:  Past Medical History  Diagnosis Date  . ALLERGIC RHINITIS 10/05/2007  . ANXIETY 10/05/2007  . COLONIC POLYPS, HX OF 10/05/2007  . DEPRESSION 10/04/2008  . PROSTATE CANCER, UNSPEC. 10/05/2007  . SINUS BRADYCARDIA 10/04/2008    Past Surgical History  Procedure Laterality Date  . Tonsillectomy    . Prostate surgery    . Nasal sinus surgery      Family History:  Family History  Problem Relation Age of Onset  . Prostate cancer Father   . Breast cancer Sister     Social History:  reports that he quit smoking about 40 years ago. He has never used smokeless tobacco. He reports that he drinks about 7.0 ounces of alcohol per week. He reports that he does not use illicit drugs.  Additional Social History:  Alcohol / Drug Use Pain Medications: SEE MAR Prescriptions: SEE MAR Over the Counter: SEE MAR History of alcohol / drug use?: No history of alcohol / drug abuse  CIWA:   COWS:    Allergies: No Known Allergies  Home Medications:  (Not in a hospital admission)  OB/GYN Status:  No LMP for male patient.  General Assessment Data Location of Assessment: [facility] Is this a Tele or Face-to-Face Assessment?: Face-to-Face Is this an Initial Assessment or a Re-assessment for this encounter?: Initial Assessment Living Arrangements: Spouse/significant other Can pt return to current living arrangement?: No Admission Status: Voluntary Is patient capable of signing voluntary admission?: Yes Transfer from: [facility] Referral Source: Self/Family/Friend     [place]  Living Arrangements: Spouse/significant other Name of Psychiatrist:  (Dr. Marchelle Gearing) Name of Therapist:  Aundra Dubin)  Education Status Is patient currently in school?: No  Risk to self Suicidal Ideation: No Suicidal Intent: No Is patient at risk for suicide?: No Suicidal Plan?: No Access to Means: No What has been your use of drugs/alcohol within  the last 12 months?:  (n/a) Previous Attempts/Gestures: Yes How many times?:  (2x's-thoughts with plans to overdose and run into traffic ) Other Self Harm Risks:  (none reported ) Triggers for Past Attempts: Other (Comment) Intentional Self Injurious Behavior: None Family Suicide History: Unknown Recent stressful life event(s): Other (Comment) Persecutory voices/beliefs?: No Depression: Yes Depression Symptoms: Feeling angry/irritable;Feeling worthless/self pity;Loss of interest in usual pleasures;Guilt;Fatigue;Isolating;Tearfulness;Insomnia;Despondent Substance abuse history and/or treatment for substance abuse?: No Suicide prevention information given to non-admitted patients: Not applicable  Risk to Others Homicidal Ideation: No Thoughts of Harm to Others: No Current Homicidal Intent: No Current Homicidal Plan: No Access to Homicidal Means: No Identified Victim:  (n/a) History of harm to others?: No Assessment of Violence: None Noted Violent Behavior Description:  (patient is calm and cooperative ) Does patient have access to weapons?: No Criminal Charges Pending?: No Does patient have a court date: No     Mental Status Report Appear/Hygiene: Other (Comment) (appropriate ) Eye Contact: Fair Motor Activity: Freedom of movement Speech: Logical/coherent Level of Consciousness: Alert Mood: Depressed Affect: Appropriate to circumstance Anxiety Level: None Thought Processes: Coherent;Relevant Judgement: Unimpaired Orientation: Person;Place;Situation;Time Obsessive Compulsive Thoughts/Behaviors: None  Cognitive Functioning Concentration: Decreased Memory: Recent Intact;Remote Intact IQ: Average Insight: Fair Impulse Control: Poor Appetite: Good Weight Loss:  (none reported ) Weight Gain:  (none reported ) Sleep: Decreased Total Hours of Sleep:  (varies ) Vegetative Symptoms: None  ADLScreening [facility] Assessment Services) Patient's cognitive ability adequate to  safely complete daily activities?: Yes Patient able to express need for assistance with ADLs?: Yes Independently performs ADLs?: Yes (appropriate for developmental age)  Prior Inpatient Therapy Prior Inpatient Therapy: Yes Prior Therapy Dates:  (January 2015-[facility]; Facilty in [place]) Prior Therapy Facilty/Provider(s):  ([facility] in [place] ) Reason for Treatment:  (OCD and suicidal ideations )  Prior Outpatient Therapy Prior Outpatient Therapy: Yes Prior Therapy Dates:  (current) Prior Therapy Facilty/Provider(s):  (Dr. Sheran Spine) Reason for Treatment:  (OCD, depression, anxiety, med mgmt. )  ADL Screening (condition at time of admission) Patient's cognitive ability adequate to safely complete daily activities?: Yes Is the patient deaf or have difficulty hearing?: No Does the patient have difficulty seeing, even when wearing glasses/contacts?: No Does the patient have difficulty concentrating, remembering, or making decisions?: No Patient able to express need for assistance with ADLs?: Yes Does the patient have difficulty dressing or bathing?: No Independently performs ADLs?: Yes (appropriate for developmental age) Does the patient have difficulty walking or climbing stairs?: No Weakness of Legs: None Weakness of Arms/Hands: None  Home Assistive Devices/Equipment Home Assistive Devices/Equipment: None    Abuse/Neglect Assessment (Assessment to be complete while patient is alone) Physical Abuse: Denies Verbal Abuse: Denies Sexual Abuse: Denies Exploitation of patient/patient's resources: Denies Self-Neglect: Denies Values / Beliefs Cultural Requests During Hospitalization: None Spiritual Requests During Hospitalization: None   Advance Directives (For Healthcare) Advance Directive: Patient does not have advance directive Nutrition Screen- [place] Adult/WL/AP Patient's home diet: Regular  Additional Information 1:1 In Past 12 Months?:  No CIRT Risk: No Elopement Risk: No Does patient have medical clearance?: Yes     Disposition:  Disposition  Initial Assessment Completed for this Encounter: Yes Disposition of Patient: Referred to (current providers-Dr. Gibson Ramp & Aundra Dubin)  [facility] 01/04/2014 7:26 PM

## 2014-01-05 ENCOUNTER — Inpatient Hospital Stay (HOSPITAL_COMMUNITY)
Admission: AD | Admit: 2014-01-05 | Discharge: 2014-01-11 | DRG: 885 | Disposition: A | Discharge status: Home / Self Care | Payer: Medicare Other | Source: Intra-hospital | Attending: Psychiatry | Admitting: Psychiatry

## 2014-01-05 ENCOUNTER — Emergency Department (EMERGENCY_DEPARTMENT_HOSPITAL)
Admission: EM | Admit: 2014-01-05 | Discharge: 2014-01-05 | Disposition: A | Discharge status: Other Acute Inpatient Hospital | Payer: Medicare Other | Source: Home / Self Care | Attending: Emergency Medicine | Admitting: Emergency Medicine

## 2014-01-05 ENCOUNTER — Encounter (HOSPITAL_COMMUNITY): Payer: Self-pay | Admitting: *Deleted

## 2014-01-05 ENCOUNTER — Encounter (HOSPITAL_COMMUNITY): Payer: Self-pay | Admitting: Emergency Medicine

## 2014-01-05 DIAGNOSIS — F411 Generalized anxiety disorder: Secondary | ICD-10-CM

## 2014-01-05 DIAGNOSIS — Z8546 Personal history of malignant neoplasm of prostate: Secondary | ICD-10-CM | POA: Insufficient documentation

## 2014-01-05 DIAGNOSIS — F333 Major depressive disorder, recurrent, severe with psychotic symptoms: Secondary | ICD-10-CM | POA: Diagnosis not present

## 2014-01-05 DIAGNOSIS — Z8042 Family history of malignant neoplasm of prostate: Secondary | ICD-10-CM | POA: Diagnosis not present

## 2014-01-05 DIAGNOSIS — Z87891 Personal history of nicotine dependence: Secondary | ICD-10-CM

## 2014-01-05 DIAGNOSIS — F429 Obsessive-compulsive disorder, unspecified: Secondary | ICD-10-CM

## 2014-01-05 DIAGNOSIS — Z5987 Material hardship due to limited financial resources, not elsewhere classified: Secondary | ICD-10-CM

## 2014-01-05 DIAGNOSIS — F332 Major depressive disorder, recurrent severe without psychotic features: Secondary | ICD-10-CM

## 2014-01-05 DIAGNOSIS — R45851 Suicidal ideations: Secondary | ICD-10-CM

## 2014-01-05 DIAGNOSIS — F3289 Other specified depressive episodes: Secondary | ICD-10-CM

## 2014-01-05 DIAGNOSIS — F329 Major depressive disorder, single episode, unspecified: Secondary | ICD-10-CM

## 2014-01-05 DIAGNOSIS — Z598 Other problems related to housing and economic circumstances: Secondary | ICD-10-CM | POA: Diagnosis not present

## 2014-01-05 DIAGNOSIS — F913 Oppositional defiant disorder: Secondary | ICD-10-CM | POA: Diagnosis not present

## 2014-01-05 DIAGNOSIS — Z8679 Personal history of other diseases of the circulatory system: Secondary | ICD-10-CM | POA: Insufficient documentation

## 2014-01-05 DIAGNOSIS — Z860100 Personal history of colon polyps, unspecified: Secondary | ICD-10-CM | POA: Insufficient documentation

## 2014-01-05 DIAGNOSIS — F32A Depression, unspecified: Secondary | ICD-10-CM

## 2014-01-05 DIAGNOSIS — Z79899 Other long term (current) drug therapy: Secondary | ICD-10-CM | POA: Insufficient documentation

## 2014-01-05 DIAGNOSIS — Z5989 Other problems related to housing and economic circumstances: Secondary | ICD-10-CM | POA: Diagnosis not present

## 2014-01-05 LAB — CBC
HCT: 41.7 % (ref 39.0–52.0)
Hemoglobin: 14.3 g/dL (ref 13.0–17.0)
MCH: 33.2 pg (ref 26.0–34.0)
MCHC: 34.3 g/dL (ref 30.0–36.0)
MCV: 96.8 fL (ref 78.0–100.0)
PLATELETS: 124 10*3/uL — AB (ref 150–400)
RBC: 4.31 MIL/uL (ref 4.22–5.81)
RDW: 12.7 % (ref 11.5–15.5)
WBC: 3.2 10*3/uL — ABNORMAL LOW (ref 4.0–10.5)

## 2014-01-05 LAB — ACETAMINOPHEN LEVEL: Acetaminophen (Tylenol), Serum: <15 ug/mL (ref 10–30)

## 2014-01-05 LAB — COMPREHENSIVE METABOLIC PANEL
ALBUMIN: 3.8 g/dL (ref 3.5–5.2)
ALT: 21 U/L (ref 0–53)
AST: 16 U/L (ref 0–37)
Alkaline Phosphatase: 85 U/L (ref 39–117)
BUN: 17 mg/dL (ref 6–23)
CO2: 24 meq/L (ref 19–32)
Calcium: 9.3 mg/dL (ref 8.4–10.5)
Chloride: 104 mEq/L (ref 96–112)
Creatinine, Ser: 1.06 mg/dL (ref 0.50–1.35)
GFR calc Af Amer: 75 mL/min — ABNORMAL LOW (ref >90)
GFR calc non Af Amer: 65 mL/min — ABNORMAL LOW (ref >90)
Glucose, Bld: 201 mg/dL — ABNORMAL HIGH (ref 70–99)
Potassium: 4.3 mEq/L (ref 3.7–5.3)
SODIUM: 140 meq/L (ref 137–147)
Total Bilirubin: 0.7 mg/dL (ref 0.3–1.2)
Total Protein: 6.5 g/dL (ref 6.0–8.3)

## 2014-01-05 LAB — ETHANOL: Alcohol, Ethyl (B): <11 mg/dL (ref 0–11)

## 2014-01-05 LAB — RAPID URINE DRUG SCREEN, HOSP PERFORMED
AMPHETAMINES: NOT DETECTED
BARBITURATES: NOT DETECTED
BENZODIAZEPINES: POSITIVE — AB
Cocaine: NOT DETECTED
Opiates: NOT DETECTED
Tetrahydrocannabinol: NOT DETECTED

## 2014-01-05 MED ORDER — OLANZAPINE 5 MG PO TABS: 15.0000 mg | ORAL_TABLET | Freq: Every day | ORAL | Status: DC

## 2014-01-05 MED ORDER — TEMAZEPAM 15 MG PO CAPS
15.0000 mg | ORAL_CAPSULE | Freq: Every evening | ORAL | Status: DC | PRN
  Administered 2014-01-05 – 2014-01-10 (×6): 15 mg via ORAL
  Filled 2014-01-05 (×6): qty 1

## 2014-01-05 MED ORDER — ALUM & MAG HYDROXIDE-SIMETH 200-200-20 MG/5ML PO SUSP: 30.0000 mL | ORAL | Status: DC | PRN

## 2014-01-05 MED ORDER — BUSPIRONE HCL 10 MG PO TABS
20.0000 mg | ORAL_TABLET | Freq: Three times a day (TID) | ORAL | Status: DC
  Administered 2014-01-06 – 2014-01-11 (×17): 20 mg via ORAL
  Filled 2014-01-05 (×14): qty 2
  Filled 2014-01-05: qty 4
  Filled 2014-01-05 (×5): qty 2
  Filled 2014-01-05: qty 4

## 2014-01-05 MED ORDER — BUSPIRONE HCL 10 MG PO TABS
20.0000 mg | ORAL_TABLET | Freq: Three times a day (TID) | ORAL | Status: DC
  Administered 2014-01-05: 20 mg via ORAL
  Filled 2014-01-05: qty 2

## 2014-01-05 MED ORDER — ACETAMINOPHEN 325 MG PO TABS: 650.0000 mg | ORAL_TABLET | ORAL | Status: DC | PRN

## 2014-01-05 MED ORDER — ONDANSETRON HCL 4 MG PO TABS: 4.0000 mg | ORAL_TABLET | Freq: Three times a day (TID) | ORAL | Status: DC | PRN

## 2014-01-05 MED ORDER — PAROXETINE HCL 20 MG PO TABS
20.0000 mg | ORAL_TABLET | Freq: Every day | ORAL | Status: DC
  Filled 2014-01-05: qty 1

## 2014-01-05 MED ORDER — OLANZAPINE 5 MG PO TABS
5.0000 mg | ORAL_TABLET | Freq: Every day | ORAL | Status: DC
  Administered 2014-01-05: 5 mg via ORAL
  Filled 2014-01-05 (×2): qty 1

## 2014-01-05 MED ORDER — MAGNESIUM HYDROXIDE 400 MG/5ML PO SUSP
30.0000 mL | Freq: Every day | ORAL | Status: DC | PRN
  Administered 2014-01-10 – 2014-01-11 (×2): 30 mL via ORAL

## 2014-01-05 MED ORDER — TEMAZEPAM 15 MG PO CAPS: 15.0000 mg | ORAL_CAPSULE | Freq: Every evening | ORAL | Status: DC | PRN

## 2014-01-05 MED ORDER — OLANZAPINE 7.5 MG PO TABS
15.0000 mg | ORAL_TABLET | Freq: Every day | ORAL | Status: DC
  Filled 2014-01-05: qty 2
  Filled 2014-01-05: qty 6

## 2014-01-05 MED ORDER — LORAZEPAM 1 MG PO TABS: 1.0000 mg | ORAL_TABLET | Freq: Three times a day (TID) | ORAL | Status: DC | PRN

## 2014-01-05 MED ORDER — PAROXETINE HCL 20 MG PO TABS
20.0000 mg | ORAL_TABLET | Freq: Every day | ORAL | Status: DC
  Administered 2014-01-05: 20 mg via ORAL
  Filled 2014-01-05: qty 2
  Filled 2014-01-05 (×2): qty 1

## 2014-01-05 MED ORDER — CLONAZEPAM 0.5 MG PO TABS
0.5000 mg | ORAL_TABLET | Freq: Three times a day (TID) | ORAL | Status: DC
  Administered 2014-01-06 – 2014-01-09 (×11): 0.5 mg via ORAL
  Filled 2014-01-05 (×11): qty 1

## 2014-01-05 MED ORDER — CLONAZEPAM 0.5 MG PO TABS
0.5000 mg | ORAL_TABLET | Freq: Three times a day (TID) | ORAL | Status: DC
  Administered 2014-01-05: 0.5 mg via ORAL
  Filled 2014-01-05: qty 1

## 2014-01-05 MED ORDER — ZOLPIDEM TARTRATE 5 MG PO TABS: 5.0000 mg | ORAL_TABLET | Freq: Every evening | ORAL | Status: DC | PRN

## 2014-01-05 NOTE — Progress Notes (Signed)
Per, Dr. Lovena Le patient meets criteria for inpatient hospitalization at [facility].

## 2014-01-05 NOTE — ED Notes (Signed)
Pt has been accepted, but bed is not available yet. [place] will call when bed is available.

## 2014-01-05 NOTE — Progress Notes (Signed)
CSW spoke with Lyda Jester to confirm available. Randall Hiss reports the patient will be going to bed 500-02 and it will available in the next 20 minutes.  CSW reviewed the voluntary paperwork with the patient and answered any questions.  Patient acknowledged his understand of the process and willing signed all paperwork.  CSW faxed to [facility] and provided all documentation to the nursing staff to complete the transfer.      Chesley Noon, MSW, [place], 01/05/2014 Evening Clinical Social Worker [phone]

## 2014-01-05 NOTE — Progress Notes (Signed)
D: Pt mood is depressed. Eye contact fair. Pt just arrived a couple of hours ago but was able to go to [place] which he enjoyed. Pt denies SI/HI/AVH.  A: Support given. Verbalization encouraged. Pt encouraged to come to nurse with any concerns. Medications given as prescribed.  R: Pt is receptive. No complaints of pain or discomfort at this time. Q15 min safety checks maintained. Will continue to monitor pt.

## 2014-01-05 NOTE — ED Notes (Signed)
Pt alert, arrives via Police, presents from home, c/o SI, pt states "unable to pay IRS", wants to step into traffic, resp even unlabored, skin pwd

## 2014-01-05 NOTE — ED Notes (Signed)
Up to the desk on the phone 

## 2014-01-05 NOTE — ED Provider Notes (Addendum)
CSN: [id number]     Arrival date & time 01/05/14  1111 History   First MD Initiated Contact with Patient 01/05/14 1158     Chief Complaint  Patient presents with  . Medical Clearance     (Consider location/radiation/quality/duration/timing/severity/associated sxs/prior Treatment) HPI Pt presenting due to feelings of SI.  He states that he filled out his tax returns today and states that he cannot pay the IRS what he owes.  He states he is afraid to go to prison and feels suicidal.  He states he was [place] the [place] of [address] waiting for a big truck and he was planning to step in front of the truck.  However he states his wife came and he got in the car with her.  Pt denies substance use.  States he has a long history of depression.  No recent illness. No fever/cough/vomiting.  There are no other associated systemic symptoms, there are no other alleviating or modifying factors.   Past Medical History  Diagnosis Date  . ALLERGIC RHINITIS 10/05/2007  . ANXIETY 10/05/2007  . COLONIC POLYPS, HX OF 10/05/2007  . DEPRESSION 10/04/2008  . PROSTATE CANCER, UNSPEC. 10/05/2007  . 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 Tonsillectomy    . Prostate surgery    . Nasal sinus surgery     Family History  Problem Relation Age of Onset  . Prostate cancer Father   . Breast cancer Sister    History  Substance Use Topics  . Smoking status: Former Smoker    Quit date: 10/13/1973  . Smokeless tobacco: Never Used  . Alcohol Use: 7.0 oz/week    14 drink(s) per week    Review of Systems ROS reviewed and all otherwise negative except for mentioned in HPI    Allergies  Review of patient's allergies indicates no known allergies.  Home Medications   Current Outpatient Rx  Name  Route  Sig  Dispense  Refill  . busPIRone (BUSPAR) 10 MG tablet   Oral   Take 10 mg by mouth 3 (three) times daily.          . clonazePAM (KLONOPIN) 0.5 MG  tablet   Oral   Take 0.5 mg by mouth 3 (three) times daily.          Marland Kitchen OLANZapine (ZYPREXA) 5 MG tablet   Oral   Take 5 mg by mouth at bedtime.         Marland Kitchen PARoxetine (PAXIL) 20 MG tablet   Oral   Take 20 mg by mouth at bedtime.         . temazepam (RESTORIL) 15 MG capsule   Oral   Take 15 mg by mouth at bedtime as needed for sleep.          BP 123/76  Pulse 77  Temp(Src) 97.6 F (36.4 C) (Oral)  Resp 16  Wt 200 lb (90.719 kg)  SpO2 97% Vitals reviewed Physical Exam Physical Examination: General appearance - alert, well appearing, and in no distress Mental status - alert, oriented to person, place, and time Eyes - no scleral icterus, no conjunctival injection Mouth - mucous membranes moist, pharynx normal without lesions Chest - clear to auscultation, no wheezes, rales or rhonchi, symmetric air entry Heart - normal rate, regular rhythm, normal S1, S2, no murmurs, rubs, clicks or gallops Abdomen - soft, nontender, nondistended, no masses or organomegaly Extremities - peripheral pulses normal, no pedal edema, no clubbing or  cyanosis Skin - normal coloration and turgor, no rashes, no suspicious skin lesions noted Psych- flat affect, calm and cooperative  ED Course  Procedures (including critical care time)  3:32 PM pt has been accepted to [facility].   Labs Review Labs Reviewed  CBC - Abnormal; Notable for the following:    WBC 3.2 (*)    Platelets 124 (*)    All other components within normal limits  COMPREHENSIVE METABOLIC PANEL - Abnormal; Notable for the following:    Glucose, Bld 201 (*)    GFR calc non Af Amer 65 (*)    GFR calc Af Amer 75 (*)    All other components within normal limits  URINE RAPID DRUG SCREEN (HOSP PERFORMED) - Abnormal; Notable for the following:    Benzodiazepines POSITIVE (*)    All other components within normal limits  ACETAMINOPHEN LEVEL  ETHANOL   Imaging Review No results found.   EKG Interpretation None      MDM    Final diagnoses:  Depression  Suicidal ideation    Pt presenting with suicidal ideation with a plan to walk into traffic.  He is medically clear.  Pt to be evaluated by TTS, psych holding orders written    Threasa Beards, MD 01/05/14 [place], MD 01/05/14 [phone]

## 2014-01-05 NOTE — Progress Notes (Signed)
80 year old male pt admitted on voluntary basis. Pt reports that he did indeed attempt to walk out in front of a truck but was stopped by his wife and another person. Pt stated that he did not wish to discuss this matter at time of admission and only wanted to speak to the doctor. 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 Pt did speak about how he was in [place] [facility] about a year ago for the same thing. When asked about pt feeling suicidal pt responded "not really", and able to contract for safety on the unit. Pt does endorse taking his medications as prescribed and states he drinks a cup of chardonnay each night before bed. Pt does endorse having a healthcare power of attorney and stated that it is at his house. Pt denied any medical issues other than having a past issue of prostate cancer and wears briefs as he states he has some problems with leaking and also reports that he uses the bathroom a couple of times each night because of this. Pt was oriented to the unit and safety maintained.

## 2014-01-05 NOTE — Consult Note (Signed)
[facility] Face-to-Face Psychiatry Consult   Reason for Consult:  Depression with psychosis Referring Physician:  EDP  Gary Osborn is an 80 y.o. male. Total Time spent with patient: 45 minutes  Assessment: AXIS I:  Major Depression, Recurrent severe, with psychosis AXIS II:  Deferred AXIS III:   Past Medical History  Diagnosis Date  . ALLERGIC RHINITIS 10/05/2007  . ANXIETY 10/05/2007  . COLONIC POLYPS, HX OF 10/05/2007  . DEPRESSION 10/04/2008  . PROSTATE CANCER, UNSPEC. 10/05/2007  . 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  Subjective:   Gary Osborn is a 80 y.o. male patient admitted with Major Depression, Recurrent severe, with psychosis.  HPI:  Patient states "I attempted suicide.  I didn't; I was going to run out into traffic on [address], but I wasn't able to do it.  I was at [facility] yesterday but I convinced them that I was okay.  My wife and I are broke.  I had just finished the tax forms to send in and I feel like every check is going to bounce and I'll end up going to prison.  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  My wife is delusional that we are not broke."  Patient states that he does have issues with OCD. "I have negative thoughts in my head that unable to stop.  I am having some concerns with my medications and wanting to go over them." Patient also states that he did write a book on Nazi's "and it was distributed to some of the people in [place].  When I become famous from the writing in the news paper related to going to jail.   Will be well known."  Patient denies homicidal ideation but endorsing suicidal ideation, delusional thing.    Patient is ambulatory and able to care for himself.  Patient states that he does not need any assistance.    HPI Elements:   Location:  Suicidal ideation. Quality:  thoughts to walk into traffic. Severity:  thoughts to walk into traffic.. Timing:  Worsening over the couple months.  Patient states that his daughter committed suicide but was having problem for a while.  Past Psychiatric History: Past Medical History  Diagnosis Date  . ALLERGIC RHINITIS 10/05/2007  . ANXIETY 10/05/2007  . COLONIC POLYPS, HX OF 10/05/2007  . DEPRESSION 10/04/2008  . PROSTATE CANCER, UNSPEC. 10/05/2007  . 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 He has never used smokeless tobacco. He reports that he drinks about 7.0 ounces of alcohol per week. He reports that he does not use illicit drugs. Family History  Problem Relation Age of Onset  . Prostate cancer Father   . Breast cancer Sister            Allergies:  No Known Allergies  ACT Assessment Complete:  No:   Past Psychiatric History: Diagnosis:  Major Depression, Recurrent severe, with psychosis.   Hospitalizations:  Yes  Outpatient Care:  Yes  Substance Abuse Care:  Denies  Self-Mutilation:  Denies  Suicidal Attempts:  Yes  Homicidal Behaviors:  Denies   Violent Behaviors:  Denies   Place of Residence:  [place] Marital Status:  Married Employed/Unemployed:  Retired Education:   Family Supports:  Yes Objective: Blood pressure 136/76, pulse 80, temperature 98 F (36.7 C), temperature source Oral, resp. rate 16, weight 90.719 kg (200 lb), SpO2 99.00%.Body mass index is 28.7 kg/(m^2). Results for orders placed during the hospital encounter of 01/05/14 (from the past 72 hour(s))  ACETAMINOPHEN LEVEL     Status: None   Collection Time    01/05/14 11:29 AM      Result Value Ref Range    Acetaminophen (Tylenol), Serum <15.0  10 - 30 ug/mL   Comment:            THERAPEUTIC CONCENTRATIONS VARY     SIGNIFICANTLY. A RANGE OF 10-30     ug/mL MAY BE AN EFFECTIVE     CONCENTRATION FOR MANY PATIENTS.     HOWEVER, SOME ARE BEST TREATED     AT CONCENTRATIONS OUTSIDE THIS     RANGE.     ACETAMINOPHEN CONCENTRATIONS     >150 ug/mL AT 4 HOURS AFTER     INGESTION AND >50 ug/mL AT 12     HOURS AFTER INGESTION ARE     OFTEN ASSOCIATED WITH TOXIC     REACTIONS.  CBC     Status: Abnormal   Collection Time    01/05/14 11:29 AM      Result Value Ref Range   WBC 3.2 (*) 4.0 - 10.5 K/uL   RBC 4.31  4.22 - 5.81 MIL/uL   Hemoglobin 14.3  13.0 - 17.0 g/dL   HCT 41.7  39.0 - 52.0 %   MCV 96.8  78.0 - 100.0 fL   MCH 33.2  26.0 - 34.0 pg   MCHC 34.3  30.0 - 36.0 g/dL   RDW 12.7  11.5 - 15.5 %   Platelets 124 (*) 150 - 400 K/uL  COMPREHENSIVE METABOLIC PANEL     Status: Abnormal   Collection Time    01/05/14 11:29 AM      Result Value Ref Range   Sodium 140  137 - 147 mEq/L   Potassium 4.3  3.7 - 5.3 mEq/L   Chloride 104  96 - 112 mEq/L   CO2 24  19 - 32 mEq/L   Glucose, Bld 201 (*) 70 - 99 mg/dL   BUN 17  6 - 23 mg/dL   Creatinine, Ser 1.06  0.50 - 1.35 mg/dL   Calcium 9.3  8.4 - 10.5 mg/dL   Total Protein 6.5  6.0 - 8.3 g/dL   Albumin 3.8  3.5 - 5.2 g/dL   AST 16  0 - 37 U/L   ALT 21  0 - 53 U/L   Alkaline Phosphatase 85  39 - 117 U/L   Total Bilirubin 0.7  0.3 - 1.2 mg/dL   GFR calc non Af Amer 65 (*) >90 mL/min   GFR calc Af Amer 75 (*) >90 mL/min   Comment: (NOTE)     The eGFR has been calculated using the CKD EPI equation.     This calculation has not been validated in all clinical situations.     eGFR's persistently <90 mL/min signify possible Chronic Kidney     Disease.  ETHANOL     Status: None   Collection Time    01/05/14 11:29 AM      Result Value Ref Range  Alcohol, Ethyl (B) <11  0 - 11 mg/dL   Comment:            LOWEST DETECTABLE LIMIT FOR     SERUM  ALCOHOL IS 11 mg/dL     FOR MEDICAL PURPOSES ONLY  URINE RAPID DRUG SCREEN (HOSP PERFORMED)     Status: Abnormal   Collection Time    01/05/14 12:08 PM      Result Value Ref Range   Opiates NONE DETECTED  NONE DETECTED   Cocaine NONE DETECTED  NONE DETECTED   Benzodiazepines POSITIVE (*) NONE DETECTED   Amphetamines NONE DETECTED  NONE DETECTED   Tetrahydrocannabinol NONE DETECTED  NONE DETECTED   Barbiturates NONE DETECTED  NONE DETECTED   Comment:            DRUG SCREEN FOR MEDICAL PURPOSES     ONLY.  IF CONFIRMATION IS NEEDED     FOR ANY PURPOSE, NOTIFY LAB     WITHIN 5 DAYS.                LOWEST DETECTABLE LIMITS     FOR URINE DRUG SCREEN     Drug Class       Cutoff (ng/mL)     Amphetamine      1000     Barbiturate      200     Benzodiazepine   200     Tricyclics       300     Opiates          300     Cocaine          300     THC              50   Labs are reviewed and are pertinent for UDS positive for Benzo which he is prescribed.  CBC abnormal values CBC    Component Value Date/Time   WBC 3.2* 01/05/2014 1129   RBC 4.31 01/05/2014 1129   HGB 14.3 01/05/2014 1129   HCT 41.7 01/05/2014 1129   PLT 124* 01/05/2014 1129   MCV 96.8 01/05/2014 1129   MCH 33.2 01/05/2014 1129   MCHC 34.3 01/05/2014 1129   RDW 12.7 01/05/2014 1129   LYMPHSABS 1.0 09/12/2013 1523   MONOABS 0.4 09/12/2013 1523   EOSABS 0.1 09/12/2013 1523   BASOSABS 0.0 09/12/2013 1523    Home medications review and no changes made.  Current Facility-Administered Medications  Medication Dose Route Frequency Provider Last Rate Last Dose  . acetaminophen (TYLENOL) tablet 650 mg  650 mg Oral Q4H PRN Ethelda Chick, MD      . alum & mag hydroxide-simeth (MAALOX/MYLANTA) 200-200-20 MG/5ML suspension 30 mL  30 mL Oral PRN Ethelda Chick, MD      . LORazepam (ATIVAN) tablet 1 mg  1 mg Oral Q8H PRN Ethelda Chick, MD      . ondansetron [facility]) tablet 4 mg  4 mg Oral Q8H PRN Ethelda Chick, MD      . zolpidem  (AMBIEN) tablet 5 mg  5 mg Oral QHS PRN Ethelda Chick, MD       Current Outpatient Prescriptions  Medication Sig Dispense Refill  . busPIRone (BUSPAR) 10 MG tablet Take 10 mg by mouth 3 (three) times daily.       . clonazePAM (KLONOPIN) 0.5 MG tablet Take 0.5 mg by mouth 3 (three) times daily.       Marland Kitchen OLANZapine (ZYPREXA) 5 MG tablet Take  5 mg by mouth at bedtime.      Marland Kitchen PARoxetine (PAXIL) 20 MG tablet Take 20 mg by mouth at bedtime.      . temazepam (RESTORIL) 15 MG capsule Take 15 mg by mouth at bedtime as needed for sleep.        Psychiatric Specialty Exam:     Blood pressure 136/76, pulse 80, temperature 98 F (36.7 C), temperature source Oral, resp. rate 16, weight 90.719 kg (200 lb), SpO2 99.00%.Body mass index is 28.7 kg/(m^2).  General Appearance: Casual  Eye Contact::  Good  Speech:  Clear and Coherent and Normal Rate  Volume:  Normal  Mood:  Depressed  Affect:  Congruent  Thought Process:  Circumstantial and Goal Directed  Orientation:  Full (Time, Place, and Person)  Thought Content:  Delusions  Suicidal Thoughts:  Yes.  with intent/plan  Homicidal Thoughts:  No  Memory:  Immediate;   Good Recent;   Good  Judgement:  Impaired  Insight:  Fair  Psychomotor Activity:  Normal  Concentration:  Good  Recall:  Good  Fund of Knowledge:Good  Language: Good  Akathisia:  No  Handed:  Right  AIMS (if indicated):     Assets:  Communication Skills Desire for [place] Support  Sleep:      Musculoskeletal: Strength & Muscle Tone: within normal limits Gait & Station: normal Patient leans: N/A  Treatment Plan Summary: Daily contact with patient to assess and evaluate symptoms and progress in treatment Medication management  Disposition:  Inpatient treatment recommended. Patient has been accepted to [facility].  Debarah Crape RN will call with bed today.  Will monitor for safety and stabilization until patient has been transferred to [facility]  Rankin, Shuvon, FNP-BC 01/05/2014 1:35 PM

## 2014-01-05 NOTE — ED Notes (Signed)
Dr Lovena Le and shuvon np into see

## 2014-01-05 NOTE — ED Notes (Signed)
Pt has been up to the bathroom several times since arriving, pt denies dirrhea.

## 2014-01-05 NOTE — ED Notes (Signed)
Dr Lovena Le and Gary Osborn in w/pt

## 2014-01-05 NOTE — ED Provider Notes (Signed)
6:27 PM Accepted to [facility] by Dr. Kathie Dike. I did not directly participate in the care of this pt.   Clinical Impression 1. MDD (major depressive disorder), recurrent episode, severe   2. Depression   3. MDD (major depressive disorder), recurrent, severe, with psychosis      Blanchard Kelch, MD 01/05/14 1827

## 2014-01-05 NOTE — Tx Team (Signed)
Initial Interdisciplinary Treatment Plan  PATIENT STRENGTHS: (choose at least two) Ability for insight Average or above average intelligence Capable of independent living General fund of knowledge  PATIENT STRESSORS: Financial difficulties   PROBLEM LIST: Problem List/Patient Goals Date to be addressed Date deferred Reason deferred Estimated date of resolution  Depression 01/05/14     Suicidal Ideation 01/05/14                                                DISCHARGE CRITERIA:  Ability to meet basic life and health needs Improved stabilization in mood, thinking, and/or behavior Verbal commitment to aftercare and medication compliance  PRELIMINARY DISCHARGE PLAN: Attend aftercare/continuing care group Return to previous living arrangement  PATIENT/FAMIILY INVOLVEMENT: This treatment plan has been presented to and reviewed with the patient, Gary Osborn, and/or family member, .  The patient and family have been given the opportunity to ask questions and make suggestions.  Parole, [place] 01/05/2014, 8:24 PM

## 2014-01-05 NOTE — ED Notes (Signed)
Pt transported to [facility] by [facility] w/ [facility], belongings sent w/ transport service.

## 2014-01-06 DIAGNOSIS — R45851 Suicidal ideations: Secondary | ICD-10-CM

## 2014-01-06 MED ORDER — PAROXETINE HCL 30 MG PO TABS
30.0000 mg | ORAL_TABLET | Freq: Every day | ORAL | Status: DC
  Administered 2014-01-06 – 2014-01-08 (×3): 30 mg via ORAL
  Filled 2014-01-06 (×4): qty 1
  Filled 2014-01-06: qty 3

## 2014-01-06 MED ORDER — OLANZAPINE 10 MG PO TABS
10.0000 mg | ORAL_TABLET | Freq: Every day | ORAL | Status: DC
  Administered 2014-01-06 – 2014-01-10 (×5): 10 mg via ORAL
  Filled 2014-01-06 (×7): qty 1

## 2014-01-06 NOTE — BHH Group Notes (Signed)
[facility] LCSW Group Therapy  01/06/2014 3:35 PM  Type of Therapy:  Group Therapy  Participation Level:  Active  Participation Quality:  Attentive and restless (came in and out of group  Affect:  Anxious  Cognitive:  Alert  Insight:  Developing/Improving  Engagement in Therapy:  Limited  Modes of Intervention:  Discussion, Exploration and Problem-solving  Summary of Progress/Problems:  Group today discussed recovery in means of substance abuse and mental health.  Members discussed what it feels like to be healthy and stable, supports that increase recovery and different ways of remaining in recovery.  Gary Osborn) was invested in group today, more so than his report in group this am.  He discussed his frustration with therapist and psych mds not communicating and looking to always change medications. He reports this hinders recovery because it makes you lose trust and worry that something is not going to happen or something will happen negatively.  He came and went throughout group, was very restless.     Gary Osborn 01/06/2014, 3:35 PM

## 2014-01-06 NOTE — Progress Notes (Signed)
Writer has observed patient up in the dayroom watching tv with minimal interaction with peers. Patient reports that his doctor is making some changes to his medications and writer discussed those changes with him. He reports feeling faint after taking his 5 pm meds and he reports telling staff. Writer re-educated him on fall prevention and encouraged him to take in more fluids in case he is dehydrated and he reports that he does not want to be drinking a lot of fluids at bedtime d/t getting up during the night. Writer encouraged fluids during the day and he was receptive. He currently denies si/hi/a/v hallucinations. Safety maintained on unit with 15 min checks.

## 2014-01-06 NOTE — Progress Notes (Signed)
Patient ID: Gary Osborn, male   DOB: 1933/09/03, 80 y.o.   MRN: [id number] PER STATE REGULATIONS 482.30  THIS CHART WAS REVIEWED FOR MEDICAL NECESSITY WITH RESPECT TO THE PATIENT'S ADMISSION/ DURATION OF STAY.  NEXT REVIEW DATE: 01/08/2014  Chauncy Lean, RN, BSN CASE MANAGER

## 2014-01-06 NOTE — BHH Suicide Risk Assessment (Signed)
Suicide Risk Assessment  Admission Assessment     Nursing information obtained from:    Demographic factors:    Current Mental Status:    Loss Factors:    Historical Factors:    Risk Reduction Factors:    Total Time spent with patient: 45 minutes  CLINICAL FACTORS:   Severe Anxiety and/or Agitation Depression:   Anhedonia Hopelessness Impulsivity Insomnia Recent sense of peace/wellbeing Severe Obsessive-Compulsive Disorder Currently Psychotic Previous Psychiatric Diagnoses and Treatments Medical Diagnoses and Treatments/Surgeries   COGNITIVE FEATURES THAT CONTRIBUTE TO RISK:  Closed-mindedness Loss of executive function Polarized thinking Thought constriction (tunnel vision)    SUICIDE RISK:   Moderate:  Frequent suicidal ideation with limited intensity, and duration, some specificity in terms of plans, no associated intent, good self-control, limited dysphoria/symptomatology, some risk factors present, and identifiable protective factors, including available and accessible social support.  PLAN OF CARE: Admit for crisis stabilization, safety monitoring and medication management for depression, anxiety, and suicidal ideations.  I certify that inpatient services furnished can reasonably be expected to improve the patient's condition.  Boy Delamater,JANARDHAHA R. 01/06/2014, 11:41 AM

## 2014-01-06 NOTE — Progress Notes (Signed)
D: Patient denies SI/HI and A/V hallucinations; patient reports that he had a well night sleep; patient reports that he is having some increased anxiety and that he has some challenges and does not wish to elaborate but reports that he may tell the doctor  A: Monitored q 15 minutes; patient encouraged to attend groups; patient educated about medications; patient given medications per physician orders; patient encouraged to express feelings and/or concerns  R: Patient is appropriate and animated at times; patient reports that he anxiety is doing well after the medication; patient is cooperative and has had no complaints today; patient's interaction with staff and peers is appropriate; patient was able to set goal to talk with staff 1:1 when having feelings of SI; patient is taking medications as prescribed and tolerating medications; patient is attending some groups

## 2014-01-06 NOTE — BHH Suicide Risk Assessment (Cosign Needed)
[facility] INPATIENT:  Family/Significant Other Suicide Prevention Education  Suicide Prevention Education:  Education Completed; Wife, Gary Osborn, [phone] has been identified by the patient as the family member/significant other with whom the patient will be residing, and identified as the person(s) who will aid the patient in the event of a mental health crisis (suicidal ideations/suicide attempt).  With written consent from the patient, the family member/significant other has been provided the following suicide prevention education, prior to the and/or following the discharge of the patient.  The suicide prevention education provided includes the following:  Suicide risk factors  Suicide prevention and interventions  National Suicide Hotline telephone number  [facility] assessment telephone number  [facility] Emergency Assistance [place] and/or Residential Mobile Crisis Unit telephone number  Request made of family/significant other to:  Remove weapons (e.g., guns, rifles, knives), all items previously/currently identified as safety concern.    Remove drugs/medications (over-the-counter, prescriptions, illicit drugs), all items previously/currently identified as a safety concern.  The family member/significant other verbalizes understanding of the suicide prevention education information provided.  The family member/significant other agrees to remove the items of safety concern listed above.  Gary Osborn 01/06/2014, 4:00 PM

## 2014-01-06 NOTE — Progress Notes (Signed)
SW intern had a change to interview patient for psychosocial. During psychosocial patient was very fixated on his penis reporting his penis was small and a prostate surgery.  Patient is worried about going to jail with a small penis due to writing bad check and fraud.  Patient at times would be unfocused and become off task reverting back to his penis and being sexual towards intern AEB attempting to shut door in room and also staying off task with the assessment when redirected several times.    Caleen Essex, MSW, [place] Clinical Lead [phone]

## 2014-01-06 NOTE — BHH Group Notes (Signed)
[facility] LCSW Aftercare Discharge Planning Group Note   01/06/2014 9:27 AM  Participation Quality:  Minimal (reports he will only be speaking with the doctors)  Mood/Affect:  Defensive and Irritable  Depression Rating:  8  Anxiety Rating:  9  Thoughts of Suicide:  No Will you contract for safety?   Yes  Current AVH:  No  Plan for Discharge/Comments:  Gary Osborn would not make any comments related to his discharge. He reports he will not speak to anyone other than the doctor about his needs.  He shares he is not sleeping and has poor appetite.    Transportation Means: unknown   Supports: unknown  [name]
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
## 2014-01-06 NOTE — BHH Counselor (Signed)
Adult Comprehensive Assessment  Patient ID: Gary Osborn, male   DOB: 1933/05/24, 80 y.o.   MRN: [id number]  Information Source:    Patient  Current Stressors:  Educational / Learning stressors: Yes, pt indicates he has difficultes with technology.   Employment / Job issues: N/A Family Relationships: Yes, has no contact with son who in [place].   Financial / Lack of resources (include bankruptcy): Yes, excessive thoughts about his finances related to his taxes, bank accounts, and social security checks.   Housing / Lack of housing: N/A Physical health (include injuries & life threatening diseases): Yes  Social relationships: N/A Substance abuse: N/A Bereavement / Loss: Yes, loss his daughter 3 years ago by suicide.    Living/Environment/Situation:  Living Arrangements: Spouse/significant other Living conditions (as described by patient or guardian): Stressful due to finance; however wife has been encouraging towards pt.   How long has patient lived in current situation?: 30 years  What is atmosphere in current home: Supportive  Family History:  Marital status: Married Number of Years Married: 32 What types of issues is patient dealing with in the relationship?: Finances due to pt's bad choices, and trust issues.   "Our lives are crumbling."   Does patient have children?: Yes How many children?: 2 How is patient's relationship with their children?: 1 daughter and 1 son - Pt indicates that daughter died three years ago by suicide.  Son, who lives in [place], does not talk to pt, only to mother.    Childhood History:  By whom was/is the patient raised?: Both parents Description of patient's relationship with caregiver when they were a child: "It was good."   Patient's description of current relationship with people who raised him/her: Both parents are deceased  Does patient have siblings?: Yes Number of Siblings: 1 Description of patient's current relationship with siblings: 1  sister - "Lives in [place].  Our relationship is good."   Did patient suffer any verbal/emotional/physical/sexual abuse as a child?: No Did patient suffer from severe childhood neglect?: No Has patient ever been sexually abused/assaulted/raped as an adolescent or adult?: No Was the patient ever a victim of a crime or a disaster?: No Witnessed domestic violence?: No Has patient been effected by domestic violence as an adult?: No  Education:  Highest grade of school patient has completed: Dietitian in Business  Currently a student?: No Learning disability?: No  Employment/Work Situation:   Employment situation: Unemployed (Retired - 18 years ) What is the longest time patient has a held a job?: 30 years  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 Has patient ever been in the [employer]?: No Has patient ever served in Recruitment consultant?: No  Financial Resources:   Museum/gallery curator resources: [employer] Does patient have a Programmer, applications or guardian?: No  Alcohol/Substance Abuse:   What has been your use of drugs/alcohol within the last 12 months?: Denies  Alcohol/Substance Abuse Treatment Hx: Denies past history Has alcohol/substance abuse ever caused legal problems?: No  Social Support System:   Heritage manager System: Gravity: Wife and sister  Type of faith/religion: None How does patient's faith help to cope with current illness?: None   Leisure/Recreation:   Leisure and Hobbies: Music and writing books   Strengths/Needs:   What things does the patient do well?: "Not sure if I have any strenghts now."   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  Sheran Spine - [facility], Therapist - [facility] ) Does patient have financial barriers related to discharge medications?: No  Summary/Recommendations:   Summary and Recommendations (to be completed by the evaluator): [name] is a 80 YO retired [place] male who presents with depressed mood, insominia, and diminished ability to concentrate.  He indicates feeling anxious most days due to excessive thoughts about his finances.  Pt indicates his only source of income is the social security checks and it has been difficult for him to manage his money.  Sees Dr. Sheran Spine at [place] for medication managment and Irineo Axon for therapy.  Does receives SSI.  He can benefit from crisis stablization, therapeutic milieu, medication management, and referral for services.    [name] 01/06/2014

## 2014-01-06 NOTE — Progress Notes (Signed)
[facility] Notes:  (Nursing/MHT/Case Management/Adjunct)  Date:  01/06/2014  Time:  9:08 PM  Type of Therapy:  Group Therapy  Participation Level:  Minimal  Participation Quality:  Appropriate  Affect:  Not Congruent  Cognitive:  Confused  Insight:  Improving  Engagement in Group:  Developing/Improving  Modes of Intervention:  Socialization and Support  Summary of Progress/Problems: Pt. Was unable to rate his energy level at the time. Pt. Was encouraged to talk with staff and develop coping skills to prevent relapse.  Lanell Persons 01/06/2014, 9:08 PM

## 2014-01-07 DIAGNOSIS — F333 Major depressive disorder, recurrent, severe with psychotic symptoms: Principal | ICD-10-CM

## 2014-01-07 DIAGNOSIS — F429 Obsessive-compulsive disorder, unspecified: Secondary | ICD-10-CM

## 2014-01-07 NOTE — Progress Notes (Signed)
D.  Pt pleasant but anxious on approach, denies complaints at this time.  Denies SI/HI/hallucinations at this time.  Attempted to get EKG but unable that was ordered due to machine not working and Pt increasing anxiety after a 30 minute attempt.  Doctor on call notified.  Pt requested and received bedtime medication after EKG attempt.  A.  Support and encouragement offered  R.  Pt remains safe on unit, will continue to monitor.

## 2014-01-07 NOTE — Progress Notes (Signed)
[facility] MD Progress Note  01/07/2014 9:32 AM Gary Osborn  MRN:  [id number] Subjective:  Met with Gary Osborn 1 to 1 today to discuss his care and response to treatment. He says he is very worried that the [facility] department will pick him up on Monday for writing several bad checks. He states he can not get in contact with his banker to confirm that these checks to the IRS and his mortgage will bounce. 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 I have a great deal of trouble urinating in front of other men."  Patient also goes on to state that he is sedated from the medication and that has helped. Diagnosis:   DSM5: Schizophrenia Disorders:  Obsessive-Compulsive Disorders:  Trauma-Stressor Disorders:  Substance/Addictive Disorders:  Depressive Disorders:  AXIS I: Major Depression, Recurrent severe, Obsessive Compulsive Disorder and Psychotic Disorder NOS  AXIS II: Deferred  AXIS III:  Past Medical History   Diagnosis  Date   .  ALLERGIC RHINITIS  10/05/2007   .  ANXIETY  10/05/2007   .  COLONIC POLYPS, HX OF  10/05/2007   .  DEPRESSION  10/04/2008   .  PROSTATE CANCER, UNSPEC.  10/05/2007   .  SINUS BRADYCARDIA  10/04/2008    AXIS IV: other psychosocial or environmental problems, problems related to social environment and problems with primary support group  AXIS V: 41-50 serious symptoms  ADL's:  Intact  Sleep: Good  Appetite:  Fair  Suicidal Ideation:  denies Homicidal Ideation:  denies AEB (as evidenced by):  Psychiatric Specialty Exam: Physical Exam  ROS  Blood pressure 130/84, pulse 76, temperature 97.9 F (36.6 C), temperature source Oral, resp. rate 18, height $RemoveBe'5\' 7"'larxvhsXl$  (1.702 m), weight 87.091 kg (192 lb), SpO2 96.00%.Body mass index is 30.06 kg/(m^2).  General Appearance: Casual  Eye Contact::  Fair  Speech:  Clear and Coherent rambling, circumstantial   Volume:  Normal  Mood:  Anxious  Affect:  Congruent  Thought Process:  Disorganized  Orientation:  Full (Time, Place, and Person)  Thought Content:  Delusions  Suicidal Thoughts:  No  Homicidal Thoughts:  No  Memory:  NA  Judgement:  Impaired  Insight:  Lacking  Psychomotor Activity:  Normal  Concentration:  Poor  Recall:  Poor  Fund of Knowledge:Poor  Language: Good  Akathisia:  No  Handed:  Right  AIMS (if indicated):     Assets:  Communication Skills Desire for Improvement  Sleep:  Number of Hours: 6.75   Musculoskeletal: Strength & Muscle Tone: within normal limits Gait & Station: normal Patient leans: N/A  Current Medications: Current Facility-Administered Medications  Medication Dose Route Frequency Provider Last Rate Last Dose  . busPIRone (BUSPAR) tablet 20 mg  20 mg Oral TID Shuvon Rankin, NP   20 mg at 01/07/14 0853  . clonazePAM (KLONOPIN) tablet 0.5 mg  0.5 mg Oral TID Shuvon Rankin, NP   0.5 mg at 01/07/14 0854  . magnesium hydroxide (MILK OF MAGNESIA) suspension 30 mL  30 mL Oral Daily PRN Shuvon Rankin, NP      . OLANZapine (ZYPREXA) tablet 10 mg  10 mg Oral QHS Durward Parcel, MD   10 mg at 01/06/14 2103  . ondansetron (ZOFRAN) tablet 4 mg  4 mg Oral Q8H PRN Shuvon Rankin, NP      . PARoxetine (PAXIL) tablet 30 mg  30 mg Oral QHS Durward Parcel, MD   30 mg at 01/06/14 2103  . temazepam (RESTORIL) capsule 15 mg  15 mg Oral QHS PRN Shuvon Rankin, NP   15 mg at 01/06/14 2103    Lab Results:  Results for orders placed during the hospital encounter of 01/05/14 (from the past 48 hour(s))  ACETAMINOPHEN LEVEL     Status: None   Collection Time    01/05/14 11:29 AM      Result Value Ref Range   Acetaminophen (Tylenol), Serum <15.0  10 - 30 ug/mL   Comment:            THERAPEUTIC CONCENTRATIONS VARY     SIGNIFICANTLY. A RANGE OF 10-30     ug/mL MAY BE AN EFFECTIVE     CONCENTRATION FOR MANY PATIENTS.     HOWEVER, SOME ARE BEST TREATED      AT CONCENTRATIONS OUTSIDE THIS     RANGE.     ACETAMINOPHEN CONCENTRATIONS     >150 ug/mL AT 4 HOURS AFTER     INGESTION AND >50 ug/mL AT 12     HOURS AFTER INGESTION ARE     OFTEN ASSOCIATED WITH TOXIC     REACTIONS.  CBC     Status: Abnormal   Collection Time    01/05/14 11:29 AM      Result Value Ref Range   WBC 3.2 (*) 4.0 - 10.5 K/uL   RBC 4.31  4.22 - 5.81 MIL/uL   Hemoglobin 14.3  13.0 - 17.0 g/dL   HCT 41.7  39.0 - 52.0 %   MCV 96.8  78.0 - 100.0 fL   MCH 33.2  26.0 - 34.0 pg   MCHC 34.3  30.0 - 36.0 g/dL   RDW 12.7  11.5 - 15.5 %   Platelets 124 (*) 150 - 400 K/uL  COMPREHENSIVE METABOLIC PANEL     Status: Abnormal   Collection Time    01/05/14 11:29 AM      Result Value Ref Range   Sodium 140  137 - 147 mEq/L   Potassium 4.3  3.7 - 5.3 mEq/L   Chloride 104  96 - 112 mEq/L   CO2 24  19 - 32 mEq/L   Glucose, Bld 201 (*) 70 - 99 mg/dL   BUN 17  6 - 23 mg/dL   Creatinine, Ser 1.06  0.50 - 1.35 mg/dL   Calcium 9.3  8.4 - 10.5 mg/dL   Total Protein 6.5  6.0 - 8.3 g/dL   Albumin 3.8  3.5 - 5.2 g/dL   AST 16  0 - 37 U/L   ALT 21  0 - 53 U/L   Alkaline Phosphatase 85  39 - 117 U/L   Total Bilirubin 0.7  0.3 - 1.2 mg/dL   GFR calc non Af Amer 65 (*) >90 mL/min   GFR calc Af Amer 75 (*) >90 mL/min   Comment: (NOTE)     The eGFR has been calculated using the CKD EPI equation.     This calculation has not been validated in all clinical situations.     eGFR's persistently <90 mL/min signify possible Chronic Kidney     Disease.  ETHANOL     Status: None   Collection Time    01/05/14 11:29 AM      Result Value Ref Range   Alcohol, Ethyl (B) <11  0 - 11 mg/dL   Comment:  LOWEST DETECTABLE LIMIT FOR     SERUM ALCOHOL IS 11 mg/dL     FOR MEDICAL PURPOSES ONLY  URINE RAPID DRUG SCREEN (HOSP PERFORMED)     Status: Abnormal   Collection Time    01/05/14 12:08 PM      Result Value Ref Range   Opiates NONE DETECTED  NONE DETECTED   Cocaine NONE DETECTED   NONE DETECTED   Benzodiazepines POSITIVE (*) NONE DETECTED   Amphetamines NONE DETECTED  NONE DETECTED   Tetrahydrocannabinol NONE DETECTED  NONE DETECTED   Barbiturates NONE DETECTED  NONE DETECTED   Comment:            DRUG SCREEN FOR MEDICAL PURPOSES     ONLY.  IF CONFIRMATION IS NEEDED     FOR ANY PURPOSE, NOTIFY LAB     WITHIN 5 DAYS.                LOWEST DETECTABLE LIMITS     FOR URINE DRUG SCREEN     Drug Class       Cutoff (ng/mL)     Amphetamine      1000     Barbiturate      200     Benzodiazepine   710     Tricyclics       626     Opiates          300     Cocaine          300     THC              50    Physical Findings: AIMS: Facial and Oral Movements Muscles of Facial Expression: None, normal Lips and Perioral Area: None, normal Jaw: None, normal Tongue: None, normal,Extremity Movements Upper (arms, wrists, hands, fingers): None, normal Lower (legs, knees, ankles, toes): None, normal, Trunk Movements Neck, shoulders, hips: None, normal, Overall Severity Severity of abnormal movements (highest score from questions above): None, normal Incapacitation due to abnormal movements: None, normal Patient's awareness of abnormal movements (rate only patient's report): No Awareness, Dental Status Current problems with teeth and/or dentures?: No Does patient usually wear dentures?: No  CIWA:    COWS:     Treatment Plan Summary: Daily contact with patient to assess and evaluate symptoms and progress in treatment Medication management  Plan: 1. Continue crisis management and stabilization. 2. Medication management to reduce current symptoms to base line and improve the patient's overall level of functioning. 3. Treat health problems as indicated. 4. Develop treatment plan to decrease risk of relapse upon discharge and to reduce the need for readmission. 5. Psycho-social education regarding relapse prevention and self care. 6. Health care follow up as needed for  medical problems. 7. Continue home medications where appropriate. 8. Continue current plan of care with no changes at this time.  Medical Decision Making Problem Points:  Established problem, stable/improving (1) Data Points:  Review or order clinical lab tests (1) Review and summation of old records (2)  I certify that inpatient services furnished can reasonably be expected to improve the patient's condition.   Marlane Hatcher. Jaeleen Inzunza [facility] 01/07/2014 6:27 PM

## 2014-01-07 NOTE — Progress Notes (Signed)
I agreed with the findings, treatment and disposition plan of this patient. Vignesh Willert, MD 

## 2014-01-07 NOTE — Progress Notes (Signed)
.  Psychoeducational Group Note    Date: 01/07/2014 Time: 0930   Goal Setting Purpose of Group: To be able to set a goal that is measurable and that can be accomplished in one day Participation Level:  Active  Participation Quality:  Attentive  Affect:  Appropriate  Cognitive:  Oriented  Insight:  Improving  Engagement in Group:  Engaged  Additional Comments:    Claudetta Sallie A  

## 2014-01-07 NOTE — Progress Notes (Signed)
Gary Osborn is acclamating to the unit..he is seeing better now that he is wearing his eye glasses. A He attended his Life SKills group and was engaged in the conversation. He later completed his morning self inventory and on it he wrote he denied SI within the previous 24 hrs . R Saafety is in place and poc onc as pt wrks on establishing DC plan.

## 2014-01-07 NOTE — [facility] Group Notes (Signed)
[place] LCSW Group Therapy  01/07/2014 2:39 PM  Type of Therapy:  Group Therapy  Participation Level:  Did Not Attend   Summary of Progress/Problems:  Today's group consisted of a conversation around Supportive Framework: What is a supportive framework? What does it look like feel like and how do I discern it from and unhealthy non-supportive network? Learn how to cope when supports are not helpful and don't support you. Discuss what to do when your family/friends are not supportive.  Gary Osborn left group once he realized the topic of was supportive framework. Gary Osborn reports this will not benefit him or help his situation, thus he excused himself from the group.  Gary Osborn 01/07/2014, 2:39 PM

## 2014-01-07 NOTE — Progress Notes (Signed)
Did not attend group 

## 2014-01-07 NOTE — Progress Notes (Signed)
Psychoeducational Group Note  Date: 01/07/2014 Time:  1015  Group Topic/Focus:  Identifying Needs:   The focus of this group is to help patients identify their personal needs that have been historically problematic and identify healthy behaviors to address their needs.  Participation Level:  Active  Participation Quality:  Appropriate  Affect:  Appropriate  Cognitive:  Oriented  Insight:  Improving  Engagement in Group:  Engaged  Additional Comments:    [name]  

## 2014-01-08 ENCOUNTER — Other Ambulatory Visit: Payer: Self-pay

## 2014-01-08 LAB — TSH: TSH: 4.853 u[IU]/mL — ABNORMAL HIGH (ref 0.350–4.500)

## 2014-01-08 NOTE — Progress Notes (Signed)
Adult Psychoeducational Group Note  Date:  01/08/2014 Time:  9:07 PM  Group Topic/Focus:  Wrap-Up Group:   The focus of this group is to help patients review their daily goal of treatment and discuss progress on daily workbooks.  Participation Level:  Active  Participation Quality:  Appropriate  Affect:  Appropriate  Cognitive:  Appropriate  Insight: Appropriate  Engagement in Group:  Engaged  Modes of Intervention:  Discussion  Additional Comments: The patient expressed that he had a encouraging day.  Nash Shearer 01/08/2014, 9:07 PM

## 2014-01-08 NOTE — Progress Notes (Signed)
Gary Osborn cont to deal with his depression. He is pleasant and cooperative. He completes his self inventory this morning and on it he wrote he denies SI within the past 24 hrs, he rated his depression and hopelessness  / 1 and said his DC plan includes " more effort".    A He attended his groups today and stated the Life SKills group was " strong". He shared personal feelings in the groups and is activly trying to develop healthier coping skills.   R He cont with poc and safety is in place.

## 2014-01-08 NOTE — B[facility] Group Notes (Addendum)
[place] LCSW Group Therapy  01/08/2014   1:15 PM   Type of Therapy:  Group Therapy  Participation Level:  Active  Participation Quality: Oriented and Attentive  Affect:  Flat and Depressed  Cognitive:  Alert and Appropriate  Insight:  Developing/Improving and Engaged  Engagement in Therapy:  Developing/Improving and Engaged  Modes of Intervention:  Clarification, Confrontation, Discussion, Education, Exploration, Limit-setting, Orientation, Problem-solving, Rapport Building, Art therapist, Socialization and Support  Summary of Progress/Problems: Today's group topic was avoiding self sabotage and enabling behaviors. Group members were asked to define self sabotage and enabling and provide examples. Group members were then asked to discuss unhealthy relationships and how to have positive healthy boundaries with those that enable. Group members were asked to process how communicating needs and establishing a plan to change the above identified behavior.  Pt shared that he is his own worst enemy and makes problems that are no existent, a problem.  When asked to elaborate further pt was unwilling to, but actively listened to group discussion.    Regan Lemming, LCSW 01/08/2014 1:58 PM

## 2014-01-08 NOTE — Progress Notes (Signed)
Pt is up and visible on the unit though somewhat withdrawn. Continues to exhibit anxiety and some mild confusion. EKG completed and it indicated NSR. Wife visited tonight and pt stated, "we just have different ideas. She supports me but we see things differently." Pt denies any physical pain or complaints. Pt supported, encouraged. Denies SI/HI/AVH and remains safe. Jamie Kato

## 2014-01-08 NOTE — Progress Notes (Signed)
EKG completed, transmitted and on chart. NSR. Jamie Kato

## 2014-01-08 NOTE — Progress Notes (Signed)
Patient ID: Gary Osborn, male   DOB: [birth year], 80 y.o.   MRN: [id number] [facility] MD Progress Note  01/08/2014 9:46 PM Gary Osborn  MRN:  [id number] Subjective:  Met with Beni 1 to 1 today to discuss his care and response to treatment. He says he is very worried that the [facility] department will pick him up on Monday for writing several bad checks. He states he can not get in contact with his banker to confirm that these checks to the IRS and his mortgage will bounce. 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 I have a great deal of trouble urinating in front of other men."  Patient also goes on to state that he is sedated from the medication and that has helped.  During today's assessment, pt reports anxiety at 8/10 and depression at 1/10, stating that he is "terrified that the IRS will take him to Prison on Monday or Tuesday due to bounced checks." Pt continues to ruminate about the IRS, bankers, and other financial concerns about CD accounts vs. Checking accounts. Gary Osborn has to be redirected constantly during assessment as he will get up to look for the "bank phone number" repeatedly. Pt denies SI, HI, and AVH, contracts for safety, but continues to obsess about his situation. Pt again mentions his concerns about a "small penis" and his "fear of urinating in front of men in prison". Pt confirms that he is able to urinate alone here at [facility].    Diagnosis:   DSM5: Schizophrenia Disorders:  Obsessive-Compulsive Disorders:  Trauma-Stressor Disorders:  Substance/Addictive Disorders:  Depressive Disorders:  AXIS I: Major Depression, Recurrent severe, Obsessive Compulsive Disorder and Psychotic Disorder NOS  AXIS II: Deferred  AXIS III:  Past Medical History   Diagnosis  Date   .  ALLERGIC RHINITIS  10/05/2007   .  ANXIETY  10/05/2007   .  COLONIC POLYPS, HX OF  10/05/2007    .  DEPRESSION  10/04/2008   .  PROSTATE CANCER, UNSPEC.  10/05/2007   .  SINUS BRADYCARDIA  10/04/2008    AXIS IV: other psychosocial or environmental problems, problems related to social environment and problems with primary support group  AXIS V: 41-50 serious symptoms  ADL's:  Intact  Sleep: Good  Appetite:  Fair  Suicidal Ideation:  denies Homicidal Ideation:  denies AEB (as evidenced by):  Psychiatric Specialty Exam: Physical Exam  ROS  Blood pressure 135/83, pulse 81, temperature 97.8 F (36.6 C), temperature source Oral, resp. rate 20, height 5\' 7"  (1.702 m), weight 87.091 kg (192 lb), SpO2 96.00%.Body mass index is 30.06 kg/(m^2).  General Appearance: Casual  Eye Contact::  Fair  Speech:  Clear and Coherent rambling, circumstantial  Volume:  Normal  Mood:  Anxious  Affect:  Congruent  Thought Process:  Disorganized  Orientation:  Full (Time, Place, and Person)  Thought Content:  Delusions  Suicidal Thoughts:  No  Homicidal Thoughts:  No  Memory:  NA  Judgement:  Impaired  Insight:  Lacking  Psychomotor Activity:  Normal  Concentration:  Poor  Recall:  Poor  Fund of Knowledge:Poor  Language: Good  Akathisia:  No  Handed:  Right  AIMS (if indicated):     Assets:  Communication Skills Desire for Improvement  Sleep:  Number of Hours: 6.75   Musculoskeletal: Strength & Muscle Tone: within normal  limits Gait & Station: normal Patient leans: N/A  Current Medications: Current Facility-Administered Medications  Medication Dose Route Frequency Provider Last Rate Last Dose  . busPIRone (BUSPAR) tablet 20 mg  20 mg Oral TID Shuvon Rankin, NP   20 mg at 01/08/14 1648  . clonazePAM (KLONOPIN) tablet 0.5 mg  0.5 mg Oral TID Shuvon Rankin, NP   0.5 mg at 01/08/14 1648  . magnesium hydroxide (MILK OF MAGNESIA) suspension 30 mL  30 mL Oral Daily PRN Shuvon Rankin, NP      . OLANZapine (ZYPREXA) tablet 10 mg  10 mg Oral QHS Nehemiah Settle, MD   10 mg at  01/08/14 2116  . ondansetron (ZOFRAN) tablet 4 mg  4 mg Oral Q8H PRN Shuvon Rankin, NP      . PARoxetine (PAXIL) tablet 30 mg  30 mg Oral QHS Nehemiah Settle, MD   30 mg at 01/08/14 2116  . temazepam (RESTORIL) capsule 15 mg  15 mg Oral QHS PRN Shuvon Rankin, NP   15 mg at 01/08/14 2116    Lab Results:  Results for orders placed during the hospital encounter of 01/05/14 (from the past 48 hour(s))  ACETAMINOPHEN LEVEL     Status: None   Collection Time    01/05/14 11:29 AM      Result Value Ref Range   Acetaminophen (Tylenol), Serum <15.0  10 - 30 ug/mL   Comment:            THERAPEUTIC CONCENTRATIONS VARY     SIGNIFICANTLY. A RANGE OF 10-30     ug/mL MAY BE AN EFFECTIVE     CONCENTRATION FOR MANY PATIENTS.     HOWEVER, SOME ARE BEST TREATED     AT CONCENTRATIONS OUTSIDE THIS     RANGE.     ACETAMINOPHEN CONCENTRATIONS     >150 ug/mL AT 4 HOURS AFTER     INGESTION AND >50 ug/mL AT 12     HOURS AFTER INGESTION ARE     OFTEN ASSOCIATED WITH TOXIC     REACTIONS.  CBC     Status: Abnormal   Collection Time    01/05/14 11:29 AM      Result Value Ref Range   WBC 3.2 (*) 4.0 - 10.5 K/uL   RBC 4.31  4.22 - 5.81 MIL/uL   Hemoglobin 14.3  13.0 - 17.0 g/dL   HCT 56.5  99.4 - 37.1 %   MCV 96.8  78.0 - 100.0 fL   MCH 33.2  26.0 - 34.0 pg   MCHC 34.3  30.0 - 36.0 g/dL   RDW 90.7  07.2 - 17.1 %   Platelets 124 (*) 150 - 400 K/uL  COMPREHENSIVE METABOLIC PANEL     Status: Abnormal   Collection Time    01/05/14 11:29 AM      Result Value Ref Range   Sodium 140  137 - 147 mEq/L   Potassium 4.3  3.7 - 5.3 mEq/L   Chloride 104  96 - 112 mEq/L   CO2 24  19 - 32 mEq/L   Glucose, Bld 201 (*) 70 - 99 mg/dL   BUN 17  6 - 23 mg/dL   Creatinine, Ser 1.65  0.50 - 1.35 mg/dL   Calcium 9.3  8.4 - 46.1 mg/dL   Total Protein 6.5  6.0 - 8.3 g/dL   Albumin 3.8  3.5 - 5.2 g/dL   AST 16  0 - 37 U/L   ALT 21  0 - 53  U/L   Alkaline Phosphatase 85  39 - 117 U/L   Total Bilirubin 0.7  0.3 -  1.2 mg/dL   GFR calc non Af Amer 65 (*) >90 mL/min   GFR calc Af Amer 75 (*) >90 mL/min   Comment: (NOTE)     The eGFR has been calculated using the CKD EPI equation.     This calculation has not been validated in all clinical situations.     eGFR's persistently <90 mL/min signify possible Chronic Kidney     Disease.  ETHANOL     Status: None   Collection Time    01/05/14 11:29 AM      Result Value Ref Range   Alcohol, Ethyl (B) <11  0 - 11 mg/dL   Comment:            LOWEST DETECTABLE LIMIT FOR     SERUM ALCOHOL IS 11 mg/dL     FOR MEDICAL PURPOSES ONLY  URINE RAPID DRUG SCREEN (HOSP PERFORMED)     Status: Abnormal   Collection Time    01/05/14 12:08 PM      Result Value Ref Range   Opiates NONE DETECTED  NONE DETECTED   Cocaine NONE DETECTED  NONE DETECTED   Benzodiazepines POSITIVE (*) NONE DETECTED   Amphetamines NONE DETECTED  NONE DETECTED   Tetrahydrocannabinol NONE DETECTED  NONE DETECTED   Barbiturates NONE DETECTED  NONE DETECTED   Comment:            DRUG SCREEN FOR MEDICAL PURPOSES     ONLY.  IF CONFIRMATION IS NEEDED     FOR ANY PURPOSE, NOTIFY LAB     WITHIN 5 DAYS.                LOWEST DETECTABLE LIMITS     FOR URINE DRUG SCREEN     Drug Class       Cutoff (ng/mL)     Amphetamine      1000     Barbiturate      200     Benzodiazepine   852     Tricyclics       778     Opiates          300     Cocaine          300     THC              50    Physical Findings: AIMS: Facial and Oral Movements Muscles of Facial Expression: None, normal Lips and Perioral Area: None, normal Jaw: None, normal Tongue: None, normal,Extremity Movements Upper (arms, wrists, hands, fingers): None, normal Lower (legs, knees, ankles, toes): None, normal, Trunk Movements Neck, shoulders, hips: None, normal, Overall Severity Severity of abnormal movements (highest score from questions above): None, normal Incapacitation due to abnormal movements: None, normal Patient's awareness  of abnormal movements (rate only patient's report): No Awareness, Dental Status Current problems with teeth and/or dentures?: No Does patient usually wear dentures?: No  CIWA:    COWS:     Treatment Plan Summary: Daily contact with patient to assess and evaluate symptoms and progress in treatment Medication management  Plan: 1. Continue crisis management and stabilization. 2. Medication management to reduce current symptoms to base line and improve the patient's overall level of functioning. 3. Treat health problems as indicated. 4. Develop treatment plan to decrease risk of relapse upon discharge and to reduce the need for readmission.  5. Psycho-social education regarding  relapse prevention and self care. 6. Health care follow up as needed for medical problems. 7. Continue home medications where appropriate. 8. Continue current plan of care with no changes at this time.  Medical Decision Making Problem Points:  Established problem, stable/improving (1) Data Points:  Review or order clinical lab tests (1) Review and summation of old records (2)  I certify that inpatient services furnished can reasonably be expected to improve the patient's condition.    Gary Osborn, [place], FNP-BC  01/08/2014 9:46 PM I agreed with the findings, treatment and disposition plan of this patient. Berniece Andreas, MD

## 2014-01-08 NOTE — Progress Notes (Signed)
Psychoeducational Group Note  Date: 01/08/2014 Time:  0930  Group Topic/Focus:  Gratefulness:  The focus of this group is to help patients identify what two things they are most grateful for in their lives. What helps ground them and to center them on their work to their recovery.  Participation Level:  Active  Participation Quality:  Appropriate  Affect:  Flat  Cognitive:  Oriented  Insight:  Improving  Engagement in Group:  Engaged  Additional Comments:    Paulino Rily

## 2014-01-08 NOTE — Progress Notes (Signed)
Psychoeducational Group Note  Date:  01/08/2014 Time:  1015  Group Topic/Focus:  Making Healthy Choices:   The focus of this group is to help patients identify negative/unhealthy choices they were using prior to admission and identify positive/healthier coping strategies to replace them upon discharge.  Participation Level:  Active  Participation Quality:  Appropriate  Affect:  Appropriate  Cognitive:  Appropriate  Insight:  Improving  Engagement in Group:  Engaged  Additional Comments:    Kolby Myung A 01/08/2014  

## 2014-01-09 ENCOUNTER — Other Ambulatory Visit: Payer: Self-pay

## 2014-01-09 MED ORDER — PAROXETINE HCL 20 MG PO TABS
40.0000 mg | ORAL_TABLET | Freq: Every day | ORAL | Status: DC
  Administered 2014-01-09 – 2014-01-10 (×2): 40 mg via ORAL
  Filled 2014-01-09 (×3): qty 2

## 2014-01-09 NOTE — [facility] Group Notes (Signed)
[place] LCSW Group Therapy  01/09/2014   1:15 PM   Type of Therapy:  Group Therapy  Participation Level:  Active  Participation Quality:  Attentive, Sharing and Supportive  Affect:  Anxious  Cognitive:  Alert and Oriented  Insight:  Developing/Improving and Engaged  Engagement in Therapy:  Developing/Improving and Engaged  Modes of Intervention:  Clarification, Confrontation, Discussion, Education, Exploration, Limit-setting, Orientation, Problem-solving, Rapport Building, Art therapist, Socialization and Support  Summary of Progress/Problems: Pt identified obstacles faced currently and processed barriers involved in overcoming these obstacles. Pt identified steps necessary for overcoming these obstacles and explored motivation (internal and external) for facing these difficulties head on. Pt further identified one area of concern in their lives and chose a goal to focus on for today.  During introductions pt began talking about his bank accounts again and how he will lose his insurance.  CSW had to redirected from talking about this throughout group.  Pt shared that his biggest obstacle is his finances and was able to identify that he easily obsesses about his finances.  Pt actively participated and was engaged in group discussion but had to be redirected to stay on group topic.    Regan Lemming, LCSW 01/09/2014  2:51 PM

## 2014-01-09 NOTE — Progress Notes (Signed)
Patient ID: Gary Osborn, male   DOB: June 06, 1933, 80 y.o.   MRN: [id number] Patient ID: Gary Osborn, male   DOB: 04-Jul-1933, 80 y.o.   MRN: [id number] [facility] MD Progress Note  01/09/2014 12:56 PM Gary Osborn  MRN:  [id number] Subjective:  Met with Gary Osborn 1 to 1 today to discuss his care and response to treatment. He says he is very worried that the [facility] department will pick him up on Monday for writing several bad checks. He states he can not get in contact with his banker to confirm that these checks to the IRS and his mortgage will bounce. 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 I have a great deal of trouble urinating in front of other men."  Patient also goes on to state that he is sedated from the medication and that has helped.  During today's assessment, patient stated that he has been sleeping better since her medication changed and also has a left symptoms are for depression and anxiety, rates anxiety at 6/10 and depression at 2/10. Patient continued to have obsessive thoughts about losing his medical insurance because he may not have sufficient phones in his account when his previous will be drawn. Patient stated he asked his family to look into bank account but he is calm he is not taking it serious. Patient reported he is not much worried about going to [facility] because now he knows he pees in his mind regarding writing bad checks to the IRS and also somebody told him that not going to jump in his case. Patient continues to ruminate about the IRS, bankers, and other financial concerns about CD accounts vs. Checking accounts. Patient continued to have a anxiety and mild depression but no current suicidal or homicidal ideation or auditory or visual hallucinations. Patient has paranoia, obsessions and excessive anxiety.  He continued to have a delusion about about  having a "small  penis" and his "fear of urinating in front of men in prison". Patient had 6.75 hours sleep last night. He has been tolerating his medication except one time he felt woozy and does not want to have a fall risk band and the special socks. Patient is tolerating his medication without much difficulty.  Diagnosis:   DSM5: Schizophrenia Disorders:  Obsessive-Compulsive Disorders:  Trauma-Stressor Disorders:  Substance/Addictive Disorders:  Depressive Disorders:  AXIS I: Major Depression, Recurrent severe, Obsessive Compulsive Disorder and Psychotic Disorder NOS  AXIS II: Deferred  AXIS III:  Past Medical History   Diagnosis  Date   .  ALLERGIC RHINITIS  10/05/2007   .  ANXIETY  10/05/2007   .  COLONIC POLYPS, HX OF  10/05/2007   .  DEPRESSION  10/04/2008   .  PROSTATE CANCER, UNSPEC.  10/05/2007   .  SINUS BRADYCARDIA  10/04/2008    AXIS IV: other psychosocial or environmental problems, problems related to social environment and problems with primary support group  AXIS V: 41-50 serious symptoms  ADL's:  Intact  Sleep: Good  Appetite:  Fair  Suicidal Ideation:  denies Homicidal Ideation:  denies AEB (as evidenced by):  Psychiatric Specialty Exam: Physical Exam  ROS  Blood pressure 147/84, pulse 71, temperature 97.8 F (36.6 C), temperature source Oral, resp. rate 18, height $RemoveBe'5\' 7"'NOydctZvA$  (1.702 m), weight 87.091 kg (192 lb), SpO2 96.00%.Body mass index is 30.06 kg/(m^2).  General Appearance: Casual  Eye Contact::  Fair  Speech:  Clear and Coherent rambling, circumstantial  Volume:  Normal  Mood:  Anxious  Affect:  Congruent  Thought Process:  Disorganized  Orientation:  Full (Time, Place, and Person)  Thought Content:  Delusions  Suicidal Thoughts:  No  Homicidal Thoughts:  No  Memory:  NA  Judgement:  Impaired  Insight:  Lacking  Psychomotor Activity:  Normal  Concentration:  Poor  Recall:  Poor  Fund of Knowledge:Poor  Language: Good  Akathisia:  No  Handed:  Right   AIMS (if indicated):     Assets:  Communication Skills Desire for Improvement  Sleep:  Number of Hours: 6.75   Musculoskeletal: Strength & Muscle Tone: within normal limits Gait & Station: normal Patient leans: N/A  Current Medications: Current Facility-Administered Medications  Medication Dose Route Frequency Provider Last Rate Last Dose  . busPIRone (BUSPAR) tablet 20 mg  20 mg Oral TID Shuvon Rankin, NP   20 mg at 01/09/14 1137  . clonazePAM (KLONOPIN) tablet 0.5 mg  0.5 mg Oral TID Shuvon Rankin, NP   0.5 mg at 01/09/14 1137  . magnesium hydroxide (MILK OF MAGNESIA) suspension 30 mL  30 mL Oral Daily PRN Shuvon Rankin, NP      . OLANZapine (ZYPREXA) tablet 10 mg  10 mg Oral QHS Durward Parcel, MD   10 mg at 01/08/14 2116  . ondansetron (ZOFRAN) tablet 4 mg  4 mg Oral Q8H PRN Shuvon Rankin, NP      . PARoxetine (PAXIL) tablet 30 mg  30 mg Oral QHS Durward Parcel, MD   30 mg at 01/08/14 2116  . temazepam (RESTORIL) capsule 15 mg  15 mg Oral QHS PRN Shuvon Rankin, NP   15 mg at 01/08/14 2116    Lab Results:  Results for orders placed during the hospital encounter of 01/05/14 (from the past 48 hour(s))  ACETAMINOPHEN LEVEL     Status: None   Collection Time    01/05/14 11:29 AM      Result Value Ref Range   Acetaminophen (Tylenol), Serum <15.0  10 - 30 ug/mL   Comment:            THERAPEUTIC CONCENTRATIONS VARY     SIGNIFICANTLY. A RANGE OF 10-30     ug/mL MAY BE AN EFFECTIVE     CONCENTRATION FOR MANY PATIENTS.     HOWEVER, SOME ARE BEST TREATED     AT CONCENTRATIONS OUTSIDE THIS     RANGE.     ACETAMINOPHEN CONCENTRATIONS     >150 ug/mL AT 4 HOURS AFTER     INGESTION AND >50 ug/mL AT 12     HOURS AFTER INGESTION ARE     OFTEN ASSOCIATED WITH TOXIC     REACTIONS.  CBC     Status: Abnormal   Collection Time    01/05/14 11:29 AM      Result Value Ref Range   WBC 3.2 (*) 4.0 - 10.5 K/uL   RBC 4.31  4.22 - 5.81 MIL/uL   Hemoglobin 14.3  13.0 -  17.0 g/dL   HCT 41.7  39.0 - 52.0 %   MCV 96.8  78.0 - 100.0 fL   MCH 33.2  26.0 - 34.0 pg   MCHC 34.3  30.0 - 36.0 g/dL   RDW 12.7  11.5 - 15.5 %   Platelets 124 (*) 150 - 400 K/uL  COMPREHENSIVE METABOLIC PANEL     Status: Abnormal  Collection Time    01/05/14 11:29 AM      Result Value Ref Range   Sodium 140  137 - 147 mEq/L   Potassium 4.3  3.7 - 5.3 mEq/L   Chloride 104  96 - 112 mEq/L   CO2 24  19 - 32 mEq/L   Glucose, Bld 201 (*) 70 - 99 mg/dL   BUN 17  6 - 23 mg/dL   Creatinine, Ser 1.06  0.50 - 1.35 mg/dL   Calcium 9.3  8.4 - 10.5 mg/dL   Total Protein 6.5  6.0 - 8.3 g/dL   Albumin 3.8  3.5 - 5.2 g/dL   AST 16  0 - 37 U/L   ALT 21  0 - 53 U/L   Alkaline Phosphatase 85  39 - 117 U/L   Total Bilirubin 0.7  0.3 - 1.2 mg/dL   GFR calc non Af Amer 65 (*) >90 mL/min   GFR calc Af Amer 75 (*) >90 mL/min   Comment: (NOTE)     The eGFR has been calculated using the CKD EPI equation.     This calculation has not been validated in all clinical situations.     eGFR's persistently <90 mL/min signify possible Chronic Kidney     Disease.  ETHANOL     Status: None   Collection Time    01/05/14 11:29 AM      Result Value Ref Range   Alcohol, Ethyl (B) <11  0 - 11 mg/dL   Comment:            LOWEST DETECTABLE LIMIT FOR     SERUM ALCOHOL IS 11 mg/dL     FOR MEDICAL PURPOSES ONLY  URINE RAPID DRUG SCREEN (HOSP PERFORMED)     Status: Abnormal   Collection Time    01/05/14 12:08 PM      Result Value Ref Range   Opiates NONE DETECTED  NONE DETECTED   Cocaine NONE DETECTED  NONE DETECTED   Benzodiazepines POSITIVE (*) NONE DETECTED   Amphetamines NONE DETECTED  NONE DETECTED   Tetrahydrocannabinol NONE DETECTED  NONE DETECTED   Barbiturates NONE DETECTED  NONE DETECTED   Comment:            DRUG SCREEN FOR MEDICAL PURPOSES     ONLY.  IF CONFIRMATION IS NEEDED     FOR ANY PURPOSE, NOTIFY LAB     WITHIN 5 DAYS.                LOWEST DETECTABLE LIMITS     FOR URINE DRUG  SCREEN     Drug Class       Cutoff (ng/mL)     Amphetamine      1000     Barbiturate      200     Benzodiazepine   751     Tricyclics       025     Opiates          300     Cocaine          300     THC              50    Physical Findings: AIMS: Facial and Oral Movements Muscles of Facial Expression: None, normal Lips and Perioral Area: None, normal Jaw: None, normal Tongue: None, normal,Extremity Movements Upper (arms, wrists, hands, fingers): None, normal Lower (legs, knees, ankles, toes): None, normal, Trunk Movements Neck, shoulders, hips: None, normal, Overall Severity Severity  of abnormal movements (highest score from questions above): None, normal Incapacitation due to abnormal movements: None, normal Patient's awareness of abnormal movements (rate only patient's report): No Awareness, Dental Status Current problems with teeth and/or dentures?: No Does patient usually wear dentures?: No  CIWA:    COWS:     Treatment Plan Summary: Daily contact with patient to assess and evaluate symptoms and progress in treatment Medication management  Plan: 1. Continue crisis management and stabilization. 2. Medication management to reduce current symptoms to base line and improve the patient's overall level of functioning. 3. Treat health problems as indicated. 4. Develop treatment plan to decrease risk of relapse upon discharge and to reduce the need for readmission.  Continue Zyprexa 10 mg at bedtime Continue with on 20 mg 3 times a day Discontinue clonazepam because of possible sedation and imbalance Increase Paxil to 40 mg for better control of options and anxiety Case discussed with the patient primary psychiatrist Dr. Casimiro Needle 5. Psycho-social education regarding relapse prevention and self care. 6. Health care follow up as needed for medical problems. 7. Continue home medications where appropriate. 8. Continue current plan of care with no changes at this time.  Medical  Decision Making Problem Points:  Established problem, stable/improving (1) Data Points:  Review or order clinical lab tests (1) Review and summation of old records (2)  I certify that inpatient services furnished can reasonably be expected to improve the patient's condition.     Ivin Rosenbloom,JANARDHAHA R. 01/09/2014 1:02 PM

## 2014-01-09 NOTE — Progress Notes (Signed)
Pt asleep, even, unlabored breathing. No signs of distress noted. q 15 min safety checks. Pt remains safe on unit

## 2014-01-09 NOTE — Progress Notes (Signed)
Adult Psychoeducational Group Note  Date:  01/09/2014 Time:  9:05 PM  Group Topic/Focus:  Wrap-Up Group:   The focus of this group is to help patients review their daily goal of treatment and discuss progress on daily workbooks.  Participation Level:  Active  Participation Quality:  Appropriate  Affect:  Appropriate  Cognitive:  Appropriate  Insight: Appropriate  Engagement in Group:  Engaged  Modes of Intervention:  Support  Additional Comments: Pt stated one good thing that happened was that his wife came to see him and he learned one of the rules of the hospital (can't have handkerchiefs))   Gary Osborn 01/09/2014, 9:05 PM

## 2014-01-09 NOTE — Progress Notes (Signed)
Patient ID: Gary Osborn, male   DOB: 1933/03/14, 80 y.o.   MRN: [id number] Pt visible in the milieu.  Attending groups.  Needs assessed.  Pt denied.  Support provided. Pt receptive.  Fifteen minute checks in progress for patient safety.  Pt safe on unit.

## 2014-01-09 NOTE — [facility] Group Notes (Signed)
[facility] LCSW Aftercare Discharge Planning Group Note   01/09/2014 8:45 AM  Participation Quality:  Alert, Appropriate and Oriented  Mood/Affect:  Calm  Depression Rating:  "high"  Anxiety Rating:  "high"  Thoughts of Suicide:  Pt denies SI/HI  Will you contract for safety?   Yes  Current AVH:  Pt denies  Plan for Discharge/Comments:  Pt attended discharge planning group and actively participated in group.  CSW provided pt with today's workbook.  Pt states that he is very worried about his bank accounts today, explaining that if he doesn't discharge today to take care of them, he will lose his insurance.  Pt had to be redirected numerous times due to being fixated on his bank accounts.  Pt will return home in [place] and has follow up scheduled at [facility] for medication management and therapy. No further needs voiced by pt at this time.     Transportation Means: Pt reports access to transportation - wife will pick pt up  Supports: No supports mentioned at this time  Regan Lemming, LCSW 01/09/2014 9:58 AM

## 2014-01-09 NOTE — Tx Team (Signed)
Interdisciplinary Treatment Plan Update (Adult)  Date: 01/09/2014  Time Reviewed:  9:45 AM  Progress in Treatment: Attending groups: Yes Participating in groups:  Yes Taking medication as prescribed:  Yes Tolerating medication:  Yes Family/Significant othe contact made: Yes, with pt's wife Patient understands diagnosis:  Yes Discussing patient identified problems/goals with staff:  Yes Medical problems stabilized or resolved:  Yes Denies suicidal/homicidal ideation: Yes Issues/concerns per patient self-inventory:  Yes Other:  New problem(s) identified: N/A  Discharge Plan or Barriers: Pt has follow up scheduled at [facility] for medication management and therapy.    Reason for Continuation of Hospitalization: Anxiety Depression Medication Stabilization  Comments: N/A  Estimated length of stay: 2-3 days  For review of initial/current patient goals, please see plan of care.  Attendees: Patient:     Family:     Physician:  Dr. Zorita Pang 01/09/2014 10:03 AM   Nursing:   Grayland Ormond, RN 01/09/2014 10:03 AM   Clinical Social Worker:  Regan Lemming, LCSW 01/09/2014 10:03 AM   Other: Nena Polio, PA 01/09/2014 10:03 AM   Other:  Gala Romney, care coordination 01/09/2014 10:03 AM   Other:  Joette Catching, LCSW 01/09/2014 10:03 AM   Other:  Gaylan Gerold, RN 01/09/2014 10:03 AM   Other: Thurnell Garbe, RN 01/09/2014 10:03 AM   Other: Lars Pinks, case manager 01/09/2014 10:05 AM   Other:    Other:    Other:    Other:     Scribe for Treatment Team:   Ane Payment, 01/09/2014 10:03 AM

## 2014-01-09 NOTE — Progress Notes (Signed)
Patient ID: Gary Osborn, male   DOB: October 05, 1933, 80 y.o.   MRN: [id number] PER STATE REGULATIONS 482.30  THIS CHART WAS REVIEWED FOR MEDICAL NECESSITY WITH RESPECT TO THE PATIENT'S ADMISSION/ DURATION OF STAY.  NEXT REVIEW DATE: 01/12/2014  Chauncy Lean, RN, BSN CASE MANAGER

## 2014-01-09 NOTE — Progress Notes (Signed)
Patient ID: Gary Osborn, male   DOB: November 21, 1932, 80 y.o.   MRN: [id number]  D: Pt. Denies SI/HI and A/V Hallucinations. Patient rates his depression and hopelessness at 5/10 for the day. Patient does not report any pain or discomfort at this time. Patient is delusional because he continues to report that he is scared about his bank account and insurance as well as his CD's at bank. Patient's reports that he wrote "bad checks" and this is what is causing the problem with the bank account.  Patient states that his wife no longer wants to talk to him about this.  A: Support and encouragement provided to the patient. Scheduled medications given to patient per physician's orders.  R: Patient is receptive and cooperative but minimal. Patient is seen on the unit and is attending groups. Q15 minute checks are maintained for safety.

## 2014-01-10 NOTE — Progress Notes (Signed)
D:  Patient up in the milieu some, but does stay in his room a lot too.  Says he would like to wear his shoes but they do not fit over his yellow socks.  He was also worried about going to dinner as he was told that the NP wanted to do some memory testing on him.   A:  Patient was told that he could wear his shoes with his regular socks as long as he had his yellow arm band on.  Encouraged him to participate in all groups and to be out of his room a bit more.   R:  Cooperative with staff.  Anxious and worried, but responds well to reassurance.  Denies suicidal thoughts.  Safety is maintained.

## 2014-01-10 NOTE — [facility] Group Notes (Signed)
[place] LCSW Group Therapy  01/10/2014   1:15 PM   Type of Therapy:  Group Therapy  Participation Level:  Active  Participation Quality:  Attentive, Sharing and Supportive  Affect:  Calm  Cognitive:  Alert and Oriented  Insight:  Developing/Improving and Engaged  Engagement in Therapy:  Developing/Improving and Engaged  Modes of Intervention:  Activity, Clarification, Confrontation, Discussion, Education, Exploration, Limit-setting, Orientation, Problem-solving, Rapport Building, Art therapist, Socialization and Support  Summary of Progress/Problems: Patient was attentive and engaged with speaker from [place].  Patient was attentive to speaker while they shared their story of dealing with mental health and overcoming it.  Patient expressed interest in their programs and services and received information on their agency.  Patient processed ways they can relate to the speaker.     Regan Lemming, LCSW 01/10/2014  2:33 PM

## 2014-01-10 NOTE — Progress Notes (Signed)
Adult Psychoeducational Group Note  Date:  01/10/2014 Time:  9:24 PM  Group Topic/Focus:  Wrap-Up Group:   The focus of this group is to help patients review their daily goal of treatment and discuss progress on daily workbooks.  Participation Level:  Active  Participation Quality:  Appropriate  Affect:  Appropriate  Cognitive:  Appropriate  Insight: Appropriate  Engagement in Group:  Engaged  Modes of Intervention:  Discussion  Additional Comments:  Patient was present for wrap up group. He reported that finances are one of his major stressors. He also stated that he tried to discuss this with his wife, but she was not receptive. I told him that his social worker would be a good ally in confronting his financial issues, and he should make a note in his journal to revisit these issues tomorrow . He also said that he had a good day and was happy that he passed a cognitive test that he took today and he was happy that he got to see his wife.   Harrie Foreman A 01/10/2014, 9:24 PM

## 2014-01-10 NOTE — Progress Notes (Signed)
Recreation Therapy Notes  Animal-Assisted Activity/Therapy (AAA/T) Program Checklist/Progress Notes Patient Eligibility Criteria Checklist & Daily Group note for Rec Tx Intervention  Date: 03.30.2015 Time: 2:45pm Location: [address] Dayroom    AAA/T Program Assumption of Risk Form signed by Patient/ or Parent Legal Guardian yes  Patient is free of allergies or sever asthma yes  Patient reports no fear of animals yes  Patient reports no history of cruelty to animals yes   Patient understands his/her participation is voluntary yes  Behavioral Response: Did not attend.   Dee Maday L Alandria Butkiewicz, LRT/CTRS  Hektor Huston L 01/10/2014 5:14 PM 

## 2014-01-10 NOTE — Progress Notes (Signed)
Patient ID: Gary Osborn, male   DOB: 25-Jul-1933, 80 y.o.   MRN: [id number] [facility] MD Progress Note  01/10/2014 5:23 PM Gary Osborn  MRN:  [id number] Subjective:  Met with Gary Osborn 1 to 1 today to discuss his care and response to treatment. He says he is very worried that the [facility] department will pick him up on Monday for writing several bad checks. He states he can not get in contact with his banker to confirm that these checks to the IRS and his mortgage will bounce. 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 I have a great deal of trouble urinating in front of other men."  Patient also goes on to state that he is sedated from the medication and that has helped.  During today's assessment, patient rates anxiety at 5/10 and depression at 2/10. Pt denies SI, HI, and AVH, contracts for safety. Today, pt is alert, oriented, cooperative, and in NAD. Pt states that he feels much calmer and more balanced and that he is still worried about the IRS but that he is not as worried as he was. Pt reports improved sleep and appetite. Asked pt if he was willing to participate in mini mental status exam; pt agreed to do so after meal.    Diagnosis:   DSM5: Schizophrenia Disorders:  Obsessive-Compulsive Disorders:  Trauma-Stressor Disorders:  Substance/Addictive Disorders:  Depressive Disorders:  AXIS I: Major Depression, Recurrent severe, Obsessive Compulsive Disorder and Psychotic Disorder NOS  AXIS II: Deferred  AXIS III:  Past Medical History   Diagnosis  Date   .  ALLERGIC RHINITIS  10/05/2007   .  ANXIETY  10/05/2007   .  COLONIC POLYPS, HX OF  10/05/2007   .  DEPRESSION  10/04/2008   .  PROSTATE CANCER, UNSPEC.  10/05/2007   .  SINUS BRADYCARDIA  10/04/2008    AXIS IV: other psychosocial or environmental problems, problems related to social environment and problems with primary  support group  AXIS V: 41-50 serious symptoms  ADL's:  Intact  Sleep: Good  Appetite:  Fair  Suicidal Ideation:  denies Homicidal Ideation:  denies AEB (as evidenced by):  Psychiatric Specialty Exam: Physical Exam  ROS  Blood pressure 119/80, pulse 76, temperature 98.9 F (37.2 C), temperature source Oral, resp. rate 16, height $RemoveBe'5\' 7"'ppvGObDXQ$  (1.702 m), weight 87.091 kg (192 lb), SpO2 96.00%.Body mass index is 30.06 kg/(m^2).  General Appearance: Casual  Eye Contact::  Fair  Speech:  Clear and Coherent rambling, circumstantial  Volume:  Normal  Mood:  Anxious  Affect:  Congruent  Thought Process:  Disorganized  Orientation:  Full (Time, Place, and Person)  Thought Content:  Delusions  Suicidal Thoughts:  No  Homicidal Thoughts:  No  Memory:  NA  Judgement:  Impaired  Insight:  Lacking  Psychomotor Activity:  Normal  Concentration:  Poor  Recall:  Poor  Fund of Knowledge:Poor  Language: Good  Akathisia:  No  Handed:  Right  AIMS (if indicated):     Assets:  Communication Skills Desire for Improvement  Sleep:  Number of Hours: 6.5   Musculoskeletal: Strength & Muscle Tone: within normal limits Gait & Station: normal Patient leans: N/A  Current Medications: Current Facility-Administered Medications  Medication Dose Route Frequency Provider Last Rate Last Dose  . busPIRone (BUSPAR) tablet 20 mg  20 mg Oral TID  Shuvon Rankin, NP   20 mg at 01/10/14 1714  . magnesium hydroxide (MILK OF MAGNESIA) suspension 30 mL  30 mL Oral Daily PRN Shuvon Rankin, NP   30 mL at 01/10/14 0748  . OLANZapine (ZYPREXA) tablet 10 mg  10 mg Oral QHS Durward Parcel, MD   10 mg at 01/09/14 2128  . ondansetron (ZOFRAN) tablet 4 mg  4 mg Oral Q8H PRN Shuvon Rankin, NP      . PARoxetine (PAXIL) tablet 40 mg  40 mg Oral QHS Durward Parcel, MD   40 mg at 01/09/14 2128  . temazepam (RESTORIL) capsule 15 mg  15 mg Oral QHS PRN Shuvon Rankin, NP   15 mg at 01/09/14 2128    Lab  Results:  Results for orders placed during the hospital encounter of 01/05/14 (from the past 48 hour(s))  ACETAMINOPHEN LEVEL     Status: None   Collection Time    01/05/14 11:29 AM      Result Value Ref Range   Acetaminophen (Tylenol), Serum <15.0  10 - 30 ug/mL   Comment:            THERAPEUTIC CONCENTRATIONS VARY     SIGNIFICANTLY. A RANGE OF 10-30     ug/mL MAY BE AN EFFECTIVE     CONCENTRATION FOR MANY PATIENTS.     HOWEVER, SOME ARE BEST TREATED     AT CONCENTRATIONS OUTSIDE THIS     RANGE.     ACETAMINOPHEN CONCENTRATIONS     >150 ug/mL AT 4 HOURS AFTER     INGESTION AND >50 ug/mL AT 12     HOURS AFTER INGESTION ARE     OFTEN ASSOCIATED WITH TOXIC     REACTIONS.  CBC     Status: Abnormal   Collection Time    01/05/14 11:29 AM      Result Value Ref Range   WBC 3.2 (*) 4.0 - 10.5 K/uL   RBC 4.31  4.22 - 5.81 MIL/uL   Hemoglobin 14.3  13.0 - 17.0 g/dL   HCT 41.7  39.0 - 52.0 %   MCV 96.8  78.0 - 100.0 fL   MCH 33.2  26.0 - 34.0 pg   MCHC 34.3  30.0 - 36.0 g/dL   RDW 12.7  11.5 - 15.5 %   Platelets 124 (*) 150 - 400 K/uL  COMPREHENSIVE METABOLIC PANEL     Status: Abnormal   Collection Time    01/05/14 11:29 AM      Result Value Ref Range   Sodium 140  137 - 147 mEq/L   Potassium 4.3  3.7 - 5.3 mEq/L   Chloride 104  96 - 112 mEq/L   CO2 24  19 - 32 mEq/L   Glucose, Bld 201 (*) 70 - 99 mg/dL   BUN 17  6 - 23 mg/dL   Creatinine, Ser 1.06  0.50 - 1.35 mg/dL   Calcium 9.3  8.4 - 10.5 mg/dL   Total Protein 6.5  6.0 - 8.3 g/dL   Albumin 3.8  3.5 - 5.2 g/dL   AST 16  0 - 37 U/L   ALT 21  0 - 53 U/L   Alkaline Phosphatase 85  39 - 117 U/L   Total Bilirubin 0.7  0.3 - 1.2 mg/dL   GFR calc non Af Amer 65 (*) >90 mL/min   GFR calc Af Amer 75 (*) >90 mL/min   Comment: (NOTE)     The eGFR has been calculated  using the CKD EPI equation.     This calculation has not been validated in all clinical situations.     eGFR's persistently <90 mL/min signify possible Chronic  Kidney     Disease.  ETHANOL     Status: None   Collection Time    01/05/14 11:29 AM      Result Value Ref Range   Alcohol, Ethyl (B) <11  0 - 11 mg/dL   Comment:            LOWEST DETECTABLE LIMIT FOR     SERUM ALCOHOL IS 11 mg/dL     FOR MEDICAL PURPOSES ONLY  URINE RAPID DRUG SCREEN (HOSP PERFORMED)     Status: Abnormal   Collection Time    01/05/14 12:08 PM      Result Value Ref Range   Opiates NONE DETECTED  NONE DETECTED   Cocaine NONE DETECTED  NONE DETECTED   Benzodiazepines POSITIVE (*) NONE DETECTED   Amphetamines NONE DETECTED  NONE DETECTED   Tetrahydrocannabinol NONE DETECTED  NONE DETECTED   Barbiturates NONE DETECTED  NONE DETECTED   Comment:            DRUG SCREEN FOR MEDICAL PURPOSES     ONLY.  IF CONFIRMATION IS NEEDED     FOR ANY PURPOSE, NOTIFY LAB     WITHIN 5 DAYS.                LOWEST DETECTABLE LIMITS     FOR URINE DRUG SCREEN     Drug Class       Cutoff (ng/mL)     Amphetamine      1000     Barbiturate      200     Benzodiazepine   200     Tricyclics       300     Opiates          300     Cocaine          300     THC              50    Physical Findings: AIMS: Facial and Oral Movements Muscles of Facial Expression: None, normal Lips and Perioral Area: None, normal Jaw: None, normal Tongue: None, normal,Extremity Movements Upper (arms, wrists, hands, fingers): None, normal Lower (legs, knees, ankles, toes): None, normal, Trunk Movements Neck, shoulders, hips: None, normal, Overall Severity Severity of abnormal movements (highest score from questions above): None, normal Incapacitation due to abnormal movements: None, normal Patient's awareness of abnormal movements (rate only patient's report): No Awareness, Dental Status Current problems with teeth and/or dentures?: No Does patient usually wear dentures?: No  CIWA:    COWS:     Treatment Plan Summary: Daily contact with patient to assess and evaluate symptoms and progress in  treatment Medication management  Plan: 1. Continue crisis management and stabilization. 2. Medication management to reduce current symptoms to base line and improve the patient's overall level of functioning. 3. Treat health problems as indicated. 4. Develop treatment plan to decrease risk of relapse upon discharge and to reduce the need for readmission.  Continue Zyprexa 10 mg at bedtime Continue with Buspar 20 mg 3 times a day Continue Paxil to 40 mg for better control of options and anxiety Case discussed with the patient primary psychiatrist Dr. Donell Beers  5. Psycho-social education regarding relapse prevention and self care. 6. Health care follow up as needed for medical problems. 7. Continue home  medications where appropriate. 8. Continue current plan of care with no changes at this time.  Medical Decision Making Problem Points:  Established problem, stable/improving (1) Data Points:  Review or order clinical lab tests (1) Review and summation of old records (2)  I certify that inpatient services furnished can reasonably be expected to improve the patient's condition.     Vicki, Pasqual, FNP-BC 01/10/2014 5:23 PM  Reviewed the information documented and agree with the treatment plan.  Gabor Lusk,JANARDHAHA R. 01/11/2014 11:40 AM

## 2014-01-10 NOTE — BHH Group Notes (Signed)
Adult Psychoeducational Group Note  Date:  01/10/2014 Time:  0900am  Group Topic/Focus:  Goals Group:   The focus of this group is to help patients establish daily goals to achieve during treatment and discuss how the patient can incorporate goal setting into their daily lives to aide in recovery. Orientation:   The focus of this group is to educate the patient on the purpose and policies of crisis stabilization and provide a format to answer questions about their admission.  The group details unit policies and expectations of patients while admitted.  Participation Level:  None  Participation Quality:  Appropriate and Attentive  Affect:  Flat  Cognitive:  Alert and Appropriate  Insight: Appropriate  Engagement in Group:  Engaged  Modes of Intervention:  Discussion, Orientation and Support  Additional Comments:  Pt sat quietly and attentively during group, did not verbally share but was alert and engaged while others shared.  Wynn Banker 01/10/2014, 11:07 AM

## 2014-01-10 NOTE — Progress Notes (Signed)
D:  Per pt self inventory pt reports sleeping fair, appetite good, energy level low, ability to pay attention good, rates depression at a 5 out of 10 and hopelessness at a 5 out of 10, denies SI/HI/AVH, c/o mild constipation, pt still flat and depressed during interaction.     A:  Emotional support provided, Encouraged pt to continue with treatment plan and attend all group activities, q15 min checks maintained for safety.  R:  Pt is going to groups, calm and cooperative with staff and other patients, minimal sharing but attentive during groups, pt plans to exercise more once he is discharged to better take care of himself.

## 2014-01-10 NOTE — Consult Note (Signed)
Face to face evaluation and I agree with this note 

## 2014-01-11 MED ORDER — BUSPIRONE HCL 10 MG PO TABS
20.0000 mg | ORAL_TABLET | Freq: Three times a day (TID) | ORAL | Status: DC
Start: 2014-01-11 — End: 2014-06-29

## 2014-01-11 MED ORDER — OLANZAPINE 10 MG PO TABS: 10.0000 mg | ORAL_TABLET | Freq: Every day | ORAL | Status: DC

## 2014-01-11 MED ORDER — PAROXETINE HCL 40 MG PO TABS: 40.0000 mg | ORAL_TABLET | Freq: Every day | ORAL | Status: DC

## 2014-01-11 MED ORDER — TEMAZEPAM 15 MG PO CAPS
15.0000 mg | ORAL_CAPSULE | Freq: Every evening | ORAL | Status: DC | PRN
Start: 2014-01-11 — End: 2014-06-29

## 2014-01-11 NOTE — Tx Team (Signed)
Interdisciplinary Treatment Plan Update (Adult)  Date: 01/11/2014  Time Reviewed:  9:45 AM  Progress in Treatment: Attending groups: Yes Participating in groups:  Yes Taking medication as prescribed:  Yes Tolerating medication:  Yes Family/Significant othe contact made: Yes, with pt's wife Patient understands diagnosis:  Yes Discussing patient identified problems/goals with staff:  Yes Medical problems stabilized or resolved:  Yes Denies suicidal/homicidal ideation: Yes Issues/concerns per patient self-inventory:  Yes Other:  New problem(s) identified: N/A  Discharge Plan or Barriers: Pt will follow up at [facility] for medication management  Reason for Continuation of Hospitalization: Stable to d/c today  Comments: N/A  Estimated length of stay: D/C today  For review of initial/current patient goals, please see plan of care.  Attendees: Patient:  Gary Osborn  01/11/2014 9:56 AM   Family:     Physician:  Dr. Zorita Pang 01/11/2014 9:45 AM   Nursing:   Gaylan Gerold, RN 01/11/2014 9:45 AM   Clinical Social Worker:  Regan Lemming, LCSW 01/11/2014 9:45 AM   Other: Catalina Pizza, PA 01/11/2014 9:45 AM   Other:  Gala Romney, care coordination 01/11/2014 9:45 AM   Other:  Joette Catching, LCSW 01/11/2014 9:45 AM   Other:  Rosana Fret, RN 01/11/2014 9:46 AM   Other: Grayland Ormond, RN 01/11/2014 9:46 AM   Other: Jeanella Anton, nursing student 01/11/2014 9:46 AM   Other:    Other:    Other:    Other:     Scribe for Treatment Team:   Ane Payment, 01/11/2014 9:45 AM

## 2014-01-11 NOTE — Progress Notes (Signed)
D: Patient resting in bed with eyes closed.  Respirations even and unlabored.  Patient appears to be in no apparent distress. A: Staff to monitor Q 15 mins for safety.   R:Patient remains safe on the unit.  

## 2014-01-11 NOTE — Progress Notes (Signed)
Patient ID: Gary Osborn, male   DOB: 12-24-1932, 80 y.o.   MRN: [id number]  Pt. Denies SI/HI and A/V hallucinations. Patient reports that he is ready to go and thanks [place] for our care. Belongings returned to patient at time of discharge. Patient denies any pain or discomfort. Discharge instructions and medications were reviewed with patient. Patient verbalized understanding of both medications and discharge instructions. Q15 minute safety checks until discharge. No distress noted upon discharge.

## 2014-01-11 NOTE — BHH Group Notes (Signed)
[facility] LCSW Aftercare Discharge Planning Group Note   01/11/2014 8:45 AM  Participation Quality:  Alert, Appropriate and Oriented  Mood/Affect:  Calm  Depression Rating:  5  Anxiety Rating:  5  Thoughts of Suicide:  Pt denies SI/HI  Will you contract for safety?   Yes  Current AVH:  Pt denies  Plan for Discharge/Comments:  Pt attended discharge planning group and actively participated in group.  CSW provided pt with today's workbook.  Pt reports feeling "fair" today and ready to d/c. Pt will return home in [place] and has follow up scheduled at [facility] for medication management. No further needs voiced by pt at this time.     Transportation Means: Pt reports access to transportation - wife will pick pt up  Supports: No supports mentioned at this time  Regan Lemming, [place] 01/11/2014 9:40 AM

## 2014-01-11 NOTE — Progress Notes (Signed)
[facility] Adult Case Management Discharge Plan :  Will you be returning to the same living situation after discharge: Yes,  returning home At discharge, do you have transportation home?:Yes,  wife will pick pt up Do you have the ability to pay for your medications:Yes,  provided pt with prescriptions and pt verbalizes ability to afford meds  Release of information consent forms completed and in the chart;  Patient's signature needed at discharge.  Patient to Follow up at: Follow-up Information   Follow up with [facility] On 01/23/2014. (Appointment scheduled at 1:30 pm on this date with Dr. Casimiro Needle for medication management)    Contact information:   [address] [place], [place] [address] Phone: [phone] Fax: [phone]      Patient denies SI/HI:   Yes,  denies SI/HI    Safety Planning and Suicide Prevention discussed:  Yes,  discussed with pt and pt's wife.  See suicide prevention education note.   Ane Payment 01/11/2014, 9:59 AM

## 2014-01-11 NOTE — BHH Suicide Risk Assessment (Signed)
   Demographic Factors:  Male, Age 80 or older and Caucasian  Total Time spent with patient: 30 minutes  Psychiatric Specialty Exam: Physical Exam  ROS  Blood pressure 133/78, pulse 64, temperature 98 F (36.7 C), temperature source Oral, resp. rate 18, height 5\' 7"  (1.702 m), weight 87.091 kg (192 lb), SpO2 96.00%.Body mass index is 30.06 kg/(m^2).  General Appearance: Casual  Eye Contact::  Good  Speech:  Clear and Coherent  Volume:  normal  Mood:  Euthymic  Affect:  Appropriate and Congruent  Thought Process:  Coherent and Goal Directed  Orientation:  Full (Time, Place, and Person)  Thought Content:  WDL  Suicidal Thoughts:  No  Homicidal Thoughts:  No  Memory:  Immediate;   Fair Recent;   Fair  Judgement:  Good  Insight:  Good  Psychomotor Activity:  Normal  Concentration:  Good  Recall:  Good  Fund of Knowledge:Good  Language: Good  Akathisia:  NA  Handed:  Right  AIMS (if indicated):     Assets:  Communication Skills Desire for Improvement Financial Resources/Insurance Housing Intimacy Leisure Time Physical Health Resilience Social Support Talents/Skills Transportation Vocational/Educational  Sleep:  Number of Hours: 6.5    Musculoskeletal: Strength & Muscle Tone: within normal limits Gait & Station: normal Patient leans: N/A   Mental Status Per Nursing Assessment::   On Admission:     Current Mental Status by Physician: NA  Loss Factors: Financial problems/change in socioeconomic status  Historical Factors: NA  Risk Reduction Factors:   Sense of responsibility to family, Religious beliefs about death, Living with another person, especially a relative, Positive social support, Positive therapeutic relationship and Positive coping skills or problem solving skills  Continued Clinical Symptoms:  Severe Anxiety and/or Agitation Depression:   Recent sense of peace/wellbeing Previous Psychiatric Diagnoses and Treatments Medical Diagnoses and  Treatments/Surgeries  Cognitive Features That Contribute To Risk:  Polarized thinking    Suicide Risk:  Minimal: No identifiable suicidal ideation.  Patients presenting with no risk factors but with morbid ruminations; may be classified as minimal risk based on the severity of the depressive symptoms  Discharge Diagnoses:   AXIS I:  Major Depression, Recurrent severe and Obsessive Compulsive Disorder AXIS II:  Deferred AXIS III:   Past Medical History  Diagnosis Date  . ALLERGIC RHINITIS 10/05/2007  . ANXIETY 10/05/2007  . COLONIC POLYPS, HX OF 10/05/2007  . DEPRESSION 10/04/2008  . PROSTATE CANCER, UNSPEC. 10/05/2007  . SINUS BRADYCARDIA 10/04/2008   AXIS IV:  other psychosocial or environmental problems, problems related to social environment and problems with primary support group AXIS V:  61-70 mild symptoms  Plan Of Care/Follow-up recommendations:  Activity:  as tolerated Diet:  regular  Is patient on multiple antipsychotic therapies at discharge:  No   Has Patient had three or more failed trials of antipsychotic monotherapy by history:  No  Recommended Plan for Multiple Antipsychotic Therapies: NA    Gary Osborn,Gary Osborn. 01/11/2014, 12:44 PM

## 2014-01-11 NOTE — Discharge Summary (Signed)
Physician Discharge Summary Note  Patient:  Gary Osborn is an 80 y.o., male MRN:  [id number] DOB:  May 25, 1933 Patient phone:  [phone] (home)  Patient address:   [address] [place] [address],  Total Time spent with patient: Greater than 30 minutes  Date of Admission:  01/05/2014 Date of Discharge: 01/11/2014  Reason for Admission:  MDD, OCD, with SI  Discharge Diagnoses: Active Problems:   MDD (major depressive disorder), recurrent, severe, with psychosis   Psychiatric Specialty Exam: Physical Exam  Review of Systems  Constitutional: Negative.   HENT: Negative.   Eyes: Negative.   Respiratory: Negative.   Cardiovascular: Negative.   Gastrointestinal: Negative.   Genitourinary: Negative.   Musculoskeletal: Negative.   Skin: Negative.   Neurological: Negative.   Endo/Heme/Allergies: Negative.   Psychiatric/Behavioral: Positive for depression. The patient is nervous/anxious.     Blood pressure 133/78, pulse 64, temperature 98 F (36.7 C), temperature source Oral, resp. rate 18, height 5\' 7"  (1.702 m), weight 87.091 kg (192 lb), SpO2 96.00%.Body mass index is 30.06 kg/(m^2).  General Appearance: Casual  Eye Contact::  Good  Speech:  Clear and Coherent  Volume:  Normal  Mood:  Anxious  Affect:  Appropriate  Thought Process:  Coherent  Orientation:  Full (Time, Place, and Person)  Thought Content:  Obsessions and Rumination  Suicidal Thoughts:  No  Homicidal Thoughts:  No  Memory:  Immediate;   Fair Recent;   Fair Remote;   Fair  Judgement:  Fair  Insight:  Good  Psychomotor Activity:  Normal  Concentration:  Good  Recall:  Emmons of Knowledge:Good  Language: Good  Akathisia:  NA  Handed:  Right  AIMS (if indicated):     Assets:  Communication Skills Desire for Improvement Social Support  Sleep:  Number of Hours: 6.5   Musculoskeletal: Strength & Muscle Tone: within normal limits Gait & Station: normal Patient leans:  N/A  DSM5:  Obsessive-Compulsive Disorders:  OCD Depressive Disorders:  Major Depressive Disorder - with Psychotic Features (296.24)  Axis Diagnosis:   AXIS I:  Major Depression, Recurrent severe, Oppositional Defiant Disorder and Psychotic Disorder NOS AXIS II:  Deferred AXIS III:   Past Medical History  Diagnosis Date  . ALLERGIC RHINITIS 10/05/2007  . ANXIETY 10/05/2007  . COLONIC POLYPS, HX OF 10/05/2007  . DEPRESSION 10/04/2008  . PROSTATE CANCER, UNSPEC. 10/05/2007  . SINUS BRADYCARDIA 10/04/2008   AXIS IV:  other psychosocial or environmental problems and problems related to social environment AXIS V:  61-70 mild symptoms  Level of Care:  OP  Hospital Course:  Gary Osborn is an 80 y.o. male admitted voluntarily and emergently from [facility] with history of OCD, anxiety and depression and recent episode of suicide with suicidal behavior of walking into traffic which was prevented by his family. He was also referred to [facility] for an assessment by his psychiatric Dr. Marchelle Osborn and his wife accompanied for the assessment. Patient endorses symptoms of depression, severe anxiety, psychosis especially delusions that he was broke financially and police is after him for writing bad checks to [facility] etc. He admits to a past history of SI and attempts/gestures. He states that in January 2015 he attempted to cut his throat and also tried to jump into traffic. He was hospitalized at [facility] for these 2 attempts. He also reports 1 other prior admission at the age of 80 in [place]. That admission was due to OCD behaviors. Today the patient's main issue is  obsessive thoughts about his finances. Patient speaks of financial issues related to his taxes, bank accounts, and social security checks. His spouse reports that he is delusional and making bad decisions. Apparently patient also gave a car away to a friend with no compensation, stating that he has three car and talking about giving one to  his best friend for about a year. Also, he took this person to the bank to have the title of the car notarized. His spouse sts that taking people that the bank could put him at risk of making further bad decisions.    During Hospitalization: Medications managed, psychoeducation, group and individual therapy. Pt currently denies SI, HI, and Psychosis. At discharge, pt minimizing depression/anxiety symptoms but does not appear anxious at this time. Pt states that he does have a good supportive home environment and will followup with outpatient treatment. Affirms agreement with medication regimen and discharge plan. Denies other physical and psychological concerns at time of discharge.   Consults:  None  Significant Diagnostic Studies:  None  Discharge Vitals:   Blood pressure 133/78, pulse 64, temperature 98 F (36.7 C), temperature source Oral, resp. rate 18, height 5\' 7"  (1.702 m), weight 87.091 kg (192 lb), SpO2 96.00%. Body mass index is 30.06 kg/(m^2). Lab Results:   No results found for this or any previous visit (from the past 72 hour(s)).  Physical Findings: AIMS: Facial and Oral Movements Muscles of Facial Expression: None, normal Lips and Perioral Area: None, normal Jaw: None, normal Tongue: None, normal,Extremity Movements Upper (arms, wrists, hands, fingers): None, normal Lower (legs, knees, ankles, toes): None, normal, Trunk Movements Neck, shoulders, hips: None, normal, Overall Severity Severity of abnormal movements (highest score from questions above): None, normal Incapacitation due to abnormal movements: None, normal Patient's awareness of abnormal movements (rate only patient's report): No Awareness, Dental Status Current problems with teeth and/or dentures?: No Does patient usually wear dentures?: No  CIWA:    COWS:     Psychiatric Specialty Exam: See Psychiatric Specialty Exam and Suicide Risk Assessment completed by Attending Physician prior to  discharge.  Discharge destination:  Home  Is patient on multiple antipsychotic therapies at discharge:  No   Has Patient had three or more failed trials of antipsychotic monotherapy by history:  No  Recommended Plan for Multiple Antipsychotic Therapies: NA   Future Appointments Provider Department Dept Phone   10/16/2014 8:45 AM Marletta Lor, MD [place] [facility]       Medication List    STOP taking these medications       clonazePAM 0.5 MG tablet  Commonly known as:  KLONOPIN      TAKE these medications     Indication   busPIRone 10 MG tablet  Commonly known as:  BUSPAR  Take 2 tablets (20 mg total) by mouth 3 (three) times daily.   Indication:  mood stabilization     OLANZapine 10 MG tablet  Commonly known as:  ZYPREXA  Take 1 tablet (10 mg total) by mouth at bedtime.   Indication:  mood stabilization     PARoxetine 40 MG tablet  Commonly known as:  PAXIL  Take 1 tablet (40 mg total) by mouth at bedtime.   Indication:  mood stabilization     temazepam 15 MG capsule  Commonly known as:  RESTORIL  Take 1 capsule (15 mg total) by mouth at bedtime as needed for sleep.   Indication:  Trouble Sleeping  Follow-up Information   Follow up with [facility] On 01/23/2014. (Appointment scheduled at 1:30 pm on this date with Dr. Casimiro Needle for medication management)    Contact information:   [address] [place], [place] [address] Phone: [phone] Fax: [phone]      Follow-up recommendations:  Activity:  As tolerated Diet:  Heart healthy with low sodium.  Comments:   Take all medications as prescribed. Keep all follow-up appointments as scheduled.  Do not consume alcohol or use illegal drugs while on prescription medications. Report any adverse effects from your medications to your primary care provider promptly.  In the event of recurrent symptoms or worsening symptoms, call 911, a crisis hotline,  or go to the nearest emergency department for evaluation.   Total Discharge Time:  Greater than 30 minutes.  Signed: Alee, Katen, FNP-BC 01/11/2014, 12:41 PM  Patient was seen face to face for psychiatric evaluation and suicide risk assessment and case discussed with physician extender and made appropriate disposition plan. Reviewed the information documented and agree with the treatment plan.Lisette Grinder R. 01/14/2014 11:27 AM

## 2014-01-11 NOTE — [facility] Group Notes (Signed)
[facility] LCSW Group Therapy  01/11/2014  1:15 PM   Type of Therapy:  Group Therapy  Participation Level:  Active  Participation Quality:  Attentive, Sharing and Supportive  Affect:  Calm  Cognitive:  Alert and Oriented  Insight:  Developing/Improving and Engaged  Engagement in Therapy:  Developing/Improving and Engaged  Modes of Intervention:  Clarification, Confrontation, Discussion, Education, Exploration, Limit-setting, Orientation, Problem-solving, Rapport Building, Art therapist, Socialization and Support  Summary of Progress/Problems: The topic for group today was emotional regulation.  This group focused on both positive and negative emotion identification and allowed group members to process ways to identify feelings, regulate negative emotions, and find healthy ways to manage internal/external emotions. Group members were asked to reflect on a time when their reaction to an emotion led to a negative outcome and explored how alternative responses using emotion regulation would have benefited them. Group members were also asked to discuss a time when emotion regulation was utilized when a negative emotion was experienced.  Pt did not share personally by actively listened to group discussion.    Regan Lemming, LCSW 01/11/2014  2:21 PM

## 2014-01-12 NOTE — Progress Notes (Signed)
Patient Discharge Instructions:  After Visit Summary (AVS):   Faxed to:  01/12/14 Psychiatric Admission Assessment Note:   Faxed to:  01/12/14 Suicide Risk Assessment - Discharge Assessment:   Faxed to:  01/12/14 Faxed/Sent to the Next Level Care provider:  01/12/14 Faxed to [facility] @ [place], 01/12/2014, 3:58 PM

## 2014-01-17 DIAGNOSIS — F3342 Major depressive disorder, recurrent, in full remission: Secondary | ICD-10-CM | POA: Diagnosis not present

## 2014-01-20 ENCOUNTER — Inpatient Hospital Stay: Payer: Self-pay | Admitting: Psychiatry

## 2014-01-20 DIAGNOSIS — Z79899 Other long term (current) drug therapy: Secondary | ICD-10-CM | POA: Diagnosis not present

## 2014-01-20 DIAGNOSIS — F09 Unspecified mental disorder due to known physiological condition: Secondary | ICD-10-CM | POA: Diagnosis not present

## 2014-01-20 DIAGNOSIS — Z01810 Encounter for preprocedural cardiovascular examination: Secondary | ICD-10-CM | POA: Diagnosis not present

## 2014-01-20 DIAGNOSIS — F0280 Dementia in other diseases classified elsewhere without behavioral disturbance: Secondary | ICD-10-CM | POA: Diagnosis present

## 2014-01-20 DIAGNOSIS — F0281 Dementia in other diseases classified elsewhere with behavioral disturbance: Secondary | ICD-10-CM | POA: Diagnosis not present

## 2014-01-20 DIAGNOSIS — F429 Obsessive-compulsive disorder, unspecified: Secondary | ICD-10-CM | POA: Diagnosis not present

## 2014-01-20 DIAGNOSIS — F333 Major depressive disorder, recurrent, severe with psychotic symptoms: Secondary | ICD-10-CM | POA: Diagnosis not present

## 2014-01-20 DIAGNOSIS — F339 Major depressive disorder, recurrent, unspecified: Secondary | ICD-10-CM | POA: Diagnosis not present

## 2014-01-20 DIAGNOSIS — F0390 Unspecified dementia without behavioral disturbance: Secondary | ICD-10-CM | POA: Diagnosis present

## 2014-01-20 DIAGNOSIS — Z01818 Encounter for other preprocedural examination: Secondary | ICD-10-CM | POA: Diagnosis not present

## 2014-01-20 DIAGNOSIS — Z87820 Personal history of traumatic brain injury: Secondary | ICD-10-CM | POA: Diagnosis not present

## 2014-01-20 DIAGNOSIS — R45851 Suicidal ideations: Secondary | ICD-10-CM | POA: Diagnosis not present

## 2014-01-20 DIAGNOSIS — K5900 Constipation, unspecified: Secondary | ICD-10-CM | POA: Diagnosis not present

## 2014-01-20 DIAGNOSIS — F312 Bipolar disorder, current episode manic severe with psychotic features: Secondary | ICD-10-CM | POA: Diagnosis not present

## 2014-01-20 DIAGNOSIS — G9389 Other specified disorders of brain: Secondary | ICD-10-CM | POA: Diagnosis not present

## 2014-01-20 DIAGNOSIS — G4700 Insomnia, unspecified: Secondary | ICD-10-CM | POA: Diagnosis not present

## 2014-01-20 DIAGNOSIS — F02818 Dementia in other diseases classified elsewhere, unspecified severity, with other behavioral disturbance: Secondary | ICD-10-CM | POA: Diagnosis not present

## 2014-01-20 LAB — BEHAVIORAL MEDICINE 1 PANEL
ALBUMIN: 4 g/dL (ref 3.4–5.0)
ALK PHOS: 82 U/L
ALT: 26 U/L (ref 12–78)
ANION GAP: 4 — AB (ref 7–16)
BASOS PCT: 0.5 %
BUN: 19 mg/dL — ABNORMAL HIGH (ref 7–18)
Basophil #: 0 10*3/uL (ref 0.0–0.1)
Bilirubin,Total: 1.3 mg/dL — ABNORMAL HIGH (ref 0.2–1.0)
CALCIUM: 8.6 mg/dL (ref 8.5–10.1)
CO2: 28 mmol/L (ref 21–32)
Chloride: 107 mmol/L (ref 98–107)
Creatinine: 1.03 mg/dL (ref 0.60–1.30)
EGFR (African American): >60
EGFR (Non-African Amer.): >60
Eosinophil #: 0 10*3/uL (ref 0.0–0.7)
Eosinophil %: 0.1 %
Glucose: 104 mg/dL — ABNORMAL HIGH (ref 65–99)
HCT: 41.7 % (ref 40.0–52.0)
HGB: 13.6 g/dL (ref 13.0–18.0)
LYMPHS PCT: 16.1 %
Lymphocyte #: 0.7 10*3/uL — ABNORMAL LOW (ref 1.0–3.6)
MCH: 32.1 pg (ref 26.0–34.0)
MCHC: 32.6 g/dL (ref 32.0–36.0)
MCV: 99 fL (ref 80–100)
Monocyte #: 0.3 x10 3/mm (ref 0.2–1.0)
Monocyte %: 7.2 %
Neutrophil #: 3.4 10*3/uL (ref 1.4–6.5)
Neutrophil %: 76.1 %
Osmolality: 280 (ref 275–301)
POTASSIUM: 3.8 mmol/L (ref 3.5–5.1)
Platelet: 139 10*3/uL — ABNORMAL LOW (ref 150–440)
RBC: 4.23 10*6/uL — ABNORMAL LOW (ref 4.40–5.90)
RDW: 12.7 % (ref 11.5–14.5)
SGOT(AST): 22 U/L (ref 15–37)
Sodium: 139 mmol/L (ref 136–145)
Thyroid Stimulating Horm: 2.65 u[IU]/mL
Total Protein: 6.9 g/dL (ref 6.4–8.2)
WBC: 4.4 10*3/uL (ref 3.8–10.6)

## 2014-01-21 LAB — URINALYSIS, COMPLETE
Bacteria: NONE SEEN
Bilirubin,UR: NEGATIVE
Blood: NEGATIVE
Glucose,UR: NEGATIVE mg/dL (ref 0–75)
KETONE: NEGATIVE
Leukocyte Esterase: NEGATIVE
NITRITE: NEGATIVE
PH: 7 (ref 4.5–8.0)
Protein: NEGATIVE
RBC,UR: NONE SEEN /HPF (ref 0–5)
SPECIFIC GRAVITY: 1.014 (ref 1.003–1.030)
Squamous Epithelial: NONE SEEN

## 2014-02-21 DIAGNOSIS — F3342 Major depressive disorder, recurrent, in full remission: Secondary | ICD-10-CM | POA: Diagnosis not present

## 2014-03-08 DIAGNOSIS — D235 Other benign neoplasm of skin of trunk: Secondary | ICD-10-CM | POA: Diagnosis not present

## 2014-03-08 DIAGNOSIS — L819 Disorder of pigmentation, unspecified: Secondary | ICD-10-CM | POA: Diagnosis not present

## 2014-03-08 DIAGNOSIS — D1801 Hemangioma of skin and subcutaneous tissue: Secondary | ICD-10-CM | POA: Diagnosis not present

## 2014-03-08 DIAGNOSIS — L821 Other seborrheic keratosis: Secondary | ICD-10-CM | POA: Diagnosis not present

## 2014-03-13 DIAGNOSIS — F3342 Major depressive disorder, recurrent, in full remission: Secondary | ICD-10-CM | POA: Diagnosis not present

## 2014-04-07 DIAGNOSIS — C61 Malignant neoplasm of prostate: Secondary | ICD-10-CM | POA: Diagnosis not present

## 2014-04-13 DIAGNOSIS — N393 Stress incontinence (female) (male): Secondary | ICD-10-CM | POA: Diagnosis not present

## 2014-04-13 DIAGNOSIS — C61 Malignant neoplasm of prostate: Secondary | ICD-10-CM | POA: Diagnosis not present

## 2014-05-08 DIAGNOSIS — F3342 Major depressive disorder, recurrent, in full remission: Secondary | ICD-10-CM | POA: Diagnosis not present

## 2014-05-29 DIAGNOSIS — F3342 Major depressive disorder, recurrent, in full remission: Secondary | ICD-10-CM | POA: Diagnosis not present

## 2014-06-29 ENCOUNTER — Ambulatory Visit (INDEPENDENT_AMBULATORY_CARE_PROVIDER_SITE_OTHER): Payer: Medicare Other | Admitting: Internal Medicine

## 2014-06-29 ENCOUNTER — Encounter: Payer: Self-pay | Admitting: Internal Medicine

## 2014-06-29 VITALS — BP 120/80 | HR 118 | Temp 98.0°F | Resp 20 | Ht 67.0 in | Wt 182.0 lb

## 2014-06-29 DIAGNOSIS — F329 Major depressive disorder, single episode, unspecified: Secondary | ICD-10-CM | POA: Diagnosis not present

## 2014-06-29 DIAGNOSIS — Z23 Encounter for immunization: Secondary | ICD-10-CM

## 2014-06-29 DIAGNOSIS — K5900 Constipation, unspecified: Secondary | ICD-10-CM

## 2014-06-29 DIAGNOSIS — F3289 Other specified depressive episodes: Secondary | ICD-10-CM

## 2014-06-29 DIAGNOSIS — F411 Generalized anxiety disorder: Secondary | ICD-10-CM | POA: Diagnosis not present

## 2014-06-29 NOTE — Patient Instructions (Signed)

## 2014-06-29 NOTE — Progress Notes (Signed)
Pre visit review using our clinic review tool, if applicable. No additional management support is needed unless otherwise documented below in the visit note. 

## 2014-06-29 NOTE — Progress Notes (Signed)
Subjective:    Patient ID: Gary Osborn, male    DOB: 1932/11/26, 80 y.o.   MRN: [id number]  HPI 80 year old patient who is followed closely by psychiatry.  He was hospitalized in the spring at [facility] and received ECT therapy at that time. His chief complaint today is constipation.  He admits to be in the less active, but has adherent to a fairly high fiber diet.  He states that he has a bowel movement daily or every other day and but he feels he must strain to have results.  Stool is not described as particularly hard or bulky  Past Medical History  Diagnosis Date  . ALLERGIC RHINITIS 10/05/2007  . ANXIETY 10/05/2007  . COLONIC POLYPS, HX OF 10/05/2007  . DEPRESSION 10/04/2008  . PROSTATE CANCER, UNSPEC. 10/05/2007  . SINUS BRADYCARDIA 10/04/2008    History   Social History  . Marital Status: Married    Spouse Name: N/A    Number of Children: 2  . Years of Education: N/A   Occupational History  . Retired    Social History Main Topics  . Smoking status: Former Smoker    Quit date: 10/13/1973  . Smokeless tobacco: Never Used  . Alcohol Use: 7.0 oz/week    14 drink(s) per week     Comment: glass of chardonnay each night  . Drug Use: No  . Sexual Activity: Not on file   Other Topics Concern  . Not on file   Social History Narrative  . No narrative on file    Past Surgical History  Procedure Laterality Date  . Tonsillectomy    . Prostate surgery    . Nasal sinus surgery      Family History  Problem Relation Age of Onset  . Prostate cancer Father   . Breast cancer Sister     No Known Allergies  Current Outpatient Prescriptions on File Prior to Visit  Medication Sig Dispense Refill  . OLANZapine (ZYPREXA) 10 MG tablet Take 1 tablet (10 mg total) by mouth at bedtime.  30 tablet  0  . PARoxetine (PAXIL) 40 MG tablet Take 1 tablet (40 mg total) by mouth at bedtime.  30 tablet  0   No current facility-administered medications on file prior to  visit.    BP 120/80  Pulse 118  Temp(Src) 98 F (36.7 C) (Oral)  Resp 20  Ht 5\' 7"  (1.702 m)  Wt 182 lb (82.555 kg)  BMI 28.50 kg/m2  SpO2 98%      Review of Systems  Constitutional: Negative for fever, chills, appetite change and fatigue.  HENT: Negative for congestion, dental problem, ear pain, hearing loss, sore throat, tinnitus, trouble swallowing and voice change.   Eyes: Negative for pain, discharge and visual disturbance.  Respiratory: Negative for cough, chest tightness, wheezing and stridor.   Cardiovascular: Negative for chest pain, palpitations and leg swelling.  Gastrointestinal: Positive for constipation. Negative for nausea, vomiting, abdominal pain, diarrhea, blood in stool and abdominal distention.  Genitourinary: Negative for urgency, hematuria, flank pain, discharge, difficulty urinating and genital sores.  Musculoskeletal: Negative for arthralgias, back pain, gait problem, joint swelling, myalgias and neck stiffness.  Skin: Negative for rash.  Neurological: Negative for dizziness, syncope, speech difficulty, weakness, numbness and headaches.  Hematological: Negative for adenopathy. Does not bruise/bleed easily.  Psychiatric/Behavioral: Positive for sleep disturbance and dysphoric mood. Negative for behavioral problems. The patient is nervous/anxious.        Objective:   Physical Exam  Constitutional: He is oriented to person, place, and time. He appears well-developed.  HENT:  Head: Normocephalic.  Right Ear: External ear normal.  Left Ear: External ear normal.  Eyes: Conjunctivae and EOM are normal.  Neck: Normal range of motion.  Cardiovascular: Normal rate and normal heart sounds.   Resting tachycardia  Pulmonary/Chest: Breath sounds normal.  Abdominal: Soft. Bowel sounds are normal. He exhibits no distension and no mass. There is no tenderness. There is no rebound and no guarding.  Musculoskeletal: Normal range of motion. He exhibits no edema and  no tenderness.  Neurological: He is alert and oriented to person, place, and time.  Psychiatric: He has a normal mood and affect. His behavior is normal.          Assessment & Plan:   Mild constipation.  Situation discussed at length with more rigorous activity level.  He'll attempt to consume more fluids and a high fiber diet.  Information dispensed. He was told he could use MiraLax as needed MDD/Psychiatric disorder.  Followup psychiatry Sinus tachycardia.  History of palpitations and sinus bradycardia.  Patient was told to discontinue all caffeine use.  Apparently, he has cut back considerably since his hospital admission Schedule CPX

## 2014-07-03 DIAGNOSIS — F3342 Major depressive disorder, recurrent, in full remission: Secondary | ICD-10-CM | POA: Diagnosis not present

## 2014-07-13 DIAGNOSIS — F251 Schizoaffective disorder, depressive type: Secondary | ICD-10-CM | POA: Diagnosis not present

## 2014-07-17 ENCOUNTER — Telehealth: Payer: Self-pay | Admitting: Internal Medicine

## 2014-07-17 DIAGNOSIS — K5900 Constipation, unspecified: Secondary | ICD-10-CM

## 2014-07-17 NOTE — Telephone Encounter (Signed)
Okay to do referral to GI for constipation?

## 2014-07-17 NOTE — Telephone Encounter (Signed)
Pt was seen on 9-17 and would like a referral to gi specialist for constipation. Can we refer?

## 2014-07-17 NOTE — Telephone Encounter (Signed)
Spoke to pt told him referral to Gi was done and someone will contact him for an appointment. Pt verbalized understanding.

## 2014-07-17 NOTE — Telephone Encounter (Signed)
ok 

## 2014-07-28 ENCOUNTER — Encounter: Payer: Self-pay | Admitting: Gastroenterology

## 2014-07-28 ENCOUNTER — Ambulatory Visit (INDEPENDENT_AMBULATORY_CARE_PROVIDER_SITE_OTHER): Payer: Medicare Other | Admitting: Gastroenterology

## 2014-07-28 VITALS — BP 136/76 | HR 78 | Ht 61.0 in | Wt 179.2 lb

## 2014-07-28 DIAGNOSIS — R194 Change in bowel habit: Secondary | ICD-10-CM | POA: Diagnosis not present

## 2014-07-28 NOTE — Progress Notes (Signed)
Review of pertinent gastrointestinal problems: 1. Routine risk for colon cancer 09/2005 Colonoscopy, Dr. Ardis Hughs: lipomas but no polyps.  Recall at 10 years.  HPI: This is a  pleasant 80 year old man whom I last saw about a year ago for unrelated issue.  He's noticed a change in his bowel habits.  Tried to have BMs in AM, usually had to push, strain.  Never really has an urge to go now.    No overt blood in his stool.   Tried miralax this didn't help. Took it only one day.  BMs are usually very firm, hard to push out.  Started metamucil over past 6 weeks (powder tid).   Past Medical History  Diagnosis Date  . ALLERGIC RHINITIS 10/05/2007  . ANXIETY 10/05/2007  . COLONIC POLYPS, HX OF 10/05/2007  . DEPRESSION 10/04/2008  . PROSTATE CANCER, UNSPEC. 10/05/2007  . SINUS BRADYCARDIA 10/04/2008    Past Surgical History  Procedure Laterality Date  . Tonsillectomy    . Prostate surgery    . Nasal sinus surgery      Current Outpatient Prescriptions  Medication Sig Dispense Refill  . buPROPion (WELLBUTRIN XL) 300 MG 24 hr tablet Take 300 mg by mouth daily.      . busPIRone (BUSPAR) 10 MG tablet Take 10 mg by mouth 2 (two) times daily.      Marland Kitchen LORazepam (ATIVAN) 0.5 MG tablet Take 0.5 mg by mouth 2 (two) times daily.      Marland Kitchen OLANZapine (ZYPREXA) 10 MG tablet Take 1 tablet (10 mg total) by mouth at bedtime.  30 tablet  0  . PARoxetine (PAXIL) 40 MG tablet Take 1 tablet (40 mg total) by mouth at bedtime.  30 tablet  0  . risperiDONE (RISPERDAL) 0.5 MG tablet Take 0.5 mg by mouth at bedtime.      . temazepam (RESTORIL) 15 MG capsule Take 30 mg by mouth at bedtime as needed for sleep.       No current facility-administered medications for this visit.    Allergies as of 07/28/2014  . (No Known Allergies)    Family History  Problem Relation Age of Onset  . Prostate cancer Father   . Breast cancer Sister     History   Social History  . Marital Status: Married    Spouse Name:  N/A    Number of Children: 2  . Years of Education: N/A   Occupational History  . Retired    Social History Main Topics  . Smoking status: Former Smoker    Quit date: 10/13/1973  . Smokeless tobacco: Never Used  . Alcohol Use: 7.0 oz/week    14 drink(s) per week     Comment: glass of chardonnay each night  . Drug Use: No  . Sexual Activity: Not on file   Other Topics Concern  . Not on file   Social History Narrative  . No narrative on file      Physical Exam: BP 136/76  Pulse 78  Ht 5\' 1"  (1.549 m)  Wt 179 lb 3.2 oz (81.285 kg)  BMI 33.88 kg/m2  SpO2 98% Constitutional: generally well-appearing Psychiatric: alert and oriented x3 Abdomen: soft, nontender, nondistended, no obvious ascites, no peritoneal signs, normal bowel sounds     Assessment and plan: 80 y.o. male with change in his bowel habits  Is very clear that he has had a distinct change in his bowel habits. He attributes this to trying to force himself to have a bowel movement a  certain time every morning. Perhaps that is the case I instructed his bowel habits have significantly changed and I recommended to him very clearly that he should undergo colonoscopy check for obstructing lesions. He is not at all interested in that and in fact he said he would welcome colon cancer. He does understand diabetes and cancer in his still declines colonoscopy. That we will focus on trying to his bowels. He will add MiraLax to his daily regimen. He will use an enema to rescue after for 5 days. He will continue on Metamucil daily. He will return to see me as needed and he will call to update you bowel changes in 4-5 weeks.

## 2014-07-28 NOTE — Patient Instructions (Addendum)
Dr. Ardis Hughs recommended you to have a colonoscopy for your significant change in your bowel habits, but you declined.  You understand we could be missing a cancer of the colon. Stay on metamucil 2-3 times per day. Start miralax (one dose every day). If you don't have BM in 3-4 days and are getting uncomfortable, try a single enema (fleets enema, OTC). Do not push, strain very hard to move your bowels. IF no improvement in 4-5 weeks, please call. Could try an oral agent (linzess) to get your bowels moving.

## 2014-08-14 DIAGNOSIS — F251 Schizoaffective disorder, depressive type: Secondary | ICD-10-CM | POA: Diagnosis not present

## 2014-09-08 ENCOUNTER — Encounter (HOSPITAL_COMMUNITY): Payer: Self-pay | Admitting: *Deleted

## 2014-09-08 ENCOUNTER — Emergency Department (EMERGENCY_DEPARTMENT_HOSPITAL)
Admission: EM | Admit: 2014-09-08 | Discharge: 2014-09-14 | Disposition: A | Discharge status: Other Acute Inpatient Hospital | Payer: Medicare Other | Source: Home / Self Care | Attending: Emergency Medicine | Admitting: Emergency Medicine

## 2014-09-08 DIAGNOSIS — Z609 Problem related to social environment, unspecified: Secondary | ICD-10-CM | POA: Diagnosis present

## 2014-09-08 DIAGNOSIS — Z87891 Personal history of nicotine dependence: Secondary | ICD-10-CM

## 2014-09-08 DIAGNOSIS — F1310 Sedative, hypnotic or anxiolytic abuse, uncomplicated: Secondary | ICD-10-CM | POA: Insufficient documentation

## 2014-09-08 DIAGNOSIS — F332 Major depressive disorder, recurrent severe without psychotic features: Secondary | ICD-10-CM

## 2014-09-08 DIAGNOSIS — F419 Anxiety disorder, unspecified: Secondary | ICD-10-CM | POA: Insufficient documentation

## 2014-09-08 DIAGNOSIS — R45851 Suicidal ideations: Secondary | ICD-10-CM

## 2014-09-08 DIAGNOSIS — Z9151 Personal history of suicidal behavior: Secondary | ICD-10-CM

## 2014-09-08 DIAGNOSIS — Z86010 Personal history of colonic polyps: Secondary | ICD-10-CM | POA: Insufficient documentation

## 2014-09-08 DIAGNOSIS — Z9889 Other specified postprocedural states: Secondary | ICD-10-CM

## 2014-09-08 DIAGNOSIS — T1491 Suicide attempt: Secondary | ICD-10-CM | POA: Diagnosis not present

## 2014-09-08 DIAGNOSIS — Z8546 Personal history of malignant neoplasm of prostate: Secondary | ICD-10-CM

## 2014-09-08 DIAGNOSIS — Z79899 Other long term (current) drug therapy: Secondary | ICD-10-CM

## 2014-09-08 DIAGNOSIS — Z8042 Family history of malignant neoplasm of prostate: Secondary | ICD-10-CM | POA: Diagnosis not present

## 2014-09-08 DIAGNOSIS — Z23 Encounter for immunization: Secondary | ICD-10-CM | POA: Diagnosis not present

## 2014-09-08 DIAGNOSIS — F333 Major depressive disorder, recurrent, severe with psychotic symptoms: Secondary | ICD-10-CM | POA: Diagnosis not present

## 2014-09-08 DIAGNOSIS — Z915 Personal history of self-harm: Secondary | ICD-10-CM | POA: Diagnosis not present

## 2014-09-08 DIAGNOSIS — F42 Obsessive-compulsive disorder: Secondary | ICD-10-CM | POA: Diagnosis not present

## 2014-09-08 DIAGNOSIS — Z818 Family history of other mental and behavioral disorders: Secondary | ICD-10-CM | POA: Diagnosis not present

## 2014-09-08 DIAGNOSIS — F322 Major depressive disorder, single episode, severe without psychotic features: Secondary | ICD-10-CM | POA: Diagnosis present

## 2014-09-08 DIAGNOSIS — F05 Delirium due to known physiological condition: Secondary | ICD-10-CM | POA: Diagnosis not present

## 2014-09-08 DIAGNOSIS — X838XXA Intentional self-harm by other specified means, initial encounter: Secondary | ICD-10-CM

## 2014-09-08 DIAGNOSIS — K5900 Constipation, unspecified: Secondary | ICD-10-CM | POA: Diagnosis present

## 2014-09-08 DIAGNOSIS — F411 Generalized anxiety disorder: Secondary | ICD-10-CM | POA: Diagnosis present

## 2014-09-08 DIAGNOSIS — Z751 Person awaiting admission to adequate facility elsewhere: Secondary | ICD-10-CM

## 2014-09-08 DIAGNOSIS — G4700 Insomnia, unspecified: Secondary | ICD-10-CM | POA: Diagnosis not present

## 2014-09-08 DIAGNOSIS — F23 Brief psychotic disorder: Secondary | ICD-10-CM | POA: Diagnosis not present

## 2014-09-08 LAB — COMPREHENSIVE METABOLIC PANEL
ALBUMIN: 4 g/dL (ref 3.5–5.2)
ALT: 19 U/L (ref 0–53)
AST: 19 U/L (ref 0–37)
Alkaline Phosphatase: 100 U/L (ref 39–117)
Anion gap: 15 (ref 5–15)
BUN: 18 mg/dL (ref 6–23)
CO2: 24 meq/L (ref 19–32)
Calcium: 9.3 mg/dL (ref 8.4–10.5)
Chloride: 97 mEq/L (ref 96–112)
Creatinine, Ser: 1.11 mg/dL (ref 0.50–1.35)
GFR calc Af Amer: 71 mL/min — ABNORMAL LOW (ref >90)
GFR calc non Af Amer: 61 mL/min — ABNORMAL LOW (ref >90)
Glucose, Bld: 78 mg/dL (ref 70–99)
Potassium: 4.9 mEq/L (ref 3.7–5.3)
SODIUM: 136 meq/L — AB (ref 137–147)
TOTAL PROTEIN: 6.9 g/dL (ref 6.0–8.3)
Total Bilirubin: 0.4 mg/dL (ref 0.3–1.2)

## 2014-09-08 LAB — SALICYLATE LEVEL

## 2014-09-08 LAB — CBC
HCT: 43.7 % (ref 39.0–52.0)
HEMOGLOBIN: 15 g/dL (ref 13.0–17.0)
MCH: 32.8 pg (ref 26.0–34.0)
MCHC: 34.3 g/dL (ref 30.0–36.0)
MCV: 95.6 fL (ref 78.0–100.0)
PLATELETS: 133 10*3/uL — AB (ref 150–400)
RBC: 4.57 MIL/uL (ref 4.22–5.81)
RDW: 12.8 % (ref 11.5–15.5)
WBC: 4.3 10*3/uL (ref 4.0–10.5)

## 2014-09-08 LAB — ACETAMINOPHEN LEVEL: Acetaminophen (Tylenol), Serum: <15 ug/mL (ref 10–30)

## 2014-09-08 LAB — ETHANOL: Alcohol, Ethyl (B): <11 mg/dL (ref 0–11)

## 2014-09-08 LAB — RAPID URINE DRUG SCREEN, HOSP PERFORMED
AMPHETAMINES: NOT DETECTED
BENZODIAZEPINES: POSITIVE — AB
Barbiturates: NOT DETECTED
Cocaine: NOT DETECTED
Opiates: NOT DETECTED
Tetrahydrocannabinol: NOT DETECTED

## 2014-09-08 MED ORDER — LORAZEPAM 0.5 MG PO TABS
0.5000 mg | ORAL_TABLET | Freq: Two times a day (BID) | ORAL | Status: DC
  Administered 2014-09-08 – 2014-09-09 (×2): 0.5 mg via ORAL
  Filled 2014-09-08 (×2): qty 1

## 2014-09-08 MED ORDER — IBUPROFEN 200 MG PO TABS: 600.0000 mg | ORAL_TABLET | Freq: Three times a day (TID) | ORAL | Status: DC | PRN

## 2014-09-08 MED ORDER — BUSPIRONE HCL 10 MG PO TABS
10.0000 mg | ORAL_TABLET | Freq: Two times a day (BID) | ORAL | Status: DC
  Administered 2014-09-08 – 2014-09-09 (×2): 10 mg via ORAL
  Filled 2014-09-08 (×2): qty 1

## 2014-09-08 MED ORDER — OLANZAPINE 10 MG PO TABS
10.0000 mg | ORAL_TABLET | Freq: Every day | ORAL | Status: DC
  Administered 2014-09-08: 10 mg via ORAL
  Filled 2014-09-08: qty 1

## 2014-09-08 MED ORDER — ALUM & MAG HYDROXIDE-SIMETH 200-200-20 MG/5ML PO SUSP: 30.0000 mL | ORAL | Status: DC | PRN

## 2014-09-08 MED ORDER — BUPROPION HCL ER (XL) 300 MG PO TB24
300.0000 mg | ORAL_TABLET | Freq: Every day | ORAL | Status: DC
  Administered 2014-09-08 – 2014-09-14 (×7): 300 mg via ORAL
  Filled 2014-09-08 (×8): qty 1

## 2014-09-08 MED ORDER — PAROXETINE HCL 20 MG PO TABS
40.0000 mg | ORAL_TABLET | Freq: Every day | ORAL | Status: DC
  Administered 2014-09-08 – 2014-09-13 (×6): 40 mg via ORAL
  Filled 2014-09-08 (×9): qty 2

## 2014-09-08 MED ORDER — ACETAMINOPHEN 325 MG PO TABS: 650.0000 mg | ORAL_TABLET | ORAL | Status: DC | PRN

## 2014-09-08 MED ORDER — ONDANSETRON HCL 4 MG PO TABS: 4.0000 mg | ORAL_TABLET | Freq: Three times a day (TID) | ORAL | Status: DC | PRN

## 2014-09-08 MED ORDER — TEMAZEPAM 15 MG PO CAPS
30.0000 mg | ORAL_CAPSULE | Freq: Every evening | ORAL | Status: DC | PRN
  Administered 2014-09-08 – 2014-09-11 (×2): 30 mg via ORAL
  Filled 2014-09-08 (×2): qty 2

## 2014-09-08 MED ORDER — RISPERIDONE 0.5 MG PO TABS
0.5000 mg | ORAL_TABLET | Freq: Every day | ORAL | Status: DC
  Administered 2014-09-08 – 2014-09-13 (×6): 0.5 mg via ORAL
  Filled 2014-09-08 (×6): qty 1

## 2014-09-08 NOTE — ED Provider Notes (Signed)
CSN: [id number]     Arrival date & time 09/08/14  2919 History   First MD Initiated Contact with Patient 09/08/14 1024     Chief Complaint  Patient presents with  . Suicidal  . Suicide Attempt    By drowning--[place]umped [place]     (Consider location/radiation/quality/duration/timing/severity/associated sxs/prior Treatment) The history is provided by the patient.   80 year old male with history of depression states that he tried to commit suicide today by [place]. He has been feeling suicidal for about the last 2 weeks and states that something happened but he does not want to talk about it. He has very mild early morning awakening and mild anhedonia but denies crying spells. He denies hallucinations. He has had to be admitted for major depression in the past.  Past Medical History  Diagnosis Date  . ALLERGIC RHINITIS 10/05/2007  . ANXIETY 10/05/2007  . COLONIC POLYPS, HX OF 10/05/2007  . DEPRESSION 10/04/2008  . PROSTATE CANCER, UNSPEC. 10/05/2007  . 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 Tonsillectomy    . Prostate surgery    . Nasal sinus surgery     Family History  Problem Relation Age of Onset  . Prostate cancer Father   . Breast cancer Sister    History  Substance Use Topics  . Smoking status: Former Smoker    Quit date: 10/13/1973  . Smokeless tobacco: Never Used  . Alcohol Use: 7.0 oz/week    14 drink(s) per week     Comment: glass of chardonnay each night    Review of Systems  All other systems reviewed and are negative.     Allergies  Review of patient's allergies indicates no known allergies.  Home Medications   Prior to Admission medications   Medication Sig Start Date End Date Taking? Authorizing Provider  buPROPion (WELLBUTRIN XL) 300 MG 24 hr tablet Take 300 mg by mouth daily.    Historical Provider, MD  busPIRone (BUSPAR) 10 MG tablet Take 10 mg by mouth 2 (two) times daily. 01/11/14   Benjamine Mola, FNP  LORazepam (ATIVAN) 0.5 MG tablet Take 0.5 mg by mouth 2 (two) times daily.    Historical Provider, MD  OLANZapine (ZYPREXA) 10 MG tablet Take 1 tablet (10 mg total) by mouth at bedtime. 01/11/14   Benjamine Mola, FNP  PARoxetine (PAXIL) 40 MG tablet Take 1 tablet (40 mg total) by mouth at bedtime. 01/11/14   Benjamine Mola, FNP  risperiDONE (RISPERDAL) 0.5 MG tablet Take 0.5 mg by mouth at bedtime.    Historical Provider, MD  temazepam (RESTORIL) 15 MG capsule Take 30 mg by mouth at bedtime as needed for sleep. 01/11/14   Elyse Jarvis Withrow, FNP   BP 161/88 mmHg  Pulse 84  Temp(Src) 97.9 F (36.6 C) (Oral)  Resp 20  SpO2 100% Physical Exam  Nursing note and vitals reviewed.  80 year old male, resting comfortably and in no acute distress. Vital signs are significant for hypertension. Oxygen saturation is 100%, which is normal. Head is normocephalic and atraumatic. PERRLA, EOMI. Oropharynx is clear. Neck is nontender and supple without adenopathy or JVD. Back is nontender and there is no CVA tenderness. Lungs are clear without rales, wheezes, or rhonchi. Chest is nontender. Heart has regular rate and rhythm without murmur. Abdomen is soft, flat, nontender without masses or hepatosplenomegaly and peristalsis is normoactive. Extremities have no cyanosis or edema, full range of motion is  present. Skin is warm and dry without rash. Neurologic: Mental status is normal, cranial nerves are intact, there are no motor or sensory deficits.  ED Course  Procedures (including critical care time) Labs Review Results for orders placed or performed during the hospital encounter of 09/08/14  Acetaminophen level  Result Value Ref Range   Acetaminophen (Tylenol), Serum <15.0 10 - 30 ug/mL  CBC  Result Value Ref Range   WBC 4.3 4.0 - 10.5 K/uL   RBC 4.57 4.22 - 5.81 MIL/uL   Hemoglobin 15.0 13.0 - 17.0 g/dL   HCT 43.7 39.0 - 52.0 %   MCV 95.6 78.0 - 100.0 fL   MCH 32.8 26.0 - 34.0 pg    MCHC 34.3 30.0 - 36.0 g/dL   RDW 12.8 11.5 - 15.5 %   Platelets 133 (L) 150 - 400 K/uL  Comprehensive metabolic panel  Result Value Ref Range   Sodium 136 (L) 137 - 147 mEq/L   Potassium 4.9 3.7 - 5.3 mEq/L   Chloride 97 96 - 112 mEq/L   CO2 24 19 - 32 mEq/L   Glucose, Bld 78 70 - 99 mg/dL   BUN 18 6 - 23 mg/dL   Creatinine, Ser 1.11 0.50 - 1.35 mg/dL   Calcium 9.3 8.4 - 10.5 mg/dL   Total Protein 6.9 6.0 - 8.3 g/dL   Albumin 4.0 3.5 - 5.2 g/dL   AST 19 0 - 37 U/L   ALT 19 0 - 53 U/L   Alkaline Phosphatase 100 39 - 117 U/L   Total Bilirubin 0.4 0.3 - 1.2 mg/dL   GFR calc non Af Amer 61 (L) >90 mL/min   GFR calc Af Amer 71 (L) >90 mL/min   Anion gap 15 5 - 15  Ethanol (ETOH)  Result Value Ref Range   Alcohol, Ethyl (B) <11 0 - 11 mg/dL  Salicylate level  Result Value Ref Range   Salicylate Lvl <5.0 (L) 2.8 - 20.0 mg/dL  Urine Drug Screen  Result Value Ref Range   Opiates NONE DETECTED NONE DETECTED   Cocaine NONE DETECTED NONE DETECTED   Benzodiazepines POSITIVE (A) NONE DETECTED   Amphetamines NONE DETECTED NONE DETECTED   Tetrahydrocannabinol NONE DETECTED NONE DETECTED   Barbiturates NONE DETECTED NONE DETECTED   MDM   Final diagnoses:  Major depressive disorder, recurrent, severe without psychotic features  Suicidal ideation  Unsuccessful suicide attempt    Major depression with suicidal ideation and suicide attempt. No evidence of actual injury from his suicide attempt. Screening labs are unremarkable. He will be moved to psychiatric holding and consultation obtained with TTS. Old records are reviewed confirming prior hospitalization for major depression.  Screening labs are significant for benzodiazepines and urine drug screen consistent with his known medication history, minimal hyponatremia and mild thrombocytopenia which are not of any clinical significance.  Delora Fuel, MD [id number]

## 2014-09-08 NOTE — BH Assessment (Signed)
Assessment Note  Gary Osborn is an 80 y.o. male. Patient presents voluntarily after suicide attempt by [place]. Pt states he was trying to kill himself d/t "being worried about money":. Pt then says, "I'd rather not go into it." Pt is a poor historian as he does not elaborate on answers and he is guarded. Pt is oriented x 4 and is polite. Pt denies current SI. Per chart review, pt was admitted to [facility] in March 2015 for OCD and anxiety d/o. Pt reports suicide attempt in Jan 2015 by trying to cut his throat and also trying to jump into traffic. Pt sts he goes to Dr Casimiro Needle for med management. Pt sts he was admitted inpatient for psychiatric issues at age 42 at a facility in [place]. Pt describes mood as "worried". He endorses isolating bx, loss of interest in usual pleasures, anger/irritability, worthlessness, guilt. Writer ran pt by Darleene Cleaver who accepts pt for geropsych placement. TTS will look for geropsych placement.   Axis I: Major Depressive Disorder, Recurrent, Severe            OCD Axis II: Deferred Axis III:  Past Medical History  Diagnosis Date  . ALLERGIC RHINITIS 10/05/2007  . ANXIETY 10/05/2007  . COLONIC POLYPS, HX OF 10/05/2007  . DEPRESSION 10/04/2008  . PROSTATE CANCER, UNSPEC. 10/05/2007  . SINUS BRADYCARDIA 10/04/2008   Axis IV: other psychosocial or environmental problems and problems related to social environment Axis V: 31-40 impairment in reality testing  Past Medical History:  Past Medical History  Diagnosis Date  . ALLERGIC RHINITIS 10/05/2007  . ANXIETY 10/05/2007  . COLONIC POLYPS, HX OF 10/05/2007  . DEPRESSION 10/04/2008  . PROSTATE CANCER, UNSPEC. 10/05/2007  . SINUS BRADYCARDIA 10/04/2008    Past Surgical History  Procedure Laterality Date  . Tonsillectomy    . Prostate surgery    . Nasal sinus surgery      Family History:  Family History  Problem Relation Age of Onset  . Prostate cancer Father   . Breast cancer Sister     Social  History:  reports that he quit smoking about 40 years ago. He has never used smokeless tobacco. He reports that he drinks about 7.0 oz of alcohol per week. He reports that he does not use illicit drugs.  Additional Social History:  Alcohol / Drug Use Pain Medications: see PTA meds list - pt denies abuse Prescriptions: see PTA meds list - pt denies abuse Over the Counter: see PTA meds list - pt denies abuse History of alcohol / drug use?: No history of alcohol / drug abuse  CIWA: CIWA-Ar BP: 161/88 mmHg Pulse Rate: 84 COWS:    Allergies: No Known Allergies  Home Medications:  (Not in a hospital admission)  OB/GYN Status:  No LMP for male patient.  General Assessment Data Location of Assessment: [facility] ED Is this a Tele or Face-to-Face Assessment?: Face-to-Face Is this an Initial Assessment or a Re-assessment for this encounter?: Initial Assessment Living Arrangements: Spouse/significant other (wife) Can pt return to current living arrangement?: Yes Admission Status: Voluntary Is patient capable of signing voluntary admission?: Yes Transfer from: Home Referral Source: Self/Family/Friend     [place] Living Arrangements: Spouse/significant other (wife) Name of Psychiatrist: Norma Fredrickson  Education Status Is patient currently in school?: No Highest grade of school patient has completed: 91 Name of school: [employer] to self with the past 6 months Suicidal Ideation: No Suicidal Intent: No Is patient  at risk for suicide?: Yes Suicidal Plan?: No Access to Means: Yes Specify Access to Suicidal Means: pt jumped in [place] prior to arrival What has been your use of drugs/alcohol within the last 12 months?: pt denies substance use Previous Attempts/Gestures: Yes How many times?: 3 (including today 09/08/14) Other Self Harm Risks: pt denies Triggers for Past Attempts:  (none reported) Intentional Self Injurious Behavior: None Family Suicide History:  Unknown Recent stressful life event(s): Financial Problems (pt refuses to elaborate) Persecutory voices/beliefs?: No Depression: Yes Depression Symptoms: Feeling worthless/self pity, Feeling angry/irritable, Loss of interest in usual pleasures, Guilt, Isolating, Despondent, Insomnia Substance abuse history and/or treatment for substance abuse?: No Suicide prevention information given to non-admitted patients: Not applicable  Risk to Others within the past 6 months Homicidal Ideation: No Thoughts of Harm to Others: No Current Homicidal Intent: No Current Homicidal Plan: No Access to Homicidal Means: No Identified Victim: none History of harm to others?: No Assessment of Violence: None Noted Violent Behavior Description: pt denies hx violence - is polite during assessment Does patient have access to weapons?: No Criminal Charges Pending?: No Does patient have a court date: No  Psychosis Hallucinations: None noted Delusions: None noted  Mental Status Report Appear/Hygiene: In hospital gown, Unremarkable Eye Contact: Good Motor Activity: Freedom of movement Speech: Logical/coherent Level of Consciousness: Alert, Quiet/awake Mood: Anxious, Other (Comment) ("worried" ) Affect: Appropriate to circumstance, Other (Comment), Blunted (guarded) Anxiety Level: None Thought Processes: Coherent, Relevant Judgement: Unimpaired Orientation: Place, Time, Person, Situation Obsessive Compulsive Thoughts/Behaviors: None  Cognitive Functioning Concentration: Normal Memory: Remote Intact, Recent Intact IQ: Average Insight: Fair Impulse Control: Poor Appetite: Good Sleep: No Change Total Hours of Sleep: 8 Vegetative Symptoms: None  ADLScreening [facility] Assessment Services) Patient's cognitive ability adequate to safely complete daily activities?: Yes Patient able to express need for assistance with ADLs?: Yes Independently performs ADLs?: Yes (appropriate for developmental age)  Prior  Inpatient Therapy Prior Inpatient Therapy: Yes Prior Therapy Dates: March 2015, Jan 2015, when pt was age 73 Prior Therapy Facilty/Provider(s): [facility], [place], facility in [facility] Reason for Treatment: depression, suicide attempts  Prior Outpatient Therapy Prior Outpatient Therapy: Yes Prior Therapy Dates: currently Prior Therapy Facilty/Provider(s): Dr Norma Fredrickson Reason for Treatment: OCD, anxiety D/O  ADL Screening (condition at time of admission) Patient's cognitive ability adequate to safely complete daily activities?: Yes Is the patient deaf or have difficulty hearing?: No Does the patient have difficulty seeing, even when wearing glasses/contacts?: No Does the patient have difficulty concentrating, remembering, or making decisions?: No Patient able to express need for assistance with ADLs?: Yes Does the patient have difficulty dressing or bathing?: No Independently performs ADLs?: Yes (appropriate for developmental age) Does the patient have difficulty walking or climbing stairs?: No Weakness of Legs: None Weakness of Arms/Hands: None  Home Assistive Devices/Equipment Home Assistive Devices/Equipment: Eyeglasses    Abuse/Neglect Assessment (Assessment to be complete while patient is alone) Physical Abuse: Denies Verbal Abuse: Denies Sexual Abuse: Denies Exploitation of patient/patient's resources: Denies Self-Neglect: Denies     Regulatory affairs officer (For Healthcare) Does patient have an advance directive?: No Would patient like information on creating an advanced directive?: No - patient declined information    Additional Information 1:1 In Past 12 Months?: No CIRT Risk: No Elopement Risk: No Does patient have medical clearance?: Yes     Disposition:  Disposition Initial Assessment Completed for this Encounter: Yes Disposition of Patient: Inpatient treatment program Type of inpatient treatment program: Adult Darleene Cleaver MD recommends geropsych  placement)  On Site Evaluation by:   Reviewed with Physician:    Leron Croak P 09/08/2014 11:36 AM

## 2014-09-08 NOTE — Consult Note (Signed)
[facility] Face-to-Face Psychiatry Consult   Reason for Consult:  Suicide attempt Referring Physician:  EDP  Gary Osborn is an 80 y.o. male. Total Time spent with patient: 45 minutes  Assessment: AXIS I:  Severe major depression without psychotic features; suicide attempt AXIS II:  Deferred AXIS III:   Past Medical History  Diagnosis Date  . ALLERGIC RHINITIS 10/05/2007  . ANXIETY 10/05/2007  . COLONIC POLYPS, HX OF 10/05/2007  . DEPRESSION 10/04/2008  . PROSTATE CANCER, UNSPEC. 10/05/2007  . 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  Subjective:   Gary Osborn is a 80 y.o. male patient admitted with depression and suicide attempt.  HPI:  Patient [place] today to try and kill himself.  When asked about his stressors or triggers, "I don't want to talk about it."  He did admit to an overdose/suicide attempt a few years ago and to depression.  Denies homicidal ideations, hallucinations, and alcohol/drug abuse.  He lives with his wife and is agreeable to to come inpatient for stabilization. HPI Elements:   Location:  generalized. Quality:  acute. Severity:  severe. Timing:  constant. Duration:  few days. Context:  stressors.  Past Psychiatric History: Past Medical History  Diagnosis Date  . ALLERGIC RHINITIS 10/05/2007  . ANXIETY 10/05/2007  . COLONIC POLYPS, HX OF 10/05/2007  . DEPRESSION 10/04/2008  . PROSTATE CANCER, UNSPEC. 10/05/2007  . 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 He has never used smokeless tobacco. He reports that he drinks about 7.0 oz of  alcohol per week. He reports that he does not use illicit drugs. Family History  Problem Relation Age of Onset  . Prostate cancer Father   . Breast cancer Sister    Family History Substance Abuse: No Family Supports: Yes, List: (wife) Living Arrangements: Spouse/significant other (wife) Can pt return to current living arrangement?: Yes Abuse/Neglect [facility]) Physical Abuse: Denies Verbal Abuse: Denies Sexual Abuse: Denies Allergies:  No Known Allergies  ACT Assessment Complete:  Yes:    Educational Status    Risk to Self: Risk to self with the past 6 months Suicidal Ideation: No Suicidal Intent: No Is patient at risk for suicide?: Yes Suicidal Plan?: No Access to Means: Yes Specify Access to Suicidal Means: pt jumped in [place] prior to arrival What has been your use of drugs/alcohol within the last 12 months?: pt denies substance use Previous Attempts/Gestures: Yes How many times?: 3 (including today 09/08/14) Other Self Harm Risks: pt denies Triggers for Past Attempts:  (none reported) Intentional Self Injurious Behavior: None Family Suicide History: Unknown Recent stressful life event(s): Financial Problems (pt refuses to elaborate) Persecutory voices/beliefs?: No Depression: Yes Depression Symptoms: Feeling worthless/self pity, Feeling angry/irritable, Loss of interest in usual pleasures, Guilt, Isolating, Despondent, Insomnia Substance abuse history and/or treatment for substance abuse?: No Suicide prevention information given to non-admitted patients: Not applicable  Risk to Others: Risk to Others within the past 6 months Homicidal Ideation: No Thoughts of Harm to Others: No Current Homicidal Intent: No Current Homicidal Plan: No Access to Homicidal Means: No Identified Victim: none History of harm to others?: No Assessment of Violence: None Noted  Violent Behavior Description: pt denies hx violence - is polite during assessment Does patient have access to weapons?:  No Criminal Charges Pending?: No Does patient have a court date: No  Abuse: Abuse/Neglect Assessment (Assessment to be complete while patient is alone) Physical Abuse: Denies Verbal Abuse: Denies Sexual Abuse: Denies Exploitation of patient/patient's resources: Denies Self-Neglect: Denies  Prior Inpatient Therapy: Prior Inpatient Therapy Prior Inpatient Therapy: Yes Prior Therapy Dates: March 2015, Jan 2015, when pt was age 80 Prior Therapy Facilty/Provider(s): [facility], [place], facility in [facility] Reason for Treatment: depression, suicide attempts  Prior Outpatient Therapy: Prior Outpatient Therapy Prior Outpatient Therapy: Yes Prior Therapy Dates: currently Prior Therapy Facilty/Provider(s): Dr Norma Fredrickson Reason for Treatment: OCD, anxiety D/O  Additional Information: Additional Information 1:1 In Past 12 Months?: No CIRT Risk: No Elopement Risk: No Does patient have medical clearance?: Yes                  Objective: Blood pressure 145/92, pulse 104, temperature 97.6 F (36.4 C), temperature source Oral, resp. rate 16, SpO2 97 %.There is no weight on file to calculate BMI. Results for orders placed or performed during the hospital encounter of 09/08/14 (from the past 72 hour(s))  Acetaminophen level     Status: None   Collection Time: 09/08/14  9:34 AM  Result Value Ref Range   Acetaminophen (Tylenol), Serum <15.0 10 - 30 ug/mL    Comment:        THERAPEUTIC CONCENTRATIONS VARY SIGNIFICANTLY. A RANGE OF 10-30 ug/mL MAY BE AN EFFECTIVE CONCENTRATION FOR MANY PATIENTS. HOWEVER, SOME ARE BEST TREATED AT CONCENTRATIONS OUTSIDE THIS RANGE. ACETAMINOPHEN CONCENTRATIONS >150 ug/mL AT 4 HOURS AFTER INGESTION AND >50 ug/mL AT 12 HOURS AFTER INGESTION ARE OFTEN ASSOCIATED WITH TOXIC REACTIONS.   CBC     Status: Abnormal   Collection Time: 09/08/14  9:34 AM  Result Value Ref Range   WBC 4.3 4.0 - 10.5 K/uL   RBC 4.57 4.22 - 5.81 MIL/uL   Hemoglobin 15.0  13.0 - 17.0 g/dL   HCT 43.7 39.0 - 52.0 %   MCV 95.6 78.0 - 100.0 fL   MCH 32.8 26.0 - 34.0 pg   MCHC 34.3 30.0 - 36.0 g/dL   RDW 12.8 11.5 - 15.5 %   Platelets 133 (L) 150 - 400 K/uL  Comprehensive metabolic panel     Status: Abnormal   Collection Time: 09/08/14  9:34 AM  Result Value Ref Range   Sodium 136 (L) 137 - 147 mEq/L   Potassium 4.9 3.7 - 5.3 mEq/L   Chloride 97 96 - 112 mEq/L   CO2 24 19 - 32 mEq/L   Glucose, Bld 78 70 - 99 mg/dL   BUN 18 6 - 23 mg/dL   Creatinine, Ser 1.11 0.50 - 1.35 mg/dL   Calcium 9.3 8.4 - 10.5 mg/dL   Total Protein 6.9 6.0 - 8.3 g/dL   Albumin 4.0 3.5 - 5.2 g/dL   AST 19 0 - 37 U/L   ALT 19 0 - 53 U/L   Alkaline Phosphatase 100 39 - 117 U/L   Total Bilirubin 0.4 0.3 - 1.2 mg/dL   GFR calc non Af Amer 61 (L) >90 mL/min   GFR calc Af Amer 71 (L) >90 mL/min    Comment: (NOTE) The eGFR has been calculated using the CKD EPI equation. This calculation has not been validated in all clinical situations. eGFR's persistently <90 mL/min signify possible Chronic Kidney Disease.    Anion gap 15  5 - 15  Ethanol (ETOH)     Status: None   Collection Time: 09/08/14  9:34 AM  Result Value Ref Range   Alcohol, Ethyl (B) <11 0 - 11 mg/dL    Comment:        LOWEST DETECTABLE LIMIT FOR SERUM ALCOHOL IS 11 mg/dL FOR MEDICAL PURPOSES ONLY   Salicylate level     Status: Abnormal   Collection Time: 09/08/14  9:34 AM  Result Value Ref Range   Salicylate Lvl <5.7 (L) 2.8 - 20.0 mg/dL  Urine Drug Screen     Status: Abnormal   Collection Time: 09/08/14 10:15 AM  Result Value Ref Range   Opiates NONE DETECTED NONE DETECTED   Cocaine NONE DETECTED NONE DETECTED   Benzodiazepines POSITIVE (A) NONE DETECTED   Amphetamines NONE DETECTED NONE DETECTED   Tetrahydrocannabinol NONE DETECTED NONE DETECTED   Barbiturates NONE DETECTED NONE DETECTED    Comment:        DRUG SCREEN FOR MEDICAL PURPOSES ONLY.  IF CONFIRMATION IS NEEDED FOR ANY PURPOSE, NOTIFY  LAB WITHIN 5 DAYS.        LOWEST DETECTABLE LIMITS FOR URINE DRUG SCREEN Drug Class       Cutoff (ng/mL) Amphetamine      1000 Barbiturate      200 Benzodiazepine   322 Tricyclics       025 Opiates          300 Cocaine          300 THC              50    Labs are reviewed and are pertinent for no medical issues.  Current Facility-Administered Medications  Medication Dose Route Frequency Provider Last Rate Last Dose  . acetaminophen (TYLENOL) tablet 650 mg  650 mg Oral K2H PRN Delora Fuel, MD      . alum & mag hydroxide-simeth (MAALOX/MYLANTA) 200-200-20 MG/5ML suspension 30 mL  30 mL Oral PRN Delora Fuel, MD      . buPROPion (WELLBUTRIN XL) 24 hr tablet 300 mg  300 mg Oral Daily Delora Fuel, MD   062 mg at 09/08/14 1517  . busPIRone (BUSPAR) tablet 10 mg  10 mg Oral BID Delora Fuel, MD      . ibuprofen (ADVIL,MOTRIN) tablet 600 mg  600 mg Oral B7S PRN Delora Fuel, MD      . LORazepam (ATIVAN) tablet 0.5 mg  0.5 mg Oral BID Delora Fuel, MD      . OLANZapine [facility]) tablet 10 mg  10 mg Oral QHS Delora Fuel, MD      . ondansetron [facility]) tablet 4 mg  4 mg Oral E8B PRN Delora Fuel, MD      . PARoxetine (PAXIL) tablet 40 mg  40 mg Oral QHS Delora Fuel, MD      . risperiDONE (RISPERDAL) tablet 0.5 mg  0.5 mg Oral QHS Delora Fuel, MD      . temazepam (RESTORIL) capsule 30 mg  30 mg Oral QHS PRN Delora Fuel, MD       Current Outpatient Prescriptions  Medication Sig Dispense Refill  . buPROPion (WELLBUTRIN XL) 300 MG 24 hr tablet Take 300 mg by mouth daily.    . busPIRone (BUSPAR) 10 MG tablet Take 20 mg by mouth 2 (two) times daily.     Marland Kitchen LORazepam (ATIVAN) 0.5 MG tablet Take 0.5 mg by mouth 2 (two) times daily.    Marland Kitchen PARoxetine (PAXIL) 40 MG tablet Take 1 tablet (  40 mg total) by mouth at bedtime. 30 tablet 0  . risperiDONE (RISPERDAL) 0.5 MG tablet Take 1 mg by mouth at bedtime.     . temazepam (RESTORIL) 15 MG capsule Take 30 mg by mouth at bedtime.       Psychiatric Specialty  Exam:     Blood pressure 145/92, pulse 104, temperature 97.6 F (36.4 C), temperature source Oral, resp. rate 16, SpO2 97 %.There is no weight on file to calculate BMI.  General Appearance: Casual  Eye Contact::  Good  Speech:  Normal Rate  Volume:  Normal  Mood:  Depressed  Affect:  Congruent  Thought Process:  Coherent  Orientation:  Full (Time, Place, and Person)  Thought Content:  WDL  Suicidal Thoughts:  Yes.  with intent/plan  Homicidal Thoughts:  No  Memory:  Immediate;   Good Recent;   Good Remote;   Good  Judgement:  Poor  Insight:  Fair  Psychomotor Activity:  Decreased  Concentration:  Fair  Recall:  [employer] of Knowledge:Fair  Language: Fair  Akathisia:  No  Handed:  Right  AIMS (if indicated):     Assets:  Housing Leisure Time Physical Health Resilience Social Support  Sleep:      Musculoskeletal: Strength & Muscle Tone: within normal limits Gait & Station: normal Patient leans: N/A  Treatment Plan Summary: Daily contact with patient to assess and evaluate symptoms and progress in treatment Medication management; admit to inpatient psychiatric unit for stabilization  Waylan Boga, PMH-NP 09/08/2014 4:53 PM  Patient seen, evaluated and I agree with notes by Nurse Practitioner. Corena Pilgrim, MD

## 2014-09-08 NOTE — ED Notes (Signed)
Report from ED to transfer to room 41. Request to administer 10:45 medications.

## 2014-09-08 NOTE — ED Notes (Signed)
Psych NP at bedside

## 2014-09-08 NOTE — BH Assessment (Signed)
Waylan Boga, NP recommends inpatient geriatric-psychiatry placement. Contacted the following facilities:  BED AVAILABLE, FAXED CLINICAL INFORMATION: [facility], per Kennon Holter, per [facility], per Daleen Snook  INFORMATION RECEIVED, PT UNDER REVIEW: [facility], per Daleen Snook  AT CAPACITY: [facility], per [facility], per [facility] [place], per [facility], per Opal Sidles  NO RESPONSE: [facility]   [place], [place], [facility] Triage Specialist [phone]

## 2014-09-08 NOTE — ED Notes (Signed)
Belongings given to behavioral ED

## 2014-09-08 NOTE — ED Notes (Signed)
pts belongings in locker 30

## 2014-09-08 NOTE — ED Notes (Signed)
Patient reports previous suicide attempts via pills Patient states that trigger for today was "A lot problems"--patient will not elaborate and is not forthcoming with information

## 2014-09-08 NOTE — ED Notes (Signed)
Patient brought in by [facility] due to [place] Per EMS, "Bystanders called [place]." Bystanders [place] Patient with hx of depression and anxiety Patient arrives alert and oriented x 4

## 2014-09-08 NOTE — ED Notes (Signed)
Bed: PP89 Expected date:  Expected time:  Means of arrival:  Comments: 80 y/o M, [place], suicidal

## 2014-09-08 NOTE — ED Notes (Signed)
Pt given urinal and made aware of need for urine specimen 

## 2014-09-08 NOTE — ED Notes (Signed)
Patient and patient's belongings wanded by security

## 2014-09-08 NOTE — ED Notes (Signed)
Dr. Glick at bedside.  

## 2014-09-08 NOTE — ED Notes (Signed)
Patient belongings include: Jacket--tan/brown  Shoes Socks Pants--khaki  [place] and shirt were cut off my EMS prior to arrival in ED Clothing is soaking wet due [place]

## 2014-09-08 NOTE — ED Notes (Addendum)
Labs drawn and sent Extra blankets provided to patient Patient informed of need for urine specimen--urinal at bedside Patient denies c/o pain or further needs at this time Side rails up, door to room left open

## 2014-09-09 DIAGNOSIS — F332 Major depressive disorder, recurrent severe without psychotic features: Secondary | ICD-10-CM | POA: Diagnosis not present

## 2014-09-09 DIAGNOSIS — R45851 Suicidal ideations: Secondary | ICD-10-CM | POA: Diagnosis not present

## 2014-09-09 MED ORDER — BUSPIRONE HCL 10 MG PO TABS
10.0000 mg | ORAL_TABLET | Freq: Three times a day (TID) | ORAL | Status: DC
  Administered 2014-09-09 – 2014-09-14 (×15): 10 mg via ORAL
  Filled 2014-09-09 (×15): qty 1

## 2014-09-09 MED ORDER — CLONAZEPAM 0.5 MG PO TABS
0.5000 mg | ORAL_TABLET | Freq: Two times a day (BID) | ORAL | Status: DC
  Administered 2014-09-09 – 2014-09-14 (×10): 0.5 mg via ORAL
  Filled 2014-09-09 (×10): qty 1

## 2014-09-09 NOTE — Progress Notes (Addendum)
Patient was referred to:  Pending Refaxed: [place]: Clide Deutscher denied due to no bed availability due to unit acuity.   Will continue to seek placement.  Olegario Shearer Brittinee Risk,LCSW [phone]

## 2014-09-09 NOTE — Consult Note (Signed)
[facility] Face-to-Face Psychiatry Consult   Reason for Consult:  Suicide attempt Referring Physician:  EDP  Gary Osborn is an 80 y.o. male. Total Time spent with patient: 30 minutes  Assessment: AXIS I:  Severe major depression without psychotic features; suicide attempt AXIS II:  Deferred AXIS III:   Past Medical History  Diagnosis Date  . ALLERGIC RHINITIS 10/05/2007  . ANXIETY 10/05/2007  . COLONIC POLYPS, HX OF 10/05/2007  . DEPRESSION 10/04/2008  . PROSTATE CANCER, UNSPEC. 10/05/2007  . 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  Gero-psychiatry  Subjective:   Gary Osborn is a 80 y.o. male patient admitted with depression and suicide attempt.  HPI:  Patient remains depressed but does discuss his suicide attempt yesterday.  [place] then "dragged" himself out and was walking down the road wet when the manager at [employer] recognized him and gave him some coffee, brought to the ED.  He said his son lost his job in [place] but is going to [place] and [place], already paid for, and he hopes he has not messed things up for him.  His biggest stressor is financial issues which he still does not want to discuss.  He does stated his wife is angry with him and she hung the phone up yesterday on him and he has not spoken to her since.   HPI Elements:   Location:  generalized. Quality:  acute. Severity:  severe. Timing:  constant. Duration:  few days. Context:  stressors.  Past Psychiatric History: Past Medical History  Diagnosis Date  . ALLERGIC RHINITIS 10/05/2007  . ANXIETY 10/05/2007   . COLONIC POLYPS, HX OF 10/05/2007  . DEPRESSION 10/04/2008  . PROSTATE CANCER, UNSPEC. 10/05/2007  . 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 He has never used smokeless tobacco. He reports that he drinks about 7.0 oz of alcohol per week. He reports that he does not use illicit drugs. Family History  Problem Relation Age of Onset  . Prostate cancer Father   . Breast cancer Sister    Family History Substance Abuse: No Family Supports: Yes, List: (wife) Living Arrangements: Spouse/significant other (wife) Can pt return to current living arrangement?: Yes Abuse/Neglect [employer]) Physical Abuse: Denies Verbal Abuse: Denies Sexual Abuse: Denies Allergies:  No Known Allergies  ACT Assessment Complete:  Yes:    Educational Status    Risk to Self: Risk to self with the past 6 months Suicidal Ideation: No Suicidal Intent: No Is patient at risk for suicide?: Yes Suicidal Plan?: No Access to Means: Yes Specify Access to Suicidal Means: pt jumped in [place] prior to arrival What has been your use of drugs/alcohol within the last 12 months?: pt denies substance use Previous Attempts/Gestures: Yes How many times?: 3 (including today 09/08/14) Other Self Harm Risks: pt denies Triggers for Past Attempts:  (none reported) Intentional Self Injurious Behavior: None Family Suicide History: Unknown Recent stressful life event(s): Financial Problems (pt refuses to elaborate) Persecutory voices/beliefs?: No Depression: Yes Depression Symptoms: Feeling worthless/self pity, Feeling angry/irritable, Loss of interest in usual pleasures, Guilt, Isolating, Despondent, Insomnia Substance abuse history and/or treatment for  substance abuse?: No Suicide prevention information given to non-admitted patients: Not applicable  Risk to Others: Risk to Others within the past 6 months Homicidal Ideation: No Thoughts of Harm to Others: No Current Homicidal Intent:  No Current Homicidal Plan: No Access to Homicidal Means: No Identified Victim: none History of harm to others?: No Assessment of Violence: None Noted Violent Behavior Description: pt denies hx violence - is polite during assessment Does patient have access to weapons?: No Criminal Charges Pending?: No Does patient have a court date: No  Abuse: Abuse/Neglect Assessment (Assessment to be complete while patient is alone) Physical Abuse: Denies Verbal Abuse: Denies Sexual Abuse: Denies Exploitation of patient/patient's resources: Denies Self-Neglect: Denies  Prior Inpatient Therapy: Prior Inpatient Therapy Prior Inpatient Therapy: Yes Prior Therapy Dates: March 2015, Jan 2015, when pt was age 17 Prior Therapy Facilty/Provider(s): [facility], [place], facility in [facility] Reason for Treatment: depression, suicide attempts  Prior Outpatient Therapy: Prior Outpatient Therapy Prior Outpatient Therapy: Yes Prior Therapy Dates: currently Prior Therapy Facilty/Provider(s): Dr Norma Fredrickson Reason for Treatment: OCD, anxiety D/O  Additional Information: Additional Information 1:1 In Past 12 Months?: No CIRT Risk: No Elopement Risk: No Does patient have medical clearance?: Yes                  Objective: Blood pressure 129/73, pulse 79, temperature 98.7 F (37.1 C), temperature source Oral, resp. rate 16, SpO2 99 %.There is no weight on file to calculate BMI. Results for orders placed or performed during the hospital encounter of 09/08/14 (from the past 72 hour(s))  Acetaminophen level     Status: None   Collection Time: 09/08/14  9:34 AM  Result Value Ref Range   Acetaminophen (Tylenol), Serum <15.0 10 - 30 ug/mL    Comment:        THERAPEUTIC CONCENTRATIONS VARY SIGNIFICANTLY. A RANGE OF 10-30 ug/mL MAY BE AN EFFECTIVE CONCENTRATION FOR MANY PATIENTS. HOWEVER, SOME ARE BEST TREATED AT CONCENTRATIONS OUTSIDE THIS RANGE. ACETAMINOPHEN CONCENTRATIONS >150 ug/mL AT 4  HOURS AFTER INGESTION AND >50 ug/mL AT 12 HOURS AFTER INGESTION ARE OFTEN ASSOCIATED WITH TOXIC REACTIONS.   CBC     Status: Abnormal   Collection Time: 09/08/14  9:34 AM  Result Value Ref Range   WBC 4.3 4.0 - 10.5 K/uL   RBC 4.57 4.22 - 5.81 MIL/uL   Hemoglobin 15.0 13.0 - 17.0 g/dL   HCT 43.7 39.0 - 52.0 %   MCV 95.6 78.0 - 100.0 fL   MCH 32.8 26.0 - 34.0 pg   MCHC 34.3 30.0 - 36.0 g/dL   RDW 12.8 11.5 - 15.5 %   Platelets 133 (L) 150 - 400 K/uL  Comprehensive metabolic panel     Status: Abnormal   Collection Time: 09/08/14  9:34 AM  Result Value Ref Range   Sodium 136 (L) 137 - 147 mEq/L   Potassium 4.9 3.7 - 5.3 mEq/L   Chloride 97 96 - 112 mEq/L   CO2 24 19 - 32 mEq/L   Glucose, Bld 78 70 - 99 mg/dL   BUN 18 6 - 23 mg/dL   Creatinine, Ser 1.11 0.50 - 1.35 mg/dL   Calcium 9.3 8.4 - 10.5 mg/dL   Total Protein 6.9 6.0 - 8.3 g/dL   Albumin 4.0 3.5 - 5.2 g/dL   AST 19 0 - 37 U/L   ALT 19 0 - 53 U/L   Alkaline Phosphatase 100 39 - 117 U/L   Total Bilirubin 0.4 0.3 - 1.2 mg/dL  GFR calc non Af Amer 61 (L) >90 mL/min   GFR calc Af Amer 71 (L) >90 mL/min    Comment: (NOTE) The eGFR has been calculated using the CKD EPI equation. This calculation has not been validated in all clinical situations. eGFR's persistently <90 mL/min signify possible Chronic Kidney Disease.    Anion gap 15 5 - 15  Ethanol (ETOH)     Status: None   Collection Time: 09/08/14  9:34 AM  Result Value Ref Range   Alcohol, Ethyl (B) <11 0 - 11 mg/dL    Comment:        LOWEST DETECTABLE LIMIT FOR SERUM ALCOHOL IS 11 mg/dL FOR MEDICAL PURPOSES ONLY   Salicylate level     Status: Abnormal   Collection Time: 09/08/14  9:34 AM  Result Value Ref Range   Salicylate Lvl <2.0 (L) 2.8 - 20.0 mg/dL  Urine Drug Screen     Status: Abnormal   Collection Time: 09/08/14 10:15 AM  Result Value Ref Range   Opiates NONE DETECTED NONE DETECTED   Cocaine NONE DETECTED NONE DETECTED   Benzodiazepines POSITIVE  (A) NONE DETECTED   Amphetamines NONE DETECTED NONE DETECTED   Tetrahydrocannabinol NONE DETECTED NONE DETECTED   Barbiturates NONE DETECTED NONE DETECTED    Comment:        DRUG SCREEN FOR MEDICAL PURPOSES ONLY.  IF CONFIRMATION IS NEEDED FOR ANY PURPOSE, NOTIFY LAB WITHIN 5 DAYS.        LOWEST DETECTABLE LIMITS FOR URINE DRUG SCREEN Drug Class       Cutoff (ng/mL) Amphetamine      1000 Barbiturate      200 Benzodiazepine   200 Tricyclics       300 Opiates          300 Cocaine          300 THC              50    Labs are reviewed and are pertinent for no medical issues.  Current Facility-Administered Medications  Medication Dose Route Frequency Provider Last Rate Last Dose  . acetaminophen (TYLENOL) tablet 650 mg  650 mg Oral Q4H PRN Dione Booze, MD      . alum & mag hydroxide-simeth (MAALOX/MYLANTA) 200-200-20 MG/5ML suspension 30 mL  30 mL Oral PRN Dione Booze, MD      . buPROPion (WELLBUTRIN XL) 24 hr tablet 300 mg  300 mg Oral Daily Dione Booze, MD   300 mg at 09/09/14 0753  . busPIRone (BUSPAR) tablet 10 mg  10 mg Oral BID Dione Booze, MD   10 mg at 09/09/14 0754  . ibuprofen (ADVIL,MOTRIN) tablet 600 mg  600 mg Oral Q8H PRN Dione Booze, MD      . LORazepam (ATIVAN) tablet 0.5 mg  0.5 mg Oral BID Dione Booze, MD   0.5 mg at 09/09/14 0753  . OLANZapine (ZYPREXA) tablet 10 mg  10 mg Oral QHS Dione Booze, MD   10 mg at 09/08/14 2104  . ondansetron (ZOFRAN) tablet 4 mg  4 mg Oral Q8H PRN Dione Booze, MD      . PARoxetine (PAXIL) tablet 40 mg  40 mg Oral QHS Dione Booze, MD   40 mg at 09/08/14 2108  . risperiDONE (RISPERDAL) tablet 0.5 mg  0.5 mg Oral QHS Dione Booze, MD   0.5 mg at 09/08/14 2104  . temazepam (RESTORIL) capsule 30 mg  30 mg Oral QHS PRN Dione Booze, MD   30 mg at  09/08/14 2104   Current Outpatient Prescriptions  Medication Sig Dispense Refill  . buPROPion (WELLBUTRIN XL) 300 MG 24 hr tablet Take 300 mg by mouth daily.    . busPIRone (BUSPAR) 10 MG tablet  Take 20 mg by mouth 2 (two) times daily.     Marland Kitchen LORazepam (ATIVAN) 0.5 MG tablet Take 0.5 mg by mouth 2 (two) times daily.    Marland Kitchen PARoxetine (PAXIL) 40 MG tablet Take 1 tablet (40 mg total) by mouth at bedtime. 30 tablet 0  . risperiDONE (RISPERDAL) 0.5 MG tablet Take 1 mg by mouth at bedtime.     . temazepam (RESTORIL) 15 MG capsule Take 30 mg by mouth at bedtime.       Psychiatric Specialty Exam:     Blood pressure 129/73, pulse 79, temperature 98.7 F (37.1 C), temperature source Oral, resp. rate 16, SpO2 99 %.There is no weight on file to calculate BMI.  General Appearance: Casual  Eye Contact::  Good  Speech:  Normal Rate  Volume:  Normal  Mood:  Depressed  Affect:  Congruent  Thought Process:  Coherent  Orientation:  Full (Time, Place, and Person)  Thought Content:  WDL  Suicidal Thoughts:  Yes.  with intent/plan  Homicidal Thoughts:  No  Memory:  Immediate;   Good Recent;   Good Remote;   Good  Judgement:  Poor  Insight:  Fair  Psychomotor Activity:  Decreased  Concentration:  Fair  Recall:  [employer] of Knowledge:Fair  Language: Fair  Akathisia:  No  Handed:  Right  AIMS (if indicated):     Assets:  Housing Leisure Time Physical Health Resilience Social Support  Sleep:      Musculoskeletal: Strength & Muscle Tone: within normal limits Gait & Station: normal Patient leans: N/A  Treatment Plan Summary: Daily contact with patient to assess and evaluate symptoms and progress in treatment Medication management; admit to inpatient psychiatric unit for stabilization  Waylan Boga, PMH-NP 09/09/2014 5:51 PM  Patient seen, evaluated and I agree with notes by Nurse Practitioner. Corena Pilgrim, MD

## 2014-09-09 NOTE — ED Notes (Addendum)
Nursing shift note: pt has been in his room most of the am. He was able to come and ask the RN for a new and clean undergarment due to some incontinence. He presently denies any si/hi/av but also did not want to take about his recent suicide attempt. He had no complaints of pain and has not voiced any issues. Staff will  monitor and Q 15 min ck's continue.

## 2014-09-09 NOTE — ED Notes (Signed)
Report received from [facility]. Pt. Sleeping, respirations regular and unlabored.  Will continue to monitor for safety. Q 15 minute checks continue.

## 2014-09-10 ENCOUNTER — Encounter (HOSPITAL_COMMUNITY): Payer: Self-pay | Admitting: Registered Nurse

## 2014-09-10 ENCOUNTER — Emergency Department (HOSPITAL_COMMUNITY): Payer: Medicare Other

## 2014-09-10 DIAGNOSIS — F332 Major depressive disorder, recurrent severe without psychotic features: Secondary | ICD-10-CM | POA: Diagnosis not present

## 2014-09-10 DIAGNOSIS — R45851 Suicidal ideations: Secondary | ICD-10-CM | POA: Diagnosis not present

## 2014-09-10 NOTE — Consult Note (Signed)
[facility] Face-to-Face Psychiatry Consult   Reason for Consult:  Suicide attempt Referring Physician:  EDP  Gary Osborn is an 80 y.o. male. Total Time spent with patient: 30 minutes  Assessment: AXIS I:  Severe major depression without psychotic features; suicide attempt AXIS II:  Deferred AXIS III:   Past Medical History  Diagnosis Date  . ALLERGIC RHINITIS 10/05/2007  . ANXIETY 10/05/2007  . COLONIC POLYPS, HX OF 10/05/2007  . DEPRESSION 10/04/2008  . PROSTATE CANCER, UNSPEC. 10/05/2007  . 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  Gero-psychiatry  Subjective:   Gary Osborn is a 80 y.o. male patient admitted with depression and suicide attempt.  HPI:  Patient remains depressed but does discuss his suicide attempt yesterday.  [place] then "dragged" himself out and was walking down the road wet when the manager at [employer] recognized him and gave him some coffee, brought to the ED.  He said his son lost his job in [place] but is going to [place] and [place], already paid for, and he hopes he has not messed things up for him.  His biggest stressor is financial issues which he still does not want to discuss.  He does stated his wife is angry with him and she hung the phone up yesterday on him and he has not spoken to her since.     Today:  Patient continues to have suicidal ideation.  Patient stats that he wife is still upset and agrees with the plan for inpatient treatment.  Patient states this isn't my first suicide attempt.    HPI Elements:   Location:  generalized. Quality:   acute. Severity:  severe. Timing:  constant. Duration:  few days. Context:  stressors.  Past Psychiatric History: Past Medical History  Diagnosis Date  . ALLERGIC RHINITIS 10/05/2007  . ANXIETY 10/05/2007  . COLONIC POLYPS, HX OF 10/05/2007  . DEPRESSION 10/04/2008  . PROSTATE CANCER, UNSPEC. 10/05/2007  . 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 He has never used smokeless tobacco. He reports that he drinks about 7.0 oz of alcohol per week. He reports that he does not use illicit drugs. Family History  Problem Relation Age of Onset  . Prostate cancer Father   . Breast cancer Sister    Family History Substance Abuse: No Family Supports: Yes, List: (wife) Living Arrangements: Spouse/significant other (wife) Can pt return to current living arrangement?: Yes Abuse/Neglect [name]) Physical Abuse: Denies Verbal Abuse: Denies Sexual Abuse: Denies Allergies:  No Known Allergies  ACT Assessment Complete:  Yes:    Educational Status    Risk to Self: Risk to self with the past 6 months Suicidal Ideation: No Suicidal Intent: No Is patient at risk for suicide?: Yes Suicidal Plan?: No Access to Means: Yes Specify Access to Suicidal Means: pt jumped in [place] prior to arrival What has been your use of drugs/alcohol within the last 12 months?: pt denies substance use Previous Attempts/Gestures: Yes How many times?: 3 (including today 09/08/14) Other Self Harm Risks: pt denies Triggers for Past Attempts:  (none reported) Intentional Self Injurious Behavior: None Family Suicide  History: Unknown Recent stressful life event(s): Financial Problems (pt refuses to elaborate) Persecutory voices/beliefs?: No Depression: Yes Depression Symptoms: Feeling worthless/self pity, Feeling angry/irritable, Loss of interest in usual pleasures, Guilt, Isolating, Despondent, Insomnia Substance abuse history and/or treatment for substance abuse?: No Suicide  prevention information given to non-admitted patients: Not applicable  Risk to Others: Risk to Others within the past 6 months Homicidal Ideation: No Thoughts of Harm to Others: No Current Homicidal Intent: No Current Homicidal Plan: No Access to Homicidal Means: No Identified Victim: none History of harm to others?: No Assessment of Violence: None Noted Violent Behavior Description: pt denies hx violence - is polite during assessment Does patient have access to weapons?: No Criminal Charges Pending?: No Does patient have a court date: No  Abuse: Abuse/Neglect Assessment (Assessment to be complete while patient is alone) Physical Abuse: Denies Verbal Abuse: Denies Sexual Abuse: Denies Exploitation of patient/patient's resources: Denies Self-Neglect: Denies  Prior Inpatient Therapy: Prior Inpatient Therapy Prior Inpatient Therapy: Yes Prior Therapy Dates: March 2015, Jan 2015, when pt was age 5 Prior Therapy Facilty/Provider(s): [facility], [place], facility in [facility] Reason for Treatment: depression, suicide attempts  Prior Outpatient Therapy: Prior Outpatient Therapy Prior Outpatient Therapy: Yes Prior Therapy Dates: currently Prior Therapy Facilty/Provider(s): Dr Norma Fredrickson Reason for Treatment: OCD, anxiety D/O  Additional Information: Additional Information 1:1 In Past 12 Months?: No CIRT Risk: No Elopement Risk: No Does patient have medical clearance?: Yes                  Objective: Blood pressure 136/82, pulse 92, temperature 98.7 F (37.1 C), temperature source Oral, resp. rate 18, SpO2 100 %.There is no weight on file to calculate BMI. Results for orders placed or performed during the hospital encounter of 09/08/14 (from the past 72 hour(s))  Acetaminophen level     Status: None   Collection Time: 09/08/14  9:34 AM  Result Value Ref Range   Acetaminophen (Tylenol), Serum <15.0 10 - 30 ug/mL    Comment:        THERAPEUTIC CONCENTRATIONS  VARY SIGNIFICANTLY. A RANGE OF 10-30 ug/mL MAY BE AN EFFECTIVE CONCENTRATION FOR MANY PATIENTS. HOWEVER, SOME ARE BEST TREATED AT CONCENTRATIONS OUTSIDE THIS RANGE. ACETAMINOPHEN CONCENTRATIONS >150 ug/mL AT 4 HOURS AFTER INGESTION AND >50 ug/mL AT 12 HOURS AFTER INGESTION ARE OFTEN ASSOCIATED WITH TOXIC REACTIONS.   CBC     Status: Abnormal   Collection Time: 09/08/14  9:34 AM  Result Value Ref Range   WBC 4.3 4.0 - 10.5 K/uL   RBC 4.57 4.22 - 5.81 MIL/uL   Hemoglobin 15.0 13.0 - 17.0 g/dL   HCT 43.7 39.0 - 52.0 %   MCV 95.6 78.0 - 100.0 fL   MCH 32.8 26.0 - 34.0 pg   MCHC 34.3 30.0 - 36.0 g/dL   RDW 12.8 11.5 - 15.5 %   Platelets 133 (L) 150 - 400 K/uL  Comprehensive metabolic panel     Status: Abnormal   Collection Time: 09/08/14  9:34 AM  Result Value Ref Range   Sodium 136 (L) 137 - 147 mEq/L   Potassium 4.9 3.7 - 5.3 mEq/L   Chloride 97 96 - 112 mEq/L   CO2 24 19 - 32 mEq/L   Glucose, Bld 78 70 - 99 mg/dL   BUN 18 6 - 23 mg/dL   Creatinine, Ser 1.11 0.50 - 1.35 mg/dL   Calcium 9.3 8.4 - 10.5 mg/dL   Total Protein 6.9 6.0 - 8.3 g/dL   Albumin  4.0 3.5 - 5.2 g/dL   AST 19 0 - 37 U/L   ALT 19 0 - 53 U/L   Alkaline Phosphatase 100 39 - 117 U/L   Total Bilirubin 0.4 0.3 - 1.2 mg/dL   GFR calc non Af Amer 61 (L) >90 mL/min   GFR calc Af Amer 71 (L) >90 mL/min    Comment: (NOTE) The eGFR has been calculated using the CKD EPI equation. This calculation has not been validated in all clinical situations. eGFR's persistently <90 mL/min signify possible Chronic Kidney Disease.    Anion gap 15 5 - 15  Ethanol (ETOH)     Status: None   Collection Time: 09/08/14  9:34 AM  Result Value Ref Range   Alcohol, Ethyl (B) <11 0 - 11 mg/dL    Comment:        LOWEST DETECTABLE LIMIT FOR SERUM ALCOHOL IS 11 mg/dL FOR MEDICAL PURPOSES ONLY   Salicylate level     Status: Abnormal   Collection Time: 09/08/14  9:34 AM  Result Value Ref Range   Salicylate Lvl <6.7 (L) 2.8 -  20.0 mg/dL  Urine Drug Screen     Status: Abnormal   Collection Time: 09/08/14 10:15 AM  Result Value Ref Range   Opiates NONE DETECTED NONE DETECTED   Cocaine NONE DETECTED NONE DETECTED   Benzodiazepines POSITIVE (A) NONE DETECTED   Amphetamines NONE DETECTED NONE DETECTED   Tetrahydrocannabinol NONE DETECTED NONE DETECTED   Barbiturates NONE DETECTED NONE DETECTED    Comment:        DRUG SCREEN FOR MEDICAL PURPOSES ONLY.  IF CONFIRMATION IS NEEDED FOR ANY PURPOSE, NOTIFY LAB WITHIN 5 DAYS.        LOWEST DETECTABLE LIMITS FOR URINE DRUG SCREEN Drug Class       Cutoff (ng/mL) Amphetamine      1000 Barbiturate      200 Benzodiazepine   209 Tricyclics       470 Opiates          300 Cocaine          300 THC              50    Labs are reviewed and are pertinent for no medical issues.  Current Facility-Administered Medications  Medication Dose Route Frequency Provider Last Rate Last Dose  . alum & mag hydroxide-simeth (MAALOX/MYLANTA) 200-200-20 MG/5ML suspension 30 mL  30 mL Oral PRN Delora Fuel, MD      . buPROPion (WELLBUTRIN XL) 24 hr tablet 300 mg  300 mg Oral Daily Delora Fuel, MD   962 mg at 09/10/14 1107  . busPIRone (BUSPAR) tablet 10 mg  10 mg Oral TID Waylan Boga, NP   10 mg at 09/10/14 0727  . clonazePAM (KLONOPIN) tablet 0.5 mg  0.5 mg Oral BID Waylan Boga, NP   0.5 mg at 09/10/14 0727  . ibuprofen (ADVIL,MOTRIN) tablet 600 mg  600 mg Oral E3M PRN Delora Fuel, MD      . ondansetron [facility]) tablet 4 mg  4 mg Oral O2H PRN Delora Fuel, MD      . PARoxetine (PAXIL) tablet 40 mg  40 mg Oral QHS Delora Fuel, MD   40 mg at 47/65/46 2135  . risperiDONE (RISPERDAL) tablet 0.5 mg  0.5 mg Oral QHS Delora Fuel, MD   0.5 mg at 09/09/14 2118  . temazepam (RESTORIL) capsule 30 mg  30 mg Oral QHS PRN Delora Fuel, MD   30 mg at 50/35/46  2104   Current Outpatient Prescriptions  Medication Sig Dispense Refill  . buPROPion (WELLBUTRIN XL) 300 MG 24 hr tablet Take 300 mg by mouth  daily.    . busPIRone (BUSPAR) 10 MG tablet Take 20 mg by mouth 2 (two) times daily.     Marland Kitchen LORazepam (ATIVAN) 0.5 MG tablet Take 0.5 mg by mouth 2 (two) times daily.    Marland Kitchen PARoxetine (PAXIL) 40 MG tablet Take 1 tablet (40 mg total) by mouth at bedtime. 30 tablet 0  . risperiDONE (RISPERDAL) 0.5 MG tablet Take 1 mg by mouth at bedtime.     . temazepam (RESTORIL) 15 MG capsule Take 30 mg by mouth at bedtime.       Psychiatric Specialty Exam:     Blood pressure 136/82, pulse 92, temperature 98.7 F (37.1 C), temperature source Oral, resp. rate 18, SpO2 100 %.There is no weight on file to calculate BMI.  General Appearance: Casual  Eye Contact::  Good  Speech:  Normal Rate  Volume:  Normal  Mood:  Depressed  Affect:  Congruent  Thought Process:  Coherent  Orientation:  Full (Time, Place, and Person)  Thought Content:  WDL  Suicidal Thoughts:  Yes.  with intent/plan  Homicidal Thoughts:  No  Memory:  Immediate;   Good Recent;   Good Remote;   Good  Judgement:  Poor  Insight:  Fair  Psychomotor Activity:  Decreased  Concentration:  Fair  Recall:  [employer] of Knowledge:Fair  Language: Fair  Akathisia:  No  Handed:  Right  AIMS (if indicated):     Assets:  Housing Leisure Time Physical Health Resilience Social Support  Sleep:      Musculoskeletal: Strength & Muscle Tone: within normal limits Gait & Station: normal Patient leans: N/A  Treatment Plan Summary: Daily contact with patient to assess and evaluate symptoms and progress in treatment Medication management; admit to inpatient psychiatric unit for stabilization  Continue with current treatment plan [facility]).    Earleen Newport, FNP-BC  09/10/2014 3:29 PM   Patient seen, evaluated and I agree with notes by Nurse Practitioner. Corena Pilgrim, MD

## 2014-09-10 NOTE — B[facility] Counselor (Signed)
Triage Specialist also sent referral packet for pt to [facility] after calling and being informed that beds were available in their geropsych unit.   Ramond Dial, MS, [place] Triage Specialist

## 2014-09-10 NOTE — ED Notes (Signed)
Pt. Ambulatory to xray.

## 2014-09-10 NOTE — ED Notes (Signed)
Nursing shift note: RN sat and spoke with pt this am. He stated he was a Gary Osborn" and he did not sleep but 3-4 hours. He did not make any self harm statements. He stated that his wife "[name]" is very supportive.  Other than sleep he did not voice any complaints. With encouragement he went into the bathroom to take a shower.  Staff continue to monitor and Q 15 min ck's continue.

## 2014-09-10 NOTE — ED Notes (Signed)
Staff here to do EKG.

## 2014-09-10 NOTE — BH Assessment (Signed)
[place] Assessment Progress Note    The following gero psych facilities were contacted in an attempt to place the pt:  Referral faxed for review: [facility] per [place] (pt under review there)  At capacity:  [facility] per [place] (pt under review there) [facility] per [facility]. [facility] per Tamela Oddi  No answer[facility]kin Nearing at [phone] (pt under review there)  TTS will continue to seek placement for the pt.  Shaune Pascal, MS, [facility] Licensed Professional Counselor Therapeutic Triage Specialist [facility] Phone: [phone] Fax: [phone]

## 2014-09-10 NOTE — BH Assessment (Signed)
Per Kennyth Lose from [place], she will be consulting with their Doctor in reference to putting PT on waiting list.  Kennyth Lose reports she will call back if the PT is to be placed on wait list.

## 2014-09-10 NOTE — BHH Counselor (Signed)
Triage Specialist called the 3 hospitals where pt is currently under review to inquire about status. [facility] due to unit acuity. Referral information was re-faxed to [facility] at [phone], as both hospitals claimed they did not receive the referral info sent by TTS several times over the past few days.  [facility]ined[facility]  Under [facility]/Re[facility]ed: [facility]    Ramond Dial, [place], [facility] Triage Specialist

## 2014-09-10 NOTE — ED Notes (Signed)
Pt. Back from X-ray

## 2014-09-10 NOTE — ED Notes (Signed)
Report received from [facility]. Pt. Sleeping, respirations regular and unlabored. Will continue to monitor for safety. Q 15 minute checks continue.

## 2014-09-11 NOTE — ED Notes (Signed)
Patient has stayed in his room most of the day.  Thinking is disorganized and paranoid.  Obsessed with financial state and believes he is headed for financial ruin.  Patients psychiatrist called today and states that this is the third time this year that patient has attempted suicide.  He admits that patient has been delusional for a while and states that he actually has a lot of money, but has had this fixed delusion for a while now.  He agrees that patient needs to be hospitalized for a while.  Has had a trial of ECT and several medications.  Patient is cooperative with staff.

## 2014-09-12 DIAGNOSIS — R45851 Suicidal ideations: Secondary | ICD-10-CM | POA: Diagnosis not present

## 2014-09-12 DIAGNOSIS — F332 Major depressive disorder, recurrent severe without psychotic features: Secondary | ICD-10-CM | POA: Diagnosis not present

## 2014-09-12 LAB — URINALYSIS, ROUTINE W REFLEX MICROSCOPIC
BILIRUBIN URINE: NEGATIVE
Glucose, UA: NEGATIVE mg/dL
Hgb urine dipstick: NEGATIVE
Ketones, ur: NEGATIVE mg/dL
LEUKOCYTES UA: NEGATIVE
NITRITE: NEGATIVE
Protein, ur: NEGATIVE mg/dL
Specific Gravity, Urine: 1.022 (ref 1.005–1.030)
Urobilinogen, UA: 1 mg/dL (ref 0.0–1.0)
pH: 6.5 (ref 5.0–8.0)

## 2014-09-12 MED ORDER — TRAZODONE HCL 50 MG PO TABS
50.0000 mg | ORAL_TABLET | Freq: Every evening | ORAL | Status: DC | PRN
  Administered 2014-09-12 – 2014-09-13 (×2): 50 mg via ORAL
  Filled 2014-09-12: qty 1

## 2014-09-12 NOTE — Progress Notes (Signed)
The following gero psych facilities were contacted in an attempt to place the pt:  Referral faxed for review: [facility]   At capacity:  [facility]    Declined:  [facility] [address].  [facility]   No answer:

## 2014-09-12 NOTE — Consult Note (Signed)
[facility] Face-to-Face Psychiatry Consult   Reason for Consult:  Suicide attempt Referring Physician:  EDP  Gary Osborn is an 80 y.o. male. Total Time spent with patient: 25 minutes  Assessment: AXIS I:  Severe major depression without psychotic features; suicide attempt AXIS II:  Deferred AXIS III:   Past Medical History  Diagnosis Date  . ALLERGIC RHINITIS 10/05/2007  . ANXIETY 10/05/2007  . COLONIC POLYPS, HX OF 10/05/2007  . DEPRESSION 10/04/2008  . PROSTATE CANCER, UNSPEC. 10/05/2007  . 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  Gero-psychiatry  Subjective:   Gary Osborn is a 80 y.o. male patient admitted with depression and suicide attempt. Pt seen and evaluated by Earleen Newport, NP. This NP helping with dictation and ED overflow. Per above provider, pt continues to have suicidal thoughts that are severe enough to warrant admission; cannot contract for safety. [facility] to [facility] to [facility].   HPI:  Patient remains depressed but does discuss his suicide attempt yesterday.  [place] then "dragged" himself out and was walking down the road wet when the manager at [employer] recognized him and gave him some coffee, brought to the ED.  He said his son lost his job in [place] but is going to [place] and [place], already paid for, and he hopes he has not messed things up for him.  His biggest stressor is financial issues which he still does not want to discuss.  He does stated his wife is angry with him and she hung the phone up yesterday on him and he has not spoken to her since.    HPI  Elements:   Location:  generalized. Quality:  acute. Severity:  severe. Timing:  constant. Duration:  few days. Context:  stressors.  Past Psychiatric History: Past Medical History  Diagnosis Date  . ALLERGIC RHINITIS 10/05/2007  . ANXIETY 10/05/2007  . COLONIC POLYPS, HX OF 10/05/2007  . DEPRESSION 10/04/2008  . PROSTATE CANCER, UNSPEC. 10/05/2007  . 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 He has never used smokeless tobacco. He reports that he drinks about 7.0 oz of alcohol per week. He reports that he does not use illicit drugs. Family History  Problem Relation Age of Onset  . Prostate cancer Father   . Breast cancer Sister    Family History Substance Abuse: No Family Supports: Yes, List: (wife) Living Arrangements: Spouse/significant other (wife) Can pt return to current living arrangement?: Yes Abuse/Neglect [facility]) Physical Abuse: Denies Verbal Abuse: Denies Sexual Abuse: Denies Allergies:  No Known Allergies  ACT Assessment Complete:  Yes:    Educational Status    Risk to Self: Risk to self with the past 6 months Suicidal Ideation: No Suicidal Intent: No Is patient at risk for suicide?: Yes Suicidal Plan?: No Access to Means: Yes Specify Access to Suicidal Means: pt jumped in [place] prior to arrival What has been your use of drugs/alcohol within the last 12 months?: pt denies substance use Previous Attempts/Gestures: Yes How many times?: 3 (including today 09/08/14) Other Self Harm Risks: pt denies Triggers for Past Attempts:  (none reported) Intentional  Self Injurious Behavior: None Family Suicide History: Unknown Recent stressful life event(s): Financial Problems (pt refuses to elaborate) Persecutory voices/beliefs?: No Depression: Yes Depression Symptoms: Feeling worthless/self pity, Feeling angry/irritable, Loss of interest in usual pleasures, Guilt, Isolating, Despondent, Insomnia Substance abuse history and/or  treatment for substance abuse?: No Suicide prevention information given to non-admitted patients: Not applicable  Risk to Others: Risk to Others within the past 6 months Homicidal Ideation: No Thoughts of Harm to Others: No Current Homicidal Intent: No Current Homicidal Plan: No Access to Homicidal Means: No Identified Victim: none History of harm to others?: No Assessment of Violence: None Noted Violent Behavior Description: pt denies hx violence - is polite during assessment Does patient have access to weapons?: No Criminal Charges Pending?: No Does patient have a court date: No  Abuse: Abuse/Neglect Assessment (Assessment to be complete while patient is alone) Physical Abuse: Denies Verbal Abuse: Denies Sexual Abuse: Denies Exploitation of patient/patient's resources: Denies Self-Neglect: Denies  Prior Inpatient Therapy: Prior Inpatient Therapy Prior Inpatient Therapy: Yes Prior Therapy Dates: March 2015, Jan 2015, when pt was age 68 Prior Therapy Facilty/Provider(s): [facility] [place], facility in [facility] Reason for Treatment: depression, suicide attempts  Prior Outpatient Therapy: Prior Outpatient Therapy Prior Outpatient Therapy: Yes Prior Therapy Dates: currently Prior Therapy Facilty/Provider(s): Dr Norma Fredrickson Reason for Treatment: OCD, anxiety D/O  Additional Information: Additional Information 1:1 In Past 12 Months?: No CIRT Risk: No Elopement Risk: No Does patient have medical clearance?: Yes     Objective: Blood pressure 102/72, pulse 86, temperature 98 F (36.7 C), temperature source Oral, resp. rate 18, SpO2 97 %.There is no weight on file to calculate BMI. Results for orders placed or performed during the hospital encounter of 09/08/14 (from the past 72 hour(s))  Urinalysis, Routine w reflex microscopic     Status: None   Collection Time: 09/12/14 10:45 AM  Result Value Ref Range   Color, Urine YELLOW YELLOW   APPearance CLEAR CLEAR   Specific  Gravity, Urine 1.022 1.005 - 1.030   pH 6.5 5.0 - 8.0   Glucose, UA NEGATIVE NEGATIVE mg/dL   Hgb urine dipstick NEGATIVE NEGATIVE   Bilirubin Urine NEGATIVE NEGATIVE   Ketones, ur NEGATIVE NEGATIVE mg/dL   Protein, ur NEGATIVE NEGATIVE mg/dL   Urobilinogen, UA 1.0 0.0 - 1.0 mg/dL   Nitrite NEGATIVE NEGATIVE   Leukocytes, UA NEGATIVE NEGATIVE    Comment: MICROSCOPIC NOT DONE ON URINES WITH NEGATIVE PROTEIN, BLOOD, LEUKOCYTES, NITRITE, OR GLUCOSE <1000 mg/dL.   Labs are reviewed and are pertinent for no medical issues.  Current Facility-Administered Medications  Medication Dose Route Frequency Provider Last Rate Last Dose  . alum & mag hydroxide-simeth (MAALOX/MYLANTA) 200-200-20 MG/5ML suspension 30 mL  30 mL Oral PRN Delora Fuel, MD      . buPROPion (WELLBUTRIN XL) 24 hr tablet 300 mg  300 mg Oral Daily Delora Fuel, MD   818 mg at 09/12/14 1035  . busPIRone (BUSPAR) tablet 10 mg  10 mg Oral TID Bliss Tsang   10 mg at 09/12/14 1037  . clonazePAM (KLONOPIN) tablet 0.5 mg  0.5 mg Oral BID Waylan Boga, NP   0.5 mg at 09/12/14 1037  . ibuprofen (ADVIL,MOTRIN) tablet 600 mg  600 mg Oral E9H PRN Delora Fuel, MD      . ondansetron [facility]) tablet 4 mg  4 mg Oral B7J PRN Delora Fuel, MD      . PARoxetine (PAXIL) tablet 40 mg  40 mg Oral QHS Delora Fuel, MD  40 mg at 09/11/14 2119  . risperiDONE (RISPERDAL) tablet 0.5 mg  0.5 mg Oral QHS Delora Fuel, MD   0.5 mg at 09/11/14 2119  . traZODone (DESYREL) tablet 50 mg  50 mg Oral QHS PRN Gertha Lichtenberg       Current Outpatient Prescriptions  Medication Sig Dispense Refill  . buPROPion (WELLBUTRIN XL) 300 MG 24 hr tablet Take 300 mg by mouth daily.    . busPIRone (BUSPAR) 10 MG tablet Take 20 mg by mouth 2 (two) times daily.     Marland Kitchen LORazepam (ATIVAN) 0.5 MG tablet Take 0.5 mg by mouth 2 (two) times daily.    Marland Kitchen PARoxetine (PAXIL) 40 MG tablet Take 1 tablet (40 mg total) by mouth at bedtime. 30 tablet 0  . risperiDONE (RISPERDAL) 0.5 MG tablet  Take 1 mg by mouth at bedtime.     . temazepam (RESTORIL) 15 MG capsule Take 30 mg by mouth at bedtime.       Psychiatric Specialty Exam:     Blood pressure 102/72, pulse 86, temperature 98 F (36.7 C), temperature source Oral, resp. rate 18, SpO2 97 %.There is no weight on file to calculate BMI.  General Appearance: Casual  Eye Contact::  Good  Speech:  Normal Rate  Volume:  Normal  Mood:  Depressed  Affect:  Congruent  Thought Process:  Coherent  Orientation:  Full (Time, Place, and Person)  Thought Content:  WDL  Suicidal Thoughts:  Yes.  with intent/plan  Homicidal Thoughts:  No  Memory:  Immediate;   Good Recent;   Good Remote;   Good  Judgement:  Poor  Insight:  Fair  Psychomotor Activity:  Decreased  Concentration:  Fair  Recall:  [employer] of Knowledge:Fair  Language: Fair  Akathisia:  No  Handed:  Right  AIMS (if indicated):     Assets:  Housing Leisure Time Physical Health Resilience Social Support  Sleep:      Musculoskeletal: Strength & Muscle Tone: within normal limits Gait & Station: normal Patient leans: N/A  Treatment Plan Summary: Daily contact with patient to assess and evaluate symptoms and progress in treatment Medication management; admit to inpatient psychiatric unit for stabilization  Continue with current treatment plan [facility] psych).    Gary Osborn, [place], FNP-BC  09/12/2014 12:55 PM   Patient seen, evaluated and I agree with notes by Nurse Practitioner. Corena Pilgrim, MD

## 2014-09-13 DIAGNOSIS — R45851 Suicidal ideations: Secondary | ICD-10-CM | POA: Diagnosis not present

## 2014-09-13 DIAGNOSIS — F332 Major depressive disorder, recurrent severe without psychotic features: Secondary | ICD-10-CM | POA: Diagnosis not present

## 2014-09-13 NOTE — Consult Note (Signed)
[facility] Face-to-Face Psychiatry Consult   Reason for Consult:  Suicide attempt Referring Physician:  EDP  CLENNON Osborn is an 80 y.o. male. Total Time spent with patient: 25 minutes  Assessment: AXIS I:  Severe major depression without psychotic features; suicide attempt AXIS II:  Deferred AXIS III:   Past Medical History  Diagnosis Date  . ALLERGIC RHINITIS 10/05/2007  . ANXIETY 10/05/2007  . COLONIC POLYPS, HX OF 10/05/2007  . DEPRESSION 10/04/2008  . PROSTATE CANCER, UNSPEC. 10/05/2007  . 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  Gero-psychiatry  Subjective:   Gary Osborn is a 80 y.o. male patient admitted with depression and suicide attempt.   HPI:  Patient remains depressed but does discuss his suicide attempt yesterday.  [place] then "dragged" himself out and was walking down the road wet when the manager at [employer] recognized him and gave him some coffee, brought to the ED.  He said his son lost his job in [place] but is going to [place] and [place], already paid for, and he hopes he has not messed things up for him. Continues to endorse depressed mood, anhedonia, and suicidal ideation in the context of financial issues.  Patient denies homicidal ideation.  Denies auditory or visual hallucinations.  No evidence of delusions and does not attend to internal stimuli.  Patient reports being ok with plan for admission to geriatric psychiatric bed when available. Remains calm and cooperative in the SAPPU. HPI Elements:   Location:  generalized. Quality:  acute. Severity:   severe. Timing:  constant. Duration:  few days. Context:  stressors.  Past Psychiatric History: Past Medical History  Diagnosis Date  . ALLERGIC RHINITIS 10/05/2007  . ANXIETY 10/05/2007  . COLONIC POLYPS, HX OF 10/05/2007  . DEPRESSION 10/04/2008  . PROSTATE CANCER, UNSPEC. 10/05/2007  . 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 He has never used smokeless tobacco. He reports that he drinks about 7.0 oz of alcohol per week. He reports that he does not use illicit drugs. Family History  Problem Relation Age of Onset  . Prostate cancer Father   . Breast cancer Sister    Family History Substance Abuse: No Family Supports: Yes, List: (wife) Living Arrangements: Spouse/significant other (wife) Can pt return to current living arrangement?: Yes Abuse/Neglect [facility]) Physical Abuse: Denies Verbal Abuse: Denies Sexual Abuse: Denies Allergies:  No Known Allergies  ACT Assessment Complete:  Yes:    Educational Status    Risk to Self: Risk to self with the past 6 months Suicidal Ideation: No Suicidal Intent: No Is patient at risk for suicide?: Yes Suicidal Plan?: No Access to Means: Yes Specify Access to Suicidal Means: pt jumped in [place] prior to arrival What has been your use of drugs/alcohol within the last 12 months?: pt denies substance use Previous Attempts/Gestures: Yes How many times?: 3 (including today 09/08/14) Other Self Harm Risks: pt denies Triggers for Past Attempts:  (none reported) Intentional Self Injurious Behavior: None Family Suicide History: Unknown Recent stressful life event(s): Financial Problems (pt refuses to elaborate) Persecutory voices/beliefs?: No Depression: Yes Depression Symptoms: Feeling worthless/self pity,  Feeling angry/irritable, Loss of interest in usual pleasures, Guilt, Isolating, Despondent, Insomnia Substance abuse history and/or treatment for substance abuse?: No Suicide prevention information  given to non-admitted patients: Not applicable  Risk to Others: Risk to Others within the past 6 months Homicidal Ideation: No Thoughts of Harm to Others: No Current Homicidal Intent: No Current Homicidal Plan: No Access to Homicidal Means: No Identified Victim: none History of harm to others?: No Assessment of Violence: None Noted Violent Behavior Description: pt denies hx violence - is polite during assessment Does patient have access to weapons?: No Criminal Charges Pending?: No Does patient have a court date: No  Abuse: Abuse/Neglect Assessment (Assessment to be complete while patient is alone) Physical Abuse: Denies Verbal Abuse: Denies Sexual Abuse: Denies Exploitation of patient/patient's resources: Denies Self-Neglect: Denies  Prior Inpatient Therapy: Prior Inpatient Therapy Prior Inpatient Therapy: Yes Prior Therapy Dates: March 2015, Jan 2015, when pt was age 70 Prior Therapy Facilty/Provider(s): [facility], [place], facility in [facility] Reason for Treatment: depression, suicide attempts  Prior Outpatient Therapy: Prior Outpatient Therapy Prior Outpatient Therapy: Yes Prior Therapy Dates: currently Prior Therapy Facilty/Provider(s): Dr Norma Fredrickson Reason for Treatment: OCD, anxiety D/O  Additional Information: Additional Information 1:1 In Past 12 Months?: No CIRT Risk: No Elopement Risk: No Does patient have medical clearance?: Yes     Objective: Blood pressure 95/56, pulse 92, temperature 98 F (36.7 C), temperature source Oral, resp. rate 14, SpO2 99 %.There is no weight on file to calculate BMI. Results for orders placed or performed during the hospital encounter of 09/08/14 (from the past 72 hour(s))  Urinalysis, Routine w reflex microscopic     Status: None   Collection Time: 09/12/14 10:45 AM  Result Value Ref Range   Color, Urine YELLOW YELLOW   APPearance CLEAR CLEAR   Specific Gravity, Urine 1.022 1.005 - 1.030   pH 6.5 5.0 - 8.0   Glucose, UA  NEGATIVE NEGATIVE mg/dL   Hgb urine dipstick NEGATIVE NEGATIVE   Bilirubin Urine NEGATIVE NEGATIVE   Ketones, ur NEGATIVE NEGATIVE mg/dL   Protein, ur NEGATIVE NEGATIVE mg/dL   Urobilinogen, UA 1.0 0.0 - 1.0 mg/dL   Nitrite NEGATIVE NEGATIVE   Leukocytes, UA NEGATIVE NEGATIVE    Comment: MICROSCOPIC NOT DONE ON URINES WITH NEGATIVE PROTEIN, BLOOD, LEUKOCYTES, NITRITE, OR GLUCOSE <1000 mg/dL.   Labs are reviewed and are pertinent for no medical issues.  Medications reviewed and are well tolerated   Current Facility-Administered Medications  Medication Dose Route Frequency Provider Last Rate Last Dose  . alum & mag hydroxide-simeth (MAALOX/MYLANTA) 200-200-20 MG/5ML suspension 30 mL  30 mL Oral PRN Delora Fuel, MD      . buPROPion (WELLBUTRIN XL) 24 hr tablet 300 mg  300 mg Oral Daily Delora Fuel, MD   378 mg at 09/13/14 0919  . busPIRone (BUSPAR) tablet 10 mg  10 mg Oral TID Azriel Jakob   10 mg at 09/13/14 0919  . clonazePAM (KLONOPIN) tablet 0.5 mg  0.5 mg Oral BID Waylan Boga, NP   0.5 mg at 09/13/14 0919  . ibuprofen (ADVIL,MOTRIN) tablet 600 mg  600 mg Oral H8I PRN Delora Fuel, MD      . ondansetron [facility]) tablet 4 mg  4 mg Oral F0Y PRN Delora Fuel, MD      . PARoxetine (PAXIL) tablet 40 mg  40 mg Oral QHS Delora Fuel, MD   40 mg at 77/41/28 2139  . risperiDONE (RISPERDAL) tablet 0.5 mg  0.5 mg Oral QHS Shanon Brow  Roxanne Mins, MD   0.5 mg at 09/12/14 2138  . traZODone (DESYREL) tablet 50 mg  50 mg Oral QHS PRN Hayzen Lorenson   50 mg at 09/12/14 2139   Current Outpatient Prescriptions  Medication Sig Dispense Refill  . buPROPion (WELLBUTRIN XL) 300 MG 24 hr tablet Take 300 mg by mouth daily.    . busPIRone (BUSPAR) 10 MG tablet Take 20 mg by mouth 2 (two) times daily.     Marland Kitchen LORazepam (ATIVAN) 0.5 MG tablet Take 0.5 mg by mouth 2 (two) times daily.    Marland Kitchen PARoxetine (PAXIL) 40 MG tablet Take 1 tablet (40 mg total) by mouth at bedtime. 30 tablet 0  . risperiDONE (RISPERDAL) 0.5 MG tablet  Take 1 mg by mouth at bedtime.     . temazepam (RESTORIL) 15 MG capsule Take 30 mg by mouth at bedtime.       Psychiatric Specialty Exam:     Blood pressure 95/56, pulse 92, temperature 98 F (36.7 C), temperature source Oral, resp. rate 14, SpO2 99 %.There is no weight on file to calculate BMI.  General Appearance: Casual  Eye Contact:  Good  Speech:  Normal Rate  Volume:  Normal  Mood:  Depressed  Affect:  Congruent  Thought Process:  Coherent  Orientation:  Full (Time, Place, and Person)  Thought Content:  WDL  Suicidal Thoughts:  Yes.  with intent/plan  Homicidal Thoughts:  No  Memory:  Immediate;   Good Recent;   Good Remote;   Good  Judgement:  Poor  Insight:  Fair  Psychomotor Activity:  Decreased  Concentration:  Fair  Recall:  [employer] of Knowledge:Fair  Language: Fair  Akathisia:  No  Handed:  Right  AIMS (if indicated):     Assets:  Housing Leisure Time Physical Health Resilience Social Support  Sleep:      Musculoskeletal: Strength & Muscle Tone: within normal limits Gait & Station: normal Patient leans: N/A  Treatment Plan Summary: Daily contact with patient to assess and evaluate symptoms and progress in treatment Medication management; admit to inpatient psychiatric unit for stabilization    Continue with current treatment plan [facility]).    Kennedy Bucker, [place]   09/13/2014 5:27 PM    Patient seen, evaluated and I agree with notes by Nurse Practitioner. Corena Pilgrim, MD

## 2014-09-13 NOTE — BH Assessment (Signed)
[facility] Assessment Progress Note  Marlowe Kays from [facility] called at 13:02 to report that pt is now on their wait list.  Jalene Mullet, MA Triage Specialist 09/13/2014 @ 13:18

## 2014-09-13 NOTE — ED Notes (Signed)
Patient endorsing depression with a dull, flat affect. Pt verbally contracts for safety while on unit. No inappropriate behaviors noted at this time. Pt denies SI at this time or plan to harm himself. Pt states he would rather stay in the ED for treatment instead of being admitted elsewhere. RN explained no inpatient facilitites here.

## 2014-09-13 NOTE — Progress Notes (Signed)
CSW obtained authorization for [facility] [id number]. CSW referred patient to [facility]. CSW completed demographics with [facility] and confirmed fax received.   Noreene Larsson [phone]  ED CSW 09/13/2014 9:12 AM

## 2014-09-14 ENCOUNTER — Encounter (HOSPITAL_COMMUNITY): Payer: Self-pay | Admitting: *Deleted

## 2014-09-14 ENCOUNTER — Inpatient Hospital Stay (HOSPITAL_COMMUNITY)
Admission: AD | Admit: 2014-09-14 | Discharge: 2014-09-28 | DRG: 885 | Disposition: A | Discharge status: Other Acute Inpatient Hospital | Payer: Medicare Other | Source: Intra-hospital | Attending: Psychiatry | Admitting: Psychiatry

## 2014-09-14 ENCOUNTER — Telehealth (HOSPITAL_COMMUNITY): Payer: Self-pay | Admitting: Licensed Clinical Social Worker

## 2014-09-14 DIAGNOSIS — F332 Major depressive disorder, recurrent severe without psychotic features: Secondary | ICD-10-CM | POA: Diagnosis not present

## 2014-09-14 DIAGNOSIS — R45851 Suicidal ideations: Secondary | ICD-10-CM | POA: Diagnosis present

## 2014-09-14 DIAGNOSIS — F333 Major depressive disorder, recurrent, severe with psychotic symptoms: Secondary | ICD-10-CM

## 2014-09-14 DIAGNOSIS — Z609 Problem related to social environment, unspecified: Secondary | ICD-10-CM | POA: Diagnosis present

## 2014-09-14 DIAGNOSIS — Z8546 Personal history of malignant neoplasm of prostate: Secondary | ICD-10-CM | POA: Diagnosis not present

## 2014-09-14 DIAGNOSIS — Z818 Family history of other mental and behavioral disorders: Secondary | ICD-10-CM | POA: Diagnosis not present

## 2014-09-14 DIAGNOSIS — F411 Generalized anxiety disorder: Secondary | ICD-10-CM | POA: Diagnosis not present

## 2014-09-14 DIAGNOSIS — Z23 Encounter for immunization: Secondary | ICD-10-CM

## 2014-09-14 DIAGNOSIS — G4700 Insomnia, unspecified: Secondary | ICD-10-CM | POA: Diagnosis not present

## 2014-09-14 DIAGNOSIS — K5900 Constipation, unspecified: Secondary | ICD-10-CM | POA: Diagnosis present

## 2014-09-14 DIAGNOSIS — Z915 Personal history of self-harm: Secondary | ICD-10-CM | POA: Diagnosis not present

## 2014-09-14 DIAGNOSIS — Z8042 Family history of malignant neoplasm of prostate: Secondary | ICD-10-CM

## 2014-09-14 DIAGNOSIS — F42 Obsessive-compulsive disorder: Secondary | ICD-10-CM | POA: Diagnosis not present

## 2014-09-14 DIAGNOSIS — Z87891 Personal history of nicotine dependence: Secondary | ICD-10-CM

## 2014-09-14 DIAGNOSIS — F05 Delirium due to known physiological condition: Secondary | ICD-10-CM | POA: Diagnosis not present

## 2014-09-14 MED ORDER — RISPERIDONE 0.5 MG PO TABS
0.5000 mg | ORAL_TABLET | Freq: Every day | ORAL | Status: DC
  Administered 2014-09-14: 0.5 mg via ORAL
  Filled 2014-09-14 (×3): qty 1

## 2014-09-14 MED ORDER — TRAZODONE HCL 50 MG PO TABS: 50.0000 mg | ORAL_TABLET | Freq: Every evening | ORAL | Status: DC | PRN

## 2014-09-14 MED ORDER — ACETAMINOPHEN 325 MG PO TABS
650.0000 mg | ORAL_TABLET | Freq: Four times a day (QID) | ORAL | Status: DC | PRN
Start: 2014-09-14 — End: 2014-09-28

## 2014-09-14 MED ORDER — CLONAZEPAM 0.5 MG PO TABS
0.5000 mg | ORAL_TABLET | Freq: Two times a day (BID) | ORAL | Status: DC
  Administered 2014-09-14 – 2014-09-19 (×10): 0.5 mg via ORAL
  Filled 2014-09-14 (×10): qty 1

## 2014-09-14 MED ORDER — PAROXETINE HCL 20 MG PO TABS
40.0000 mg | ORAL_TABLET | Freq: Every day | ORAL | Status: DC
  Administered 2014-09-14 – 2014-09-27 (×14): 40 mg via ORAL
  Filled 2014-09-14 (×18): qty 2

## 2014-09-14 MED ORDER — PNEUMOCOCCAL VAC POLYVALENT 25 MCG/0.5ML IJ INJ: 0.5000 mL | INJECTION | INTRAMUSCULAR | Status: DC

## 2014-09-14 MED ORDER — BUSPIRONE HCL 10 MG PO TABS
10.0000 mg | ORAL_TABLET | Freq: Three times a day (TID) | ORAL | Status: DC
  Administered 2014-09-14 – 2014-09-26 (×36): 10 mg via ORAL
  Filled 2014-09-14 (×26): qty 1
  Filled 2014-09-14: qty 2
  Filled 2014-09-14 (×3): qty 1
  Filled 2014-09-14: qty 2
  Filled 2014-09-14 (×10): qty 1
  Filled 2014-09-14: qty 2
  Filled 2014-09-14 (×2): qty 1

## 2014-09-14 MED ORDER — BUPROPION HCL ER (XL) 300 MG PO TB24
300.0000 mg | ORAL_TABLET | Freq: Every day | ORAL | Status: DC
  Administered 2014-09-15: 300 mg via ORAL
  Filled 2014-09-14 (×2): qty 1

## 2014-09-14 MED ORDER — MAGNESIUM HYDROXIDE 400 MG/5ML PO SUSP
30.0000 mL | Freq: Every day | ORAL | Status: DC | PRN
  Administered 2014-09-23 – 2014-09-27 (×2): 30 mL via ORAL
  Filled 2014-09-14 (×2): qty 30

## 2014-09-14 NOTE — Tx Team (Signed)
Initial Interdisciplinary Treatment Plan   PATIENT STRESSORS: Financial difficulties Health problems Marital or family conflict   PATIENT STRENGTHS: Ability for insight Average or above average intelligence Capable of independent living General fund of knowledge Physical Health Supportive family/friends   PROBLEM LIST: Problem List/Patient Goals Date to be addressed Date deferred Reason deferred Estimated date of resolution  "feel better not be depressed" 09/14/2014     "would like to sleep well" 09/14/2014                                                DISCHARGE CRITERIA:  Ability to meet basic life and health needs Improved stabilization in mood, thinking, and/or behavior Motivation to continue treatment in a less acute level of care Need for constant or close observation no longer present Verbal commitment to aftercare and medication compliance  PRELIMINARY DISCHARGE PLAN: Attend aftercare/continuing care group Outpatient therapy Return to previous living arrangement  PATIENT/FAMIILY INVOLVEMENT: This treatment plan has been presented to and reviewed with the patient, Gary Osborn.  The patient has been given the opportunity to ask questions and make suggestions.  Karel Jarvis 09/14/2014, 6:28 PM

## 2014-09-14 NOTE — Consult Note (Signed)
[facility] Face-to-Face Psychiatry Consult   Reason for Consult:  Suicide attempt Referring Physician:  EDP  Gary Osborn is an 80 y.o. male. Total Time spent with patient: 25 minutes  Assessment: AXIS I:  Severe major depression without psychotic features; suicide attempt AXIS II:  Deferred AXIS III:   Past Medical History  Diagnosis Date  . ALLERGIC RHINITIS 10/05/2007  . ANXIETY 10/05/2007  . COLONIC POLYPS, HX OF 10/05/2007  . DEPRESSION 10/04/2008  . PROSTATE CANCER, UNSPEC. 10/05/2007  . 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  Gero-psychiatry  Subjective:   Gary Osborn is a 80 y.o. male patient admitted with depression and suicide attempt.   HPI:  Patient remains depressed but does discuss his suicide attempt yesterday.  [place] then "dragged" himself out and was walking down the road wet when the manager at [employer] recognized him and gave him some coffee, brought to the ED.  He said his son lost his job in [place] but is going to [place] and [place], already paid for, and he hopes he has not messed things up for him. Continues to endorse depressed mood, anhedonia, and suicidal ideation in the context of financial issues.  Patient denies homicidal ideation.  Denies auditory or visual hallucinations.  No evidence of delusions and does not attend to internal stimuli.  Patient reports being ok with plan for admission to geriatric psychiatric bed when available. Remains calm and cooperative in the [facility].  Today:  Today is the first day that patient has denied suicidal ideation;  but states that he continues to be nervous and has nervous shakes.  Patient states that he is afraid to go home.  Will continue to seek inpatient treatment.  HPI Elements:   Location:  generalized. Quality:  acute. Severity:  severe. Timing:  constant. Duration:  few days. Context:  stressors.  Past Psychiatric History: Past Medical History  Diagnosis Date  . ALLERGIC RHINITIS 10/05/2007  . ANXIETY 10/05/2007  . COLONIC POLYPS, HX OF 10/05/2007  . DEPRESSION 10/04/2008  . PROSTATE CANCER, UNSPEC. 10/05/2007  . 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 He has never used smokeless tobacco. He reports that he drinks about 7.0 oz of alcohol per week. He reports that he does not use illicit drugs. Family History  Problem Relation Age of Onset  . Prostate cancer Father   . Breast cancer Sister    Family History Substance Abuse: No Family Supports: Yes, List: (wife) Living Arrangements: Spouse/significant other (wife) Can pt return to current living arrangement?: Yes Abuse/Neglect [facility]) Physical Abuse: Denies Verbal Abuse: Denies Sexual Abuse: Denies Allergies:  No Known Allergies  ACT Assessment Complete:  Yes:    Educational Status    Risk to Self: Risk to self with the past 6 months Suicidal Ideation: No Suicidal Intent: No Is patient at risk for suicide?: Yes Suicidal Plan?: No Access to Means: Yes Specify Access to Suicidal Means: pt jumped in [place] prior to arrival What has been your use of drugs/alcohol within the last 12 months?: pt denies substance use Previous Attempts/Gestures: Yes How many times?: 3 (  including today 09/08/14) Other Self Harm Risks: pt denies Triggers for Past Attempts:  (none reported) Intentional Self Injurious Behavior: None Family Suicide History: Unknown Recent stressful life event(s): Financial Problems (pt refuses to elaborate) Persecutory voices/beliefs?: No Depression: Yes Depression Symptoms:  Feeling worthless/self pity, Feeling angry/irritable, Loss of interest in usual pleasures, Guilt, Isolating, Despondent, Insomnia Substance abuse history and/or treatment for substance abuse?: No Suicide prevention information given to non-admitted patients: Not applicable  Risk to Others: Risk to Others within the past 6 months Homicidal Ideation: No Thoughts of Harm to Others: No Current Homicidal Intent: No Current Homicidal Plan: No Access to Homicidal Means: No Identified Victim: none History of harm to others?: No Assessment of Violence: None Noted Violent Behavior Description: pt denies hx violence - is polite during assessment Does patient have access to weapons?: No Criminal Charges Pending?: No Does patient have a court date: No  Abuse: Abuse/Neglect Assessment (Assessment to be complete while patient is alone) Physical Abuse: Denies Verbal Abuse: Denies Sexual Abuse: Denies Exploitation of patient/patient's resources: Denies Self-Neglect: Denies  Prior Inpatient Therapy: Prior Inpatient Therapy Prior Inpatient Therapy: Yes Prior Therapy Dates: March 2015, Jan 2015, when pt was age 47 Prior Therapy Facilty/Provider(s): [facility], [place], facility in [facility] Reason for Treatment: depression, suicide attempts  Prior Outpatient Therapy: Prior Outpatient Therapy Prior Outpatient Therapy: Yes Prior Therapy Dates: currently Prior Therapy Facilty/Provider(s): Dr Norma Fredrickson Reason for Treatment: OCD, anxiety D/O  Additional Information: Additional Information 1:1 In Past 12 Months?: No CIRT Risk: No Elopement Risk: No Does patient have medical clearance?: Yes     Objective: Blood pressure 109/85, pulse 81, temperature 98.2 F (36.8 C), temperature source Oral, resp. rate 14, SpO2 97 %.There is no weight on file to calculate BMI. Results for orders placed or performed during the hospital encounter of 09/08/14 (from the past 72 hour(s))  Urinalysis, Routine w reflex  microscopic     Status: None   Collection Time: 09/12/14 10:45 AM  Result Value Ref Range   Color, Urine YELLOW YELLOW   APPearance CLEAR CLEAR   Specific Gravity, Urine 1.022 1.005 - 1.030   pH 6.5 5.0 - 8.0   Glucose, UA NEGATIVE NEGATIVE mg/dL   Hgb urine dipstick NEGATIVE NEGATIVE   Bilirubin Urine NEGATIVE NEGATIVE   Ketones, ur NEGATIVE NEGATIVE mg/dL   Protein, ur NEGATIVE NEGATIVE mg/dL   Urobilinogen, UA 1.0 0.0 - 1.0 mg/dL   Nitrite NEGATIVE NEGATIVE   Leukocytes, UA NEGATIVE NEGATIVE    Comment: MICROSCOPIC NOT DONE ON URINES WITH NEGATIVE PROTEIN, BLOOD, LEUKOCYTES, NITRITE, OR GLUCOSE <1000 mg/dL.   Labs are reviewed and are pertinent for no medical issues.  Medications reviewed and are well tolerated   Current Facility-Administered Medications  Medication Dose Route Frequency Provider Last Rate Last Dose  . alum & mag hydroxide-simeth (MAALOX/MYLANTA) 200-200-20 MG/5ML suspension 30 mL  30 mL Oral PRN Delora Fuel, MD      . buPROPion (WELLBUTRIN XL) 24 hr tablet 300 mg  300 mg Oral Daily Delora Fuel, MD   272 mg at 09/14/14 0750  . busPIRone (BUSPAR) tablet 10 mg  10 mg Oral TID Cassidy Tabet   10 mg at 09/14/14 0749  . clonazePAM (KLONOPIN) tablet 0.5 mg  0.5 mg Oral BID Waylan Boga, NP   0.5 mg at 09/14/14 0749  . ibuprofen (ADVIL,MOTRIN) tablet 600 mg  600 mg Oral Z3G PRN Delora Fuel, MD      . ondansetron [facility]) tablet 4 mg  4 mg  Oral F6B PRN Delora Fuel, MD      . PARoxetine (PAXIL) tablet 40 mg  40 mg Oral QHS Delora Fuel, MD   40 mg at 84/66/59 2137  . risperiDONE (RISPERDAL) tablet 0.5 mg  0.5 mg Oral QHS Delora Fuel, MD   0.5 mg at 09/13/14 2137  . traZODone (DESYREL) tablet 50 mg  50 mg Oral QHS PRN Collin Rengel   50 mg at 09/13/14 2137   Current Outpatient Prescriptions  Medication Sig Dispense Refill  . buPROPion (WELLBUTRIN XL) 300 MG 24 hr tablet Take 300 mg by mouth daily.    . busPIRone (BUSPAR) 10 MG tablet Take 20 mg by mouth 2 (two) times  daily.     Marland Kitchen LORazepam (ATIVAN) 0.5 MG tablet Take 0.5 mg by mouth 2 (two) times daily.    Marland Kitchen PARoxetine (PAXIL) 40 MG tablet Take 1 tablet (40 mg total) by mouth at bedtime. 30 tablet 0  . risperiDONE (RISPERDAL) 0.5 MG tablet Take 1 mg by mouth at bedtime.     . temazepam (RESTORIL) 15 MG capsule Take 30 mg by mouth at bedtime.       Psychiatric Specialty Exam:     Blood pressure 109/85, pulse 81, temperature 98.2 F (36.8 C), temperature source Oral, resp. rate 14, SpO2 97 %.There is no weight on file to calculate BMI.  General Appearance: Casual  Eye Contact:  Good  Speech:  Normal Rate  Volume:  Normal  Mood:  Depressed  Affect:  Congruent  Thought Process:  Coherent  Orientation:  Full (Time, Place, and Person)  Thought Content:  WDL  Suicidal Thoughts:  Yes.  with intent/plan  Homicidal Thoughts:  No  Memory:  Immediate;   Good Recent;   Good Remote;   Good  Judgement:  Poor  Insight:  Fair  Psychomotor Activity:  Decreased  Concentration:  Fair  Recall:  [employer] of Knowledge:Fair  Language: Fair  Akathisia:  No  Handed:  Right  AIMS (if indicated):     Assets:  Housing Leisure Time Physical Health Resilience Social Support  Sleep:      Musculoskeletal: Strength & Muscle Tone: within normal limits Gait & Station: normal Patient leans: N/A  Treatment Plan Summary: Daily contact with patient to assess and evaluate symptoms and progress in treatment Medication management    Continue to seek inpatient treatment for stabilization  Patient has been accepted by [facility] 307/01   Rankin, Shuvon, FNP-BC 09/14/2014 2:27 PM    Patient seen, evaluated and I agree with notes by Nurse Practitioner. Corena Pilgrim, MD

## 2014-09-14 NOTE — ED Notes (Signed)
Patient will be transferred to [facility]

## 2014-09-14 NOTE — BH Assessment (Signed)
Inpt recommended and no placement found as of yet from previous referrals. Sent referral to additional facilities with gero psych programs:   Gary Osborn  [place], [place] Triage Specialist 09/14/2014 12:10 AM

## 2014-09-14 NOTE — ED Notes (Signed)
Patient pleasant on approach today. Patient was able to tell undersigned that he made a suicide attempt but did not give any reason except for he was feeling stressed. No specifics. Patient vague about what caused it. Currently denies any SI. Patient anxious about if and when he will be discharged. Cooperative on the unit.

## 2014-09-14 NOTE — BH Assessment (Signed)
[place] Assessment Progress Note  Pt accepted to  [facility] by Corena Pilgrim, MD.  Per Waldon Merl, TS, pt has been assigned to Rm 307-1.  Pt signed Voluntary Admission and Consent for Treatment, as well as Consent to Release Information to Norma Fredrickson, MD and to his wife, Ara Mano.  Paperwork has been faxed to [facility] and pt's nurse has been notified.  She agrees to send original paperwork with pt via Betsy Pries, and to call report to [phone].  Jalene Mullet, MA Triage Specialist 09/14/2014 @ 10:59

## 2014-09-14 NOTE — Progress Notes (Signed)
CSW confirmed with Marlowe Kays pt remains on [facility] waitlist.  Pt under review at [facility], [place], and [place].   Pt declined from [facility].  Pt declined from [facility].   Noreene Larsson [phone]  ED CSW 09/14/2014 911am

## 2014-09-14 NOTE — BH Assessment (Signed)
Patient accepted to [facility] by Dr. Tiburcio Pea and Earleen Newport, NP. Patient's room assignment is 307-1. Nursing report [phone]. Support paperwork completed.

## 2014-09-14 NOTE — Progress Notes (Signed)
Pt was admitted today for increase in depression and suicidal ideation.  He [place] himself  and was found walking down [address] soaking wet and a by-stander got him to the ED.  He has a long history of depression and several past attempts of suicide.  He denies now and dopes contract verbally to come to staff before acting on any self-harm thoughts.  He had an argument with his wife but would not elaborate at this time.  He feels he has financial issues but reported that Dr. Judieth Keens claims pt has "no issues" Pt has a medical hx colon polyps, prostate cancer, sinus bradycardia, anxiety and depression to name a few.  He did have ECT treatments in the past. Pt does wear depends and is capable of changing them himself.  Since his prostate was removed his bladder leaks all the time. He should be a high fall risk mainly doe to his age and medications.  He has not has a fall in past 6 months. He has a skin tear to his right elbow open to air no drainage and many bruised to BUE he has abrasion to his right lower leg "from the lake".  Pt was oriented to unit and given his dinner tray.  He is planning to shower. He denied any H/I or A/V/H.

## 2014-09-15 DIAGNOSIS — F332 Major depressive disorder, recurrent severe without psychotic features: Secondary | ICD-10-CM

## 2014-09-15 MED ORDER — RISPERIDONE 1 MG PO TABS
1.0000 mg | ORAL_TABLET | Freq: Every day | ORAL | Status: DC
  Administered 2014-09-15 – 2014-09-19 (×5): 1 mg via ORAL
  Filled 2014-09-15 (×6): qty 1

## 2014-09-15 MED ORDER — RISPERIDONE 0.5 MG PO TABS
0.5000 mg | ORAL_TABLET | Freq: Every morning | ORAL | Status: DC
  Administered 2014-09-16 – 2014-09-18 (×3): 0.5 mg via ORAL
  Filled 2014-09-15 (×5): qty 1

## 2014-09-15 MED ORDER — MIRTAZAPINE 7.5 MG PO TABS
7.5000 mg | ORAL_TABLET | Freq: Every day | ORAL | Status: DC
  Administered 2014-09-15 – 2014-09-17 (×3): 7.5 mg via ORAL
  Filled 2014-09-15 (×5): qty 1

## 2014-09-15 NOTE — Progress Notes (Signed)
Patient ID: Gary Osborn, male   DOB: [birth year], 80 y.o.   MRN: [id number] PER STATE REGULATIONS 482.30  THIS CHART WAS REVIEWED FOR MEDICAL NECESSITY WITH RESPECT TO THE PATIENT'S ADMISSION/DURATION OF STAY.  NEXT REVIEW DATE: 09/19/14  Roma Schanz, RN, BSN CASE MANAGER

## 2014-09-15 NOTE — Tx Team (Signed)
Interdisciplinary Treatment Plan Update (Adult) Date: 09/15/2014   Time Reviewed: 9:30 AM  Progress in Treatment: Attending groups: Continuing to assess, patient new to milieu Participating in groups: Continuing to assess, patient new to milieu Taking medication as prescribed: Yes Tolerating medication: Yes Family/Significant other contact made: No, CSW continuing to assess for appropriate collateral contact Patient understands diagnosis: Yes Discussing patient identified problems/goals with staff: Yes Medical problems stabilized or resolved: Yes Denies suicidal/homicidal ideation: Treatment team continuing to assess Issues/concerns per patient self-inventory: Yes Other:  New problem(s) identified: N/A  Discharge Plan or Barriers: Patient plans to return home to follow up with Dr. Casimiro Needle at [facility] for medication management services, declines therapy referral.  Reason for Continuation of Hospitalization:  Depression Anxiety Medication Stabilization   Comments: N/A  Estimated length of stay: 3-5 days  For review of initial/current patient goals, please see plan of care. From TTS AX: "Gary Osborn is an 80 y.o. male. Patient presents voluntarily after [place]. Pt states he was trying to kill himself d/t "being worried about money":. Pt then says, "I'd rather not go into it." Pt is a poor historian as he does not elaborate on answers and he is guarded. Pt is oriented x 4 and is polite. Pt denies current SI. Per chart review, pt was admitted to [facility] in March 2015 for OCD and anxiety d/o. Pt reports suicide attempt in Jan 2015 by trying to cut his throat and also trying to jump into traffic. Pt sts he goes to Dr Casimiro Needle for med management. Pt sts he was admitted inpatient for psychiatric issues at age 25 at a facility in [place]. Pt describes mood as "worried". He endorses isolating bx, loss of interest in usual pleasures, anger/irritability,  worthlessness, guilt."   Attendees: Patient:    Family:    Physician: Dr. Parke Poisson; Dr. Sabra Heck 09/15/2014 9:30 AM  Nursing: Charlyne Quale, RN 09/15/2014 9:30 AM  Clinical Social Worker: Tilden Fossa,  [facility] 09/15/2014 9:30 AM  Other: Joette Catching, LCSW 09/15/2014 9:30 AM  Other: Lucinda Dell, [place] Sessions Liaison 09/15/2014 9:30 AM  Other: Lars Pinks, Case Manager 09/15/2014 9:30 AM  Other: Edwyna Shell, LCSW 09/15/2014 9:30 AM  Other:       Scribe for Treatment Team:  Tilden Fossa, MSW, [employer] [phone]

## 2014-09-15 NOTE — [facility] Suicide Risk Assessment (Signed)
   Nursing information obtained from:    Demographic factors:   80 year old retired man, lives with wife  Current Mental Status:   See below Loss Factors:   Concerned about finances and possible trouble with IRS Historical Factors:   History of Depression, history of suicidal attempts  Risk Reduction Factors:   Resilience  Total Time spent with patient: 45 minutes  CLINICAL FACTORS:  Worsening depression, frequent ruminations ( seem to be of delusional intensity) that because he did not  Include some annuity type investment in his tax filing, he will end up being incarcerated, destitute, and homeless   Psychiatric Specialty Exam: Physical Exam  ROS  Blood pressure 102/70, pulse 71, temperature 97.6 F (36.4 C), temperature source Oral, resp. rate 16, height 5' 8.75" (1.746 m), weight 76.204 kg (168 lb).Body mass index is 25 kg/(m^2).  General Appearance: Fairly Groomed  Engineer, water::  Good  Speech:  Slow  Volume:  Decreased  Mood:  Anxious and Depressed  Affect:  anxious, sad, although reactive  Thought Process:  Linear  Orientation:  Other:  fully alert and attentive  Thought Content:  denies hallucinations, does not appear internally preoccupied.  Quite ruminative about stressors, which seem to be of  delusional intensity- states that he did not declare an annuity in his tax file, and that this will result in his being incarcerated and become destitute and homeless.  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  Suicidal Thoughts:  Yes.  without intent/plan- admits he has a desire to die, but at this time denies plan or intention of hurting himself and contracts for safety on the unit  Homicidal Thoughts:  No  Memory:  recent and remote intact  Judgement:  Poor  Insight:  Lacking  Psychomotor Activity:  Decreased  Concentration:  Good  Recall:  Good  Fund of Knowledge:Good  Language: Good  Akathisia:   Negative  Handed:  Right  AIMS (if indicated):     Assets:  Desire for Improvement Resilience Social Support  Sleep:  Number of Hours: 5.25   Musculoskeletal: Strength & Muscle Tone: within normal limits Gait & Station:walks slowly  Patient leans: N/A  COGNITIVE FEATURES THAT CONTRIBUTE TO RISK:  Closed-mindedness Polarized thinking    SUICIDE RISK:   Moderate:  Frequent suicidal ideation with limited intensity, and duration, some specificity in terms of plans, no associated intent, good self-control, limited dysphoria/symptomatology, some risk factors present, and identifiable protective factors, including available and accessible social support.  PLAN OF CARE:Patient will be admitted to inpatient psychiatric unit for stabilization and safety. Will provide and encourage milieu participation. Provide medication management and maked adjustments as needed.  Will follow daily.    I certify that inpatient services furnished can reasonably be expected to improve the patient's condition.  COBOS, FERNANDO 09/15/2014, 2:06 PM

## 2014-09-15 NOTE — BHH Counselor (Signed)
Adult Comprehensive Assessment  Patient ID: Gary Osborn, male   DOB: April 06, 1933, 80 y.o.   MRN: [id number]  Information Source: Information source: Patient  Current Stressors:  Educational / Learning stressors: N/A Employment / Job issues: retired Forensic psychologist Family Relationships: N/A Museum/gallery curator / Lack of resources (include bankruptcy): Patient identifies finances as a Therapist, sports / Lack of housing: Patient reports that he and his wife have lived in a house for years but states that he did not want to talk about it Physical health (include injuries & life threatening diseases): Patient showed CSW some scratches on his shin and arm as a result of jumping [place] the lake Social relationships: N/A Substance abuse: N/A Bereavement / Loss: Patient shared that his daughter had died of a suicide attempt but did not elaborate  Living/Environment/Situation:  Living Arrangements: Spouse/significant other Living conditions (as described by patient or guardian): unknown- patient did not want to discuss How long has patient lived in current situation?: "years" What is atmosphere in current home: Other (Comment) (unknown)  Family History:  Marital status: Married Number of Years Married: 22 What types of issues is patient dealing with in the relationship?: unknown Additional relationship information: patient reports that wife is supportive Does patient have children?: Yes How many children?: 2 How is patient's relationship with their children?: gets along with son, patient reports that his daughter died of suicide  Childhood History:  By whom was/is the patient raised?: Both parents Additional childhood history information: N/A Description of patient's relationship with caregiver when they were a child: good Patient's description of current relationship with people who raised him/her: parents are deceased Does patient have siblings?: Yes Number of Siblings: 1 Description of  patient's current relationship with siblings: Patient reports that he gets along with his 1 sister Did patient suffer any verbal/emotional/physical/sexual abuse as a child?: No Did patient suffer from severe childhood neglect?: No Has patient ever been sexually abused/assaulted/raped as an adolescent or adult?: No Was the patient ever a victim of a crime or a disaster?: No Witnessed domestic violence?: No Has patient been effected by domestic violence as an adult?: No  Education:  Highest grade of school patient has completed: B.A. 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 Has patient ever been in the [employer]?: No Has patient ever served in Recruitment consultant?: No  Financial Resources:   Museum/gallery curator resources:  (income is from Radiation protection practitioner) Does patient have a Programmer, applications or guardian?: No  Alcohol/Substance Abuse:   What has been your use of drugs/alcohol within the last 12 months?: Denies If attempted suicide, did drugs/alcohol play a role in this?: No Alcohol/Substance Abuse Treatment Hx: Denies past history Has alcohol/substance abuse ever caused legal problems?: No  Social Support System:   Pensions consultant Support System: Fair Astronomer System: wife, sister Type of faith/religion: N/A How does patient's faith help to cope with current illness?: N/A  Leisure/Recreation:   Leisure and Hobbies: Patient reports that he does not enjoy things anymore and feels hopeless, unable to look into the future  Strengths/Needs:   What things does the patient do well?: Patient reports that he does not enjoy  things anymore and feels hopeless, unable to look into the future In what areas does patient struggle / problems for patient: Patient did not want to discuss  Discharge Plan:   Does patient have access to transportation?: Yes Will patient be returning to same living situation after discharge?:  (Patient reports that he is unsure, vague when asked about his plans after discharge) Currently receiving community mental health services: Yes (From Whom) (Dr. Casimiro Needle at [place]) If no, would patient like referral for services when discharged?: No Does patient have financial barriers related to discharge medications?: No  Summary/Recommendations:     Patient is a 80 year old Caucasian Male with a diagnosis of Major Depressive Disorder, Recurrent, Severe. Patient lives in [place] with his wife. Patient reports being hospitalized due to depression and a suicide attempt by drowning himself in a lake. Patient reports a prior attempt in January 2015 but did not elaborate. Patient appears to be worried about stressors that he did not wish to discuss with CSW. Patient was vague about his plans at discharge and states that he feels hopeless and that he has no future. Patient also states that he will not benefit from treatment here. CSW provided emotional support and encouragement. Patient will benefit from crisis stabilization, medication evaluation, group therapy, and psycho education in addition to case management for discharge planning. Patient and CSW reviewed pt's identified goals and treatment plan. Pt verbalized understanding and agreed to treatment plan.   Erianna Jolly, Casimiro Needle. 09/15/2014

## 2014-09-15 NOTE — Progress Notes (Signed)
D: Pt presents flat in affect and sad in mood. Pt mood brightens upon interaction. Writer and pt discussed the unit program in detail. Pt reports that he was doing fine at the moment with no complaints. Pt is currently denying any SI/HI/AVH. Pt complaint with his current plan of care.   A: Writer administered scheduled medications to pt, per MD orders. Continued support and availability as needed was extended to this pt. Staff continue to monitor pt with q7min checks.  R: No adverse drug reactions noted. Pt receptive to treatment. Pt remains safe at this time.

## 2014-09-15 NOTE — BHH Group Notes (Signed)
   [facility] LCSW Aftercare Discharge Planning Group Note  09/15/2014  8:45 AM   Participation Quality: Alert, Appropriate and Oriented  Mood/Affect: Depressed and Flat  Depression Rating: 10  Anxiety Rating: 10  Thoughts of Suicide: Pt denies SI/HI  Will you contract for safety? Yes  Current AVH: Pt denies  Plan for Discharge/Comments: Pt attended discharge planning group and actively participated in group. CSW provided pt with today's workbook. Patient reports "I'm here". Experiencing high levels of depression and anxiety, denies SI/HI today. He plans to return home to continue medication management services with Dr. Casimiro Needle at [place] at discharge.  Transportation Means: Pt reports access to transportation  Supports: No supports mentioned at this time  Tilden Fossa, MSW, Trainer Social Worker [employer] [phone]
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
## 2014-09-15 NOTE — BHH Suicide Risk Assessment (Signed)
[facility] INPATIENT:  Family/Significant Other Suicide Prevention Education  Suicide Prevention Education:  Patient Refusal for Family/Significant Other Suicide Prevention Education: The patient Gary Osborn has refused to provide written consent for family/significant other to be provided Family/Significant Other Suicide Prevention Education during admission and/or prior to discharge.  Physician notified. SPE reviewed with patient and brochure provided. Patient verbalized his understanding and had no further questions at this time. Patient declined for CSW to contact his wife or other family member.  Doye Montilla, Casimiro Needle 09/15/2014, 4:08 PM

## 2014-09-16 LAB — LIPID PANEL
Cholesterol: 138 mg/dL (ref 0–200)
HDL: 65 mg/dL (ref >39)
LDL Cholesterol: 63 mg/dL (ref 0–99)
Total CHOL/HDL Ratio: 2.1 RATIO
Triglycerides: 48 mg/dL (ref <150)
VLDL: 10 mg/dL (ref 0–40)

## 2014-09-16 LAB — T3: T3, Total: 71.7 ng/dl — ABNORMAL LOW (ref 80.0–204.0)

## 2014-09-16 LAB — T4, FREE: Free T4: 1.03 ng/dL (ref 0.80–1.80)

## 2014-09-16 LAB — HEMOGLOBIN A1C
HEMOGLOBIN A1C: 5.2 % (ref <5.7)
MEAN PLASMA GLUCOSE: 103 mg/dL (ref <117)

## 2014-09-16 NOTE — Progress Notes (Signed)
Psychoeducational Group Note  Date: 09/16/2014 Time:  1315 Group Topic/Focus:  Identifying Needs:   The focus of this group is to help patients identify their personal needs that have been historically problematic and identify healthy behaviors to address their needs.  Participation Level:  Active  Participation Quality:  Appropriate  Affect:  Appropriate  Cognitive:  Oriented  Insight:  Improving  Engagement in Group:  Engaged  Additional Comments:  Pt was quite attentive and participated in the group.  Paulino Rily

## 2014-09-16 NOTE — Progress Notes (Signed)
.  Psychoeducational Group Note    Date: 09/16/2014 Time:  0930    Goal Setting Purpose of Group: To be able to set a goal that is measurable and that can be accomplished in one day Participation Level:  Active  Participation Quality:  Appropriate  Affect:  Flat  Cognitive:  Oriented  Insight:  Improving  Engagement in Group:  Engaged  Additional Comments:  Pt attended the group and was engaged  Bryson Dames A

## 2014-09-16 NOTE — Plan of Care (Signed)
Problem: Diagnosis: Increased Risk For Suicide Attempt Goal: STG-Patient Will Comply With Medication Regime Outcome: Progressing Patient is compliant with scheduled medications.     

## 2014-09-16 NOTE — Progress Notes (Signed)
D) Pt. Attended the groups and sits out in the milieu but does not interact much with his peers. Pt states that he feels self conscious and very uncomfortable with the fact that he is here at the hospital and "I have to deal with all these people. I am use to being in my own house, and in my own space. Affect is flat and mood depressed. Pt rates his depression, hopelessness and anxiety all at a 10. Pt is quite worried about the fact that he might end up in jail. A) Pt given support, reassurance and praise. Encouraged Pt to sit and talk with this Probation officer when he was able.  R) Affect is flat, mood depressed. Pt reports that he is not feeling suicidal today "I haven't thought of it one time today".

## 2014-09-16 NOTE — Progress Notes (Signed)
[facility] MD Progress Note  09/16/2014 10:22 AM Gary Osborn  MRN:  [id number] Subjective:  Chart reviewed. Patient interviewed. Pt reports feeling "not too well, because things are getting complicated". Pt feels confused about the activities and requirements on the unit. Pt reports difficulty making decisions and answering questions in group. Pt reports tolerating meds well.   Diagnosis:   DSM5: Depressive Disorders:  Major Depressive Disorder - Severe (296.23) Total Time spent with patient: 30 minutes   ADL's:  Intact  Sleep: Good  Appetite:  Good  Suicidal Ideation: Passive suicidal ideation currently. Plan:  none Intent:  none Means:  none Homicidal Ideation:  none AEB (as evidenced by):  Psychiatric Specialty Exam: Physical Exam  ROS  Blood pressure 120/75, pulse 79, temperature 97.8 F (36.6 C), temperature source Oral, resp. rate 14, height 5' 8.75" (1.746 m), weight 76.204 kg (168 lb).Body mass index is 25 kg/(m^2).  General Appearance: Fairly Groomed  Engineer, water::  Fair  Speech:  Normal Rate  Volume:  Decreased  Mood:  Depressed  Affect:  Congruent and Depressed  Thought Process:  Goal Directed  Orientation:  Full (Time, Place, and Person)  Thought Content:  Negative  Suicidal Thoughts:  Yes.  without intent/plan  Homicidal Thoughts:  No  Memory:  Negative  Judgement:  Poor  Insight:  Shallow  Psychomotor Activity:  Decreased  Concentration:  Poor  Recall:  [place] of Knowledge:Fair  Language: Fair  Akathisia:  Negative  Handed:  Right  AIMS (if indicated):     Assets:  Communication Skills Desire for Improvement Financial Resources/Insurance  Sleep:  Number of Hours: 6.75   Musculoskeletal: Strength & Muscle Tone: within normal limits Gait & Station: normal Patient leans: N/A  Current Medications: Current Facility-Administered Medications  Medication Dose Route Frequency Provider Last Rate Last Dose  . acetaminophen (TYLENOL) tablet 650 mg   650 mg Oral Q6H PRN Shuvon Rankin, NP      . busPIRone (BUSPAR) tablet 10 mg  10 mg Oral TID Shuvon Rankin, NP   10 mg at 09/16/14 0857  . clonazePAM (KLONOPIN) tablet 0.5 mg  0.5 mg Oral BID Shuvon Rankin, NP   0.5 mg at 09/16/14 0856  . magnesium hydroxide (MILK OF MAGNESIA) suspension 30 mL  30 mL Oral Daily PRN Shuvon Rankin, NP      . mirtazapine (REMERON) tablet 7.5 mg  7.5 mg Oral QHS Myer Peer Cobos, MD   7.5 mg at 09/15/14 2156  . PARoxetine (PAXIL) tablet 40 mg  40 mg Oral QHS Shuvon Rankin, NP   40 mg at 09/15/14 2156  . pneumococcal 23 valent vaccine (PNU-IMMUNE) injection 0.5 mL  0.5 mL Intramuscular Tomorrow-1000 Fernando A Cobos, MD      . risperiDONE (RISPERDAL) tablet 0.5 mg  0.5 mg Oral q morning - 10a Myer Peer Cobos, MD   0.5 mg at 09/16/14 0857  . risperiDONE (RISPERDAL) tablet 1 mg  1 mg Oral QHS Jenne Campus, MD   1 mg at 09/15/14 2156  . traZODone (DESYREL) tablet 50 mg  50 mg Oral QHS PRN Shuvon Rankin, NP        Lab Results:  Results for orders placed or performed during the hospital encounter of 09/14/14 (from the past 48 hour(s))  Lipid panel     Status: None   Collection Time: 09/16/14  6:30 AM  Result Value Ref Range   Cholesterol 138 0 - 200 mg/dL   Triglycerides 48 <150 mg/dL  HDL 65 >39 mg/dL   Total CHOL/HDL Ratio 2.1 RATIO   VLDL 10 0 - 40 mg/dL   LDL Cholesterol 63 0 - 99 mg/dL    Comment:        Total Cholesterol/HDL:CHD Risk Coronary Heart Disease Risk Table                     Men   Women  1/2 Average Risk   3.4   3.3  Average Risk       5.0   4.4  2 X Average Risk   9.6   7.1  3 X Average Risk  23.4   11.0        Use the calculated Patient Ratio above and the CHD Risk Table to determine the patient's CHD Risk.        ATP III CLASSIFICATION (LDL):  <100     mg/dL   Optimal  100-129  mg/dL   Near or Above                    Optimal  130-159  mg/dL   Borderline  160-189  mg/dL   High  >190     mg/dL   Very High Performed at [facility]     Physical Findings: AIMS: Facial and Oral Movements Muscles of Facial Expression: None, normal Lips and Perioral Area: None, normal Jaw: None, normal Tongue: None, normal,Extremity Movements Upper (arms, wrists, hands, fingers): None, normal Lower (legs, knees, ankles, toes): None, normal, Trunk Movements Neck, shoulders, hips: None, normal, Overall Severity Severity of abnormal movements (highest score from questions above): None, normal Incapacitation due to abnormal movements: None, normal Patient's awareness of abnormal movements (rate only patient's report): No Awareness, Dental Status Current problems with teeth and/or dentures?: No Does patient usually wear dentures?: No  CIWA:    COWS:     Treatment Plan Summary: Daily contact with patient to assess and evaluate symptoms and progress in treatment Medication management Continue med changes made by Dr. Parke Poisson yesterday.  Plan:  Medical Decision Making Problem Points:  Established problem, stable/improving (1) Data Points:  Review of medication regiment & side effects (2)  I certify that inpatient services furnished can reasonably be expected to improve the patient's condition.   Dereck Leep 09/16/2014, 10:22 AM

## 2014-09-16 NOTE — Plan of Care (Signed)
Problem: Ineffective individual coping Goal: STG: Patient will remain free from self harm Outcome: Progressing Pt states the today he has not thought of hurting himself

## 2014-09-16 NOTE — B[facility] Group Notes (Signed)
[place] LCSW Group Therapy 09/16/2014 10:00am  Type of Therapy and Topic: Group Therapy: Avoiding Self-Sabotaging and Enabling Behaviors   Participation Level: Minimal   Description of Group:  Learn how to identify obstacles, self-sabotaging and enabling behaviors, what are they, why do we do them and what needs do these behaviors meet? Discuss unhealthy relationships and how to have positive healthy boundaries with those that sabotage and enable. Explore aspects of self-sabotage and enabling in yourself and how to limit these self-destructive behaviors in everyday life. A scaling question is used to help patient look at where they are now in their motivation to change, from 1 to 10 (lowest to highest motivation).   Therapeutic Goals:  1. Patient will identify one obstacle that relates to self-sabotage and enabling behaviors 2. Patient will identify one personal self-sabotaging or enabling behavior they did prior to admission 3. Patient able to establish a plan to change the above identified behavior they did prior to admission:  4. Patient will demonstrate ability to communicate their needs through discussion and/or role plays.  Summary of Patient Progress:  Pt missed majority of group discussion due to a visit with the psychiatrist. Pt responded when prompted and reported that he would like to be able to curb his anxiety with more medication as he feels the current dosage is not working. Pt was unable to articulate alternative coping strategies.   Therapeutic Modalities:  Cognitive Behavioral Therapy  Person-Centered Therapy  Motivational Interviewing   Peri Maris, [place] 09/16/2014 2:56 PM

## 2014-09-16 NOTE — Progress Notes (Signed)
Writer has observed patient up in the dayroom watching tv and no interaction with peers.Writer spoke with patient and he is not very talkative. He does admit to worrying about does not elaborate on what . He is compliant with his medications, reported that his wife visited on this evening. He denies si/hi/a/v hallucinations. Writer encouraged patient to feel free to talk with writer if needing to. Safety maintained on unit with 15 min checks.

## 2014-09-17 NOTE — [facility] Group Notes (Signed)
[facility] Group Notes:  Healthy Life Skills  Date:  09/17/2014  Time:  2:57 PM  Type of Therapy:  Nurse Education  Participation Level:  Did Not Attend  Participation Quality:  Inattentive  Affect:  Flat  Cognitive:  Lacking  Insight:  None  Engagement in Group:  None  Modes of Intervention:  Discussion  Summary of Progress/Problems:  Gary Osborn 09/17/2014, 2:57 PM

## 2014-09-17 NOTE — Progress Notes (Signed)
Patient has been up but very little interaction with peers. He has been compliant with his medications and reports that he feels that they are working becuase he used to be to be irritable and now not as much. He reports not being sure when he will get out of here. He denies si/hi/a/v hallucinations. Support and encouragement given, safety maintained on unit with 15 min checks.

## 2014-09-17 NOTE — Progress Notes (Signed)
[facility] MD Progress Note  09/17/2014 11:47 AM Gary Osborn  MRN:  [id number] Subjective:  Chart reviewed. Patient interviewed. Pt reports mild constipation (chronic) this AM, but does not want to take any stool softeners or laxatives at this time "because I don't think that will help". Pt reports feeling "still low". Pt feels more confused (than yesterday) about the activities and requirements on the unit. Pt reports difficulty making decisions and answering questions in group, because "things are more complicated here". Pt reports tolerating meds well, and does not feel the constipation is a side effect of risperdal.   Diagnosis:   DSM5: Depressive Disorders:  Major Depressive Disorder - Severe (296.23) Total Time spent with patient: 30 minutes   ADL's:  Intact  Sleep: Good  Appetite:  Good  Suicidal Ideation: "I still don't have the desire to live".  Plan:  none Intent:  none Means:  none Homicidal Ideation:  none AEB (as evidenced by):  Psychiatric Specialty Exam: Physical Exam  ROS  Blood pressure 118/70, pulse 87, temperature 99.9 F (37.7 C), temperature source Oral, resp. rate 16, height 5' 8.75" (1.746 m), weight 76.204 kg (168 lb).Body mass index is 25 kg/(m^2).  General Appearance: Fairly Groomed  Engineer, water::  Fair  Speech:  Normal Rate  Volume:  Decreased  Mood:  Depressed  Affect:  Congruent and Depressed  Thought Process:  Goal Directed  Orientation:  Full (Time, Place, and Person)  Thought Content:  Negative  Suicidal Thoughts:  Yes.  without intent/plan  Homicidal Thoughts:  No  Memory:  Negative  Judgement:  Poor  Insight:  Shallow  Psychomotor Activity:  Decreased  Concentration:  Poor  Recall:  [place] of Knowledge:Fair  Language: Fair  Akathisia:  Negative  Handed:  Right  AIMS (if indicated):     Assets:  Communication Skills Desire for Improvement Financial Resources/Insurance  Sleep:  Number of Hours: 6.75   Musculoskeletal: Strength &  Muscle Tone: within normal limits Gait & Station: normal Patient leans: N/A  Current Medications: Current Facility-Administered Medications  Medication Dose Route Frequency Provider Last Rate Last Dose  . acetaminophen (TYLENOL) tablet 650 mg  650 mg Oral Q6H PRN Shuvon Rankin, NP      . busPIRone (BUSPAR) tablet 10 mg  10 mg Oral TID Shuvon Rankin, NP   10 mg at 09/17/14 1059  . clonazePAM (KLONOPIN) tablet 0.5 mg  0.5 mg Oral BID Shuvon Rankin, NP   0.5 mg at 09/17/14 0844  . magnesium hydroxide (MILK OF MAGNESIA) suspension 30 mL  30 mL Oral Daily PRN Shuvon Rankin, NP      . mirtazapine (REMERON) tablet 7.5 mg  7.5 mg Oral QHS Myer Peer Cobos, MD   7.5 mg at 09/16/14 2116  . PARoxetine (PAXIL) tablet 40 mg  40 mg Oral QHS Shuvon Rankin, NP   40 mg at 09/16/14 2116  . pneumococcal 23 valent vaccine (PNU-IMMUNE) injection 0.5 mL  0.5 mL Intramuscular Tomorrow-1000 Myer Peer Cobos, MD   0.5 mL at 09/16/14 1519  . risperiDONE (RISPERDAL) tablet 0.5 mg  0.5 mg Oral q morning - 10a Myer Peer Cobos, MD   0.5 mg at 09/17/14 1059  . risperiDONE (RISPERDAL) tablet 1 mg  1 mg Oral QHS Jenne Campus, MD   1 mg at 09/16/14 2116  . traZODone (DESYREL) tablet 50 mg  50 mg Oral QHS PRN Shuvon Rankin, NP        Lab Results:  Results for orders placed  or performed during the hospital encounter of 09/14/14 (from the past 48 hour(s))  Hemoglobin A1c     Status: None   Collection Time: 09/16/14  6:30 AM  Result Value Ref Range   Hgb A1c MFr Bld 5.2 <5.7 %    Comment: (NOTE)                                                                       According to the ADA Clinical Practice Recommendations for 2011, when HbA1c is used as a screening test:  >=6.5%   Diagnostic of Diabetes Mellitus           (if abnormal result is confirmed) 5.7-6.4%   Increased risk of developing Diabetes Mellitus References:Diagnosis and Classification of Diabetes Mellitus,Diabetes OJJK,0938,18(EXHBZ 1):S62-S69 and  Standards of Medical Care in         Diabetes - 2011,Diabetes Care,2011,34 (Suppl 1):S11-S61.    Mean Plasma Glucose 103 <117 mg/dL    Comment: Performed at [facility]  Lipid panel     Status: None   Collection Time: 09/16/14  6:30 AM  Result Value Ref Range   Cholesterol 138 0 - 200 mg/dL   Triglycerides 48 <150 mg/dL   HDL 65 >39 mg/dL   Total CHOL/HDL Ratio 2.1 RATIO   VLDL 10 0 - 40 mg/dL   LDL Cholesterol 63 0 - 99 mg/dL    Comment:        Total Cholesterol/HDL:CHD Risk Coronary Heart Disease Risk Table                     Men   Women  1/2 Average Risk   3.4   3.3  Average Risk       5.0   4.4  2 X Average Risk   9.6   7.1  3 X Average Risk  23.4   11.0        Use the calculated Patient Ratio above and the CHD Risk Table to determine the patient's CHD Risk.        ATP III CLASSIFICATION (LDL):  <100     mg/dL   Optimal  100-129  mg/dL   Near or Above                    Optimal  130-159  mg/dL   Borderline  160-189  mg/dL   High  >190     mg/dL   Very High Performed at [facility]   T3     Status: Abnormal   Collection Time: 09/16/14  6:30 AM  Result Value Ref Range   T3, Total 71.7 (L) 80.0 - 204.0 ng/dl    Comment: Performed at [facility]  T4, free     Status: None   Collection Time: 09/16/14  6:30 AM  Result Value Ref Range   Free T4 1.03 0.80 - 1.80 ng/dL    Comment: Performed at [facility]    Physical Findings: AIMS: Facial and Oral Movements Muscles of Facial Expression: None, normal Lips and Perioral Area: None, normal Jaw: None, normal Tongue: None, normal,Extremity Movements Upper (arms, wrists, hands, fingers): None, normal Lower (legs, knees, ankles, toes): None, normal, Trunk Movements Neck, shoulders,  hips: None, normal, Overall Severity Severity of abnormal movements (highest score from questions above): None, normal Incapacitation due to abnormal movements: None, normal Patient's awareness of abnormal  movements (rate only patient's report): No Awareness, Dental Status Current problems with teeth and/or dentures?: No Does patient usually wear dentures?: No  CIWA:    COWS:     Treatment Plan Summary: Daily contact with patient to assess and evaluate symptoms and progress in treatment Medication management Continue med changes as per previous plan.  Plan:  Medical Decision Making Problem Points:  Established problem, stable/improving (1) Data Points:  Review of medication regiment & side effects (2)  I certify that inpatient services furnished can reasonably be expected to improve the patient's condition.   Dereck Leep 09/17/2014, 11:47 AM

## 2014-09-17 NOTE — [facility] Group Notes (Signed)
[place] Group Notes:  relaxation  Date:  09/17/2014  Time:  11:53 AM  Type of Therapy:  Nurse Education  Participation Level:  Active  Participation Quality:  Appropriate  Affect:  Appropriate  Cognitive:  Appropriate  Insight:  Appropriate  Engagement in Group:  Engaged  Modes of Intervention:  Discussion  Summary of Progress/Problems:  Gary Osborn 09/17/2014, 11:53 AM

## 2014-09-17 NOTE — Plan of Care (Signed)
Problem: Ineffective individual coping Goal: STG: Patient will remain free from self harm Outcome: Progressing Patient has remained free from self harm and 15 minute checks in progress.

## 2014-09-17 NOTE — [facility] Group Notes (Signed)
[place] LCSW Group Therapy 09/17/2014 10:00am  Type of Therapy: Group Therapy- Feelings Around Discharge & Establishing a Supportive Framework  Participation Level: Active   Modes of Intervention: Clarification, Confrontation, Discussion, Education, Exploration, Limit-setting, Orientation, Problem-solving, Rapport Building, Art therapist, Socialization and Support   Description of Group:   What is a supportive framework? What does it look like feel like and how do I discern it from and unhealthy non-supportive network? Learn how to cope when supports are not helpful and don't support you. Discuss what to do when your family/friends are not supportive.  Summary of Patient Progress Pt participated minimally in group, responding only when prompted. Pt reports that feeling connected to someone is an important characteristic of a positive support. Pt's thought processes were somewhat illogical.   Therapeutic Modalities:   Cognitive Behavioral Therapy Person-Centered Therapy Motivational Interviewing   Peri Maris, [place] 09/17/2014 11:07 AM

## 2014-09-17 NOTE — Progress Notes (Signed)
Patient attended [place] meeting. Tonight we had a speaker meeting. They actively listened. Despite being reserved and quiet, he hung out around his peers in the dayroom for most of the evening. He is cooperative and appropriate.   Gary Osborn A

## 2014-09-17 NOTE — Progress Notes (Signed)
D) Pt sits in the dayroom, attends the groups, watches the TV, but tends not to be a part of the general Milieu. Keeps to himself. States "I feel uncomfortable in this situation with all these people around. I am use to being in my own home with my own things. Pt is controled in his responses and does not allow for emotional responses.  Stays concrete in his way of dealing with his life. States he has low energy and rates his depression, hopelessness, and anxiety all at a 10. Denies SI today. States, I haven't thought of hurting myself to day.  A) Given support and not pushed too much. Encouraged to participate and to be a part of the milieu. R) States "feels like the medication that I am taking is working some".

## 2014-09-17 NOTE — Progress Notes (Signed)
Writer spoke with patient 1:1 and he describes his day as being so-so. He visited with his wife briefly, attended group tis evening and is compliant with his medications. Patient has flat blunted affect and very little conversation for others. He sits in the dayroom and listens to other patients conversations but does not join in or initiate conversations. Patient denies si/hi/a/v hallucinations. Support and encouragement given, safety maintained on unit with 15 min checks.

## 2014-09-18 DIAGNOSIS — R45851 Suicidal ideations: Secondary | ICD-10-CM

## 2014-09-18 MED ORDER — MIRTAZAPINE 15 MG PO TABS
15.0000 mg | ORAL_TABLET | Freq: Every day | ORAL | Status: DC
  Administered 2014-09-18 – 2014-09-19 (×2): 15 mg via ORAL
  Filled 2014-09-18 (×3): qty 1

## 2014-09-18 MED ORDER — RISPERIDONE 1 MG PO TABS
1.0000 mg | ORAL_TABLET | Freq: Every morning | ORAL | Status: DC
  Administered 2014-09-19 – 2014-09-26 (×8): 1 mg via ORAL
  Filled 2014-09-18 (×12): qty 1

## 2014-09-18 NOTE — Progress Notes (Signed)
D: Patient in his room sitting in the dark.  Patient smiling but states he had a bad day.  Patient states he is dealing with some personal things but will not elaborate.  Patient did states he cannot deal with those things while he is her.  Patient appears to be superficial.  Patient states he has been attending all groups and stated, "I have attended more than I have wanted to.  Patient states his goal for today was to get better.  Patient states he is still working towards it.  Patient denies SI/HI and denies AVH. A: Staff to monitor Q 15 mins for safety.  Encouragement and support offered.  Scheduled medications administered per orders. R: Patient remains safe on the unit.  Patient attended group tonight.  Patient visible on the unit and not interacting with peers.  Patient taking administered medications.

## 2014-09-18 NOTE — Progress Notes (Signed)
Pt has been up for all groups today.  He seems apprehensive today but has taken all his medications as prescribed.  His risperdal will increase tomorrow to 1 mg at 1000  and his remeron will increase to 15mg  tonight. He rated all his depression hopelessness and anxiety a 10 on his self-inventory.  He denied any S/H ideation or A/V/H. He did c/o some bowel issues and urinary issues informed Roselyn Meier NP and Aggie NP both aware. 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  Pt then stated,"I remember that I was just hoping maybe something different would show up here. He is unclear of his discharge plans. He did write on his self-inventory that "discharge not anticipated soon"

## 2014-09-18 NOTE — [facility] Group Notes (Signed)
[facility] LCSW Aftercare Discharge Planning Group Note   09/18/2014 10:46 AM    Participation Quality:  Appropraite  Mood/Affect:  Appropriate  Depression Rating:  10  Anxiety Rating:  10  Thoughts of Suicide:  No  Will you contract for safety?   NA  Current AVH:  No  Plan for Discharge/Comments:  Patient attended discharge planning group and actively participated in group. He will discharge home and follow up with Dr. Casimiro Needle at [place].  Suicide prevention education reviewed and SPE document provided.   Transportation Means: Patient has transportation.   Supports:  Patient has a support system.   TRUE [place], [place]

## 2014-09-18 NOTE — Plan of Care (Signed)
Problem: Ineffective individual coping Goal: STG: Patient will remain free from self harm Outcome: Progressing Patient has remained free from harm.

## 2014-09-18 NOTE — [facility] Group Notes (Signed)
[place] LCSW Group Therapy                 Overcoming Obstacles              1:15 -2:30         09/18/2014   4:04 PM     Type of Therapy:  Group Therapy  Participation Level:  Appropriate  Participation Quality:  Appropriate  Affect:  Depressed, Flat  Cognitive: Appropriate  Insight: Developing/Improving Engaged  Engagement in Therapy: Developing/Imprvoing Engaged  Modes of Intervention:  Discussion Exploration  Education Rapport BuildingProblem-Solving Support  Summary of Progress/Problems:  The main focus of today's group was overcoming obstacles.  Patient shared he obstacle he has to overcome is the problem that lead to his admission and is waiting for him once he gets home.  Patient able to identify appropriate coping skills including trying to see the problem from a difference perspective.Gary Osborn 09/18/2014   4:04 PM

## 2014-09-18 NOTE — Plan of Care (Signed)
Problem: Diagnosis: Increased Risk For Suicide Attempt Goal: STG-Patient Will Report Suicidal Feelings to Staff Outcome: Progressing Pt denies any S/I on his self-inventory.  He denies any plan here in the hospital and contracts verbally to come to staff before acting on any self-harm thoughts.

## 2014-09-18 NOTE — Progress Notes (Addendum)
Patient ID: Gary Osborn, male   DOB: 10-08-1933, 80 y.o.   MRN: [id number] [facility] MD Progress Note  09/18/2014 2:40 PM Gary Osborn  MRN:  [id number] Subjective: Patient states he is "not doing well". He continues to ruminate- he  Reports  that he is concerned he will be incarcerated and will loose all his assets as of April 2016, once he files his tax return. He continues to ruminate about this, and states that because he did not declare an annuity to his accountant earlier, he will  " end up in jail, loosing all that I have, and also become  homeless".  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     Objective:  I have discussed case with treatment team. Patient presents with some modest  improvement, to include a slightly improved range of affect ( smiles at times appropriately) ,and being better related and somewhat more conversant than upon admission. For example, today spoke appropriately/ briefly  about friends he has from [place] after finding out that this Probation officer is originally from there. He does, however, continue to have significant negative, pessimistic ruminations,  As described above. His intense  rumination that having failed to inform his accountant of an annuity will result in incarceration, homelessness, and loss of all assets , as well as  his fear about being incarcerated partly due to a perception of having a small penis is suggestive of  A psychotic element to his presentation. On unit he is calm, polite, going to groups , but interacting little with others. Denies any side effects. At this time denies any suicidal ideations and contracts for safety on unit.  With patient's  consent I made efforts to reach wife, who came to visit him over weekend, in order to obtain collateral information and her perspective on how much improvement there has been - no answer on phone number provided. Patient responds fairly well to support and empathy.    Diagnosis:   DSM5: Depressive Disorders:  Major Depressive Disorder - Severe (296.23) Total Time spent with patient: 30 minutes   ADL's: fair   Sleep: fair   Appetite:  Good  Suicidal Ideation:  At present patient denies any suicidal ideations  Homicidal Ideation:  none AEB (as evidenced by):  Psychiatric Specialty Exam: Physical Exam  Review of Systems  Constitutional: Negative for fever and chills.  Respiratory: Negative for cough and wheezing.   Cardiovascular: Negative for chest pain.  Gastrointestinal: Positive for constipation. Negative for vomiting, abdominal pain and blood in stool.  Skin: Negative for rash.  Neurological: Negative for seizures, loss of consciousness and headaches.  Psychiatric/Behavioral: Positive for depression.    Blood pressure 115/69, pulse 86, temperature 98.3 F (36.8 C), temperature source Oral, resp. rate 16, height 5' 8.75" (1.746 m), weight 76.204 kg (168 lb).Body mass index is 25 kg/(m^2).  General Appearance: improved grooming   Eye Contact::  Fair  Speech:  Normal Rate  Volume:  Decreased  Mood:  Depressed  Affect:  constricted, but does smile briefly at times  Thought Process:  Goal Directed  Orientation:  Full (Time, Place, and Person)  Thought Content:  denies hallucinations, no delusions, ruminative about perceived stressors as above, and also somatically preoccupied, particularly issues regarding bowel function  Suicidal Thoughts:  Yes.  without intent/plan  Homicidal Thoughts:  No  Memory:  Negative- of note, scored 26/30 on MMSE today. Had most difficulty in spelling 5 letter word backwards   Judgement:  Fair  Insight:  Shallow  Psychomotor Activity:   Decreased  Concentration:  Good  Recall:  Good  Fund of Knowledge:Good  Language: Good  Akathisia:  Negative  Handed:  Right  AIMS (if indicated):     Assets:  Communication Skills Desire for Improvement Financial Resources/Insurance  Sleep:  Number of Hours: 5   Musculoskeletal: Strength & Muscle Tone: within normal limits Gait & Station: normal Patient leans: N/A  Current Medications: Current Facility-Administered Medications  Medication Dose Route Frequency Provider Last Rate Last Dose  . acetaminophen (TYLENOL) tablet 650 mg  650 mg Oral Q6H PRN Shuvon Rankin, NP      . busPIRone (BUSPAR) tablet 10 mg  10 mg Oral TID Shuvon Rankin, NP   10 mg at 09/18/14 1156  . clonazePAM (KLONOPIN) tablet 0.5 mg  0.5 mg Oral BID Shuvon Rankin, NP   0.5 mg at 09/18/14 0808  . magnesium hydroxide (MILK OF MAGNESIA) suspension 30 mL  30 mL Oral Daily PRN Shuvon Rankin, NP      . mirtazapine (REMERON) tablet 15 mg  15 mg Oral QHS Fernando A Cobos, MD      . PARoxetine (PAXIL) tablet 40 mg  40 mg Oral QHS Shuvon Rankin, NP   40 mg at 09/17/14 2136  . pneumococcal 23 valent vaccine (PNU-IMMUNE) injection 0.5 mL  0.5 mL Intramuscular Tomorrow-1000 Myer Peer Cobos, MD   0.5 mL at 09/16/14 1519  . risperiDONE (RISPERDAL) tablet 1 mg  1 mg Oral QHS Jenne Campus, MD   1 mg at 09/17/14 2137  . [START ON 09/19/2014] risperiDONE (RISPERDAL) tablet 1 mg  1 mg Oral q morning - 10a Myer Peer Cobos, MD      . traZODone (DESYREL) tablet 50 mg  50 mg Oral QHS PRN Shuvon Rankin, NP        Lab Results:  No results found for this or any previous visit (from the past 48 hour(s)).  Physical Findings: AIMS: Facial and Oral Movements Muscles of Facial Expression: None, normal Lips and Perioral Area: None, normal Jaw: None, normal Tongue: None, normal,Extremity Movements Upper (arms, wrists, hands, fingers): None, normal Lower (legs, knees, ankles, toes): None, normal, Trunk Movements Neck, shoulders,  hips: None, normal, Overall Severity Severity of abnormal movements (highest score from questions above): None, normal Incapacitation due to abnormal movements: None, normal Patient's awareness of abnormal movements (rate only patient's report): No Awareness, Dental Status Current problems with teeth and/or dentures?: No Does patient usually wear dentures?: No  CIWA:    COWS:      Assessment: Patient remains depressed , ruminative, somatically focused, particularly about bowel function issues, and concerns about the size of his genitalia. He is , however, demonstrating some improvement in range of affect, and is somewhat better related and more communicative at this time. Currently denies suicidal ideations. Tolerating medications well thus far.   Treatment Plan Summary: Daily contact with patient to assess and evaluate symptoms and progress in treatment Medication management  Plan: Continue inpatient treatment and support  Increase Risperidone to 1 mgr BID Increase Remeron to  15 mgrs QHS Continue Paxil 40 mgrs QHS  Continue Buspar 10 mgrs  TID Continue Klonopin 0.5 mgrs BID Patient offered laxative but declined.    Medical Decision Making Problem Points:  Established problem, stable/improving (1), Review of last therapy session (1) and Review of psycho-social stressors (1) Data Points:  Review of medication regiment & side effects (2) Review of new medications or change in dosage (2)  I certify that inpatient services furnished can reasonably be expected to improve the patient's condition.   COBOS, FERNANDO 09/18/2014, 2:40 PM

## 2014-09-18 NOTE — BHH Group Notes (Signed)
Adult Psychoeducational Group Note  Date:  09/18/2014 Time:  11:56 PM  Group Topic/Focus:  Wrap-Up Group:   The focus of this group is to help patients review their daily goal of treatment and discuss progress on daily workbooks.  Participation Level:  Minimal  Participation Quality:  Appropriate  Affect:  Flat  Cognitive:  Alert  Insight: Limited  Engagement in Group:  Limited  Modes of Intervention:  Discussion  Additional Comments:  Gary Osborn expressed that his day was not good.  He stated he doesn't have a discharge plan anytime soon.  His goal was to "try to make it a better day".  He expressed that his coping skill was eating.  Gary Osborn A 09/18/2014, 11:56 PM

## 2014-09-19 DIAGNOSIS — F333 Major depressive disorder, recurrent, severe with psychotic symptoms: Principal | ICD-10-CM

## 2014-09-19 MED ORDER — CLONAZEPAM 0.5 MG PO TABS
0.5000 mg | ORAL_TABLET | Freq: Three times a day (TID) | ORAL | Status: DC
  Administered 2014-09-19 – 2014-09-28 (×27): 0.5 mg via ORAL
  Filled 2014-09-19 (×26): qty 1

## 2014-09-19 NOTE — Tx Team (Signed)
Interdisciplinary Treatment Plan Update   Date Reviewed:  09/19/2014  Time Reviewed:  9:08 AM  Progress in Treatment:   Attending groups: Yes, patient is attending group Participating in groups: Patient engages in therapy Taking medication as prescribed: Yes  Tolerating medication: Yes Family/Significant other contact made:  No, patient declines collateral contact Patient understands diagnosis: Yes, patient understands diagnosis and need for treatment. Discussing patient identified problems/goals with staff: Yes, patient is able to express goals for treatment and discharge. Medical problems stabilized or resolved: Yes Denies suicidal/homicidal ideation: Yes Patient has not harmed self or others: Yes  For review of initial/current patient goals, please see plan of care.  Estimated Length of Stay:  3-5 days  Reasons for Continued Hospitalization:  Anxiety Depression Medication stabilization   New Problems/Goals identified:    Discharge Plan or Barriers:   Home with outpatient follow up with [facility]  Additional Comments:  Continue medication stabilization  Patient and CSW reviewed patient's identified goals and treatment plan.  Patient verbalized understanding and agreed to treatment plan.   Attendees:  Patient:  09/19/2014 9:08 AM   Signature:  Gabriel Earing, MD 09/19/2014 9:08 AM  Signature:  Carlton Adam, MD 09/19/2014 9:08 AM  Signature:  Agustina Caroli, NP  09/19/2014 9:08 AM  Signature:  Grayland Ormond, RN 09/19/2014 9:08 AM  Signature:   09/19/2014 9:08 AM  Signature:  Joette Catching, LCSW 09/19/2014 9:08 AM  Signature:  Elmarie Shiley, NP  09/19/2014 9:08 AM  Signature:   09/19/2014 9:08 AM  Signature:   09/19/2014 9:08 AM  Signature: 09/19/2014  9:08 AM  Signature:   Lars Pinks, RN URCM 09/19/2014  9:08 AM  Signature:   09/19/2014  9:08 AM    Scribe for Treatment Team:   Joette Catching,  09/19/2014 9:08 AM

## 2014-09-19 NOTE — Progress Notes (Signed)
Patient ID: Gary Osborn, male   DOB: 12-Aug-1933, 80 y.o.   MRN: [id number] [facility] 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 To a lesser degree he also ruminates about having written a book about [place] and this book possibly causing some people to single him or family members out. He does seem less focused on bowel habit issues, and less somatic overall. Denies any side effects.    Objective:  I have discussed case with treatment team.  As above , patient continues to experience anxious ruminations. Mood seems partially improved, and he does smile at times appropriately. He has also been going to groups and has been a little  interactive in milieu. No behavioral issues on unit. He is tolerating medications well- does not endorse side effects At this time denies any suicidal plan or intention and contracts for safety on unit.  With patient's consent I spoke with his wife via phone. She has been visiting regularly- she states that he seems only modestly  Improved. She states he has had recurrent episodes of depression and anxiety similar to present one, with previous admissions, as recently as April 2015. She does feel that  ECT was helpful for him ( April 2015 in [facility] ) and did not cause side effects. Patient is unsure- states " I don't know if it helped. It did make me more forgetful".  Patient responds partially to review of his ego strengths, reality testing .   Diagnosis:  MDD with psychotic features.   Total Time spent with patient: 30 minutes   ADL's: fair   Sleep: Good   Appetite:  Good  Suicidal Ideation:  At present patient denies any suicidal  Plan or intention and contracts  for safety  Homicidal Ideation:  none AEB (as evidenced by):  Psychiatric Specialty Exam: Physical Exam  Review of Systems  Constitutional: Negative for fever and chills.  Respiratory: Negative for cough and wheezing.   Cardiovascular: Negative for chest pain.  Gastrointestinal: Positive for constipation. Negative for vomiting, abdominal pain and blood in stool.  Skin: Negative for rash.  Neurological: Negative for seizures, loss of consciousness and headaches.  Psychiatric/Behavioral: Positive for depression.    Blood pressure 101/59, pulse 106, temperature 98.4 F (36.9 C), temperature source Oral, resp. rate 16, height 5' 8.75" (1.746 m), weight 76.204 kg (168 lb).Body mass index is 25 kg/(m^2).  General Appearance: improved grooming   Eye Contact::  Good  Speech:  Normal Rate  Volume:  Normal  Mood:  Depressed- Anxious   Affect:  anxious. Does smile at times  Thought Process:  Goal Directed  Orientation:  Full (Time, Place, and Person)  Thought Content:  Rumination and as above. Denies hallucinations and does not appear internally preoccupied   Suicidal Thoughts:  Yes.  without intent/plan  Homicidal Thoughts:  No  Memory:  Negative- of note, scored 26/30 on MMSE today. Had most difficulty in spelling 5 letter word backwards   Judgement:  Fair  Insight:  Shallow  Psychomotor Activity:  Normal  Concentration:  Good  Recall:  Good  Fund of Knowledge:Good  Language: Good  Akathisia:  Negative  Handed:  Right  AIMS (if indicated):     Assets:  Communication Skills Desire for Improvement Financial Resources/Insurance  Sleep:  Number of Hours: 6.75   Musculoskeletal: Strength & Muscle Tone: within normal limits Gait & Station: normal Patient leans: N/A  Current Medications: Current Facility-Administered Medications  Medication Dose Route Frequency Provider Last Rate Last Dose  . acetaminophen (TYLENOL) tablet 650 mg  650 mg Oral Q6H PRN Shuvon Rankin, NP      .  busPIRone (BUSPAR) tablet 10 mg  10 mg Oral TID Shuvon Rankin, NP   10 mg at 09/19/14 0804  . clonazePAM (KLONOPIN) tablet 0.5 mg  0.5 mg Oral BID Shuvon Rankin, NP   0.5 mg at 09/19/14 0804  . magnesium hydroxide (MILK OF MAGNESIA) suspension 30 mL  30 mL Oral Daily PRN Shuvon Rankin, NP      . mirtazapine (REMERON) tablet 15 mg  15 mg Oral QHS Jenne Campus, MD   15 mg at 09/18/14 2128  . PARoxetine (PAXIL) tablet 40 mg  40 mg Oral QHS Shuvon Rankin, NP   40 mg at 09/18/14 2127  . pneumococcal 23 valent vaccine (PNU-IMMUNE) injection 0.5 mL  0.5 mL Intramuscular Tomorrow-1000 Myer Peer Cobos, MD   0.5 mL at 09/16/14 1519  . risperiDONE (RISPERDAL) tablet 1 mg  1 mg Oral QHS Jenne Campus, MD   1 mg at 09/18/14 2127  . risperiDONE (RISPERDAL) tablet 1 mg  1 mg Oral q morning - 10a Jenne Campus, MD   1 mg at 09/19/14 1051  . traZODone (DESYREL) tablet 50 mg  50 mg Oral QHS PRN Shuvon Rankin, NP        Lab Results:  No results found for this or any previous visit (from the past 48 hour(s)).  Physical Findings: AIMS: Facial and Oral Movements Muscles of Facial Expression: None, normal Lips and Perioral Area: None, normal Jaw: None, normal Tongue: None, normal,Extremity Movements Upper (arms, wrists, hands, fingers): None, normal Lower (legs, knees, ankles, toes): None, normal, Trunk Movements Neck, shoulders, hips: None, normal, Overall Severity Severity of abnormal movements (highest score from questions above): None, normal Incapacitation due to abnormal movements: None, normal Patient's awareness of abnormal movements (rate only patient's report): No Awareness, Dental Status Current problems with teeth and/or dentures?: No Does patient usually wear dentures?: No  CIWA:    COWS:      Assessment: Patient remains depressed, although has a somewhat improved eye contact, range of affect. He continues to have ongoing anxious ruminations, with limited reality testing. He is  tolerating medications well. He denies side effects. As per wife , patient improved with ECT course earlier this year, but patient reports he had some memory loss from it. Although has not had medication side effects, based on age, would rather titrate medications gradually to minimize side effects. Remeron and Risperidone were increased yesterday. Today will increase Klonopin slightly.   Treatment Plan Summary: Daily contact with patient to assess and evaluate symptoms and progress in treatment Medication management  Plan: Continue inpatient treatment and support  Continue Risperidone  1 mgr BID Continue Remeron  15 mgrs QHS Continue Paxil 40 mgrs QHS  Continue Buspar 10 mgrs TID Increase Klonopin 0.5 mgrs TID    Medical Decision Making Problem Points:  Established problem, stable/improving (1), Review of last therapy session (1) and Review of psycho-social stressors (1) Data Points:  Review and summation of old records (2) Review of medication regiment & side effects (2) Review of new medications or  change in dosage (2)  I certify that inpatient services furnished can reasonably be expected to improve the patient's condition.   COBOS, FERNANDO 09/19/2014, 11:49 AM

## 2014-09-19 NOTE — Progress Notes (Signed)
Patient ID: DURANTE VIOLETT, male   DOB: 1933/10/13, 80 y.o.   MRN: [id number] PER STATE REGULATIONS 482.30  THIS CHART WAS REVIEWED FOR MEDICAL NECESSITY WITH RESPECT TO THE PATIENT'S ADMISSION/ DURATION OF STAY.  NEXT REVIEW DATE: 09/21/2014  Chauncy Lean, RN, BSN CASE MANAGER

## 2014-09-19 NOTE — Progress Notes (Signed)
Recreation Therapy Notes  Animal-Assisted Activity/Therapy (AAA/T) Program Checklist/Progress Notes Patient Eligibility Criteria Checklist & Daily Group note for Rec Tx Intervention  Date: 12.08.2015 Time: 2:45pm  Location: [address]    AAA/T Program Assumption of Risk Form signed by Patient/ or Parent Legal Guardian yes  Patient is free of allergies or sever asthma yes  Patient reports no fear of animals yes  Patient reports no history of cruelty to animals yes  Patient understands his/her participation is voluntary yes  Patient washes hands before animal contact yes  Patient washes hands after animal contact yes  Behavioral Response: Did not attend.   Laureen Ochs Cherrelle Plante, LRT/CTRS  Bradlee Bridgers L 09/19/2014 4:08 PM

## 2014-09-19 NOTE — BHH Group Notes (Signed)
Adult Psychoeducational Group Note  Date:  09/19/2014 Time:  10:10 PM  Group Topic/Focus:  AA Meeting  Participation Level:  None  Participation Quality:  Drowsy  Affect:  Flat  Cognitive:  Lacking  Insight: None  Engagement in Group:  Lacking  Modes of Intervention:  Discussion and Education  Additional Comments:  Tray attended group.  Gary Osborn A 09/19/2014, 10:10 PM

## 2014-09-19 NOTE — Progress Notes (Signed)
D: Patient denies SI/HI and A/V hallucinations; patient reports sleep is good; reports appetite is good; reports energy level is normal ; reports ability to concentrate is good; rates depression as 5/10; rates hopelessness 5/10; rates anxiety as 5/10; patient has complaints about his anxiety and says that it can be at 10/10  A: Monitored q 15 minutes; patient encouraged to attend groups; patient educated about medications; patient given medications per physician orders; patient encouraged to express feelings and/or concerns  R: Patient is cooperative; patient forwards little and conversation can be superficial; patient's interaction with staff and peers is minimal; patient was able to set goal to talk with staff 1:1 when having feelings of SI; patient is taking medications as prescribed and tolerating medications

## 2014-09-19 NOTE — Progress Notes (Signed)
Chaplain rounding on pt in response to spiritual care consult.  Pt seated in room on 300 hall.  Pt welcoming of chaplain presence and thanked chaplain.  Communicated that he has no needs at time, as he is "waiting on his nurse."  In effort to clarify what Mr. Joubert is waiting for, he was not specific.  Chaplain introduced spiritual care as resource and used brief time with patient to build therapeutic rapport.  Will continue to follow for support.   [place], [place]

## 2014-09-19 NOTE — Progress Notes (Signed)
D:Patient in his room on approach.  Patient states he did not have a good day.  Patient states his depression is a 10 today.  Patient states he and his wife had a good visit today but he states they are not on the same page.  Patient appears superficial and will not elaborate aboutt problems with writer Patient denies SI/HI and denies AVH. A: Staff to monitor Q 15 mins for safety.  Encouragement and support offered.  Scheduled medications administered per orders. R: Patient remains safe on the unit.  Patient attended group tonight.  Patient minimally visible on the unit tonight.  Patient taking administered medications.

## 2014-09-19 NOTE — Progress Notes (Signed)
Pt attended spiritual care group on grief and loss facilitated by chaplain Jerene Pitch. Group opened with brief discussion and psycho-social ed around grief and loss in relationships and in relation to self - identifying life patterns, circumstances, changes that cause losses. Established group norm of speaking from own life experience. Group goal of establishing open and affirming space for members to share loss and experience with grief, normalize grief experience and provide psycho social education and grief support.   Gary Osborn was present throughout group.  He did not contribute to group discussion.   [name], [name]

## 2014-09-19 NOTE — BHH Group Notes (Signed)
[place] Group Notes:  (Nursing/MHT/Case Management/Adjunct)  Date:  09/19/2014  Time:  10:02 AM  Type of Therapy:  Nurse Education  Participation Level:  Active  Participation Quality:  Appropriate  Affect:  Appropriate  Cognitive:  Alert and Appropriate and Oriented  Insight:  Appropriate  Engagement in Group:  Engaged and Improving  Modes of Intervention:  Discussion, Education, Exploration and Orientation  Summary of Progress/Problems: Pt states that his long term goal is to "get a little better." Pt states that he doesn't like to do anything outside the house but identifies watching [employer] as a coping skill.   Elenore Rota 09/19/2014, 10:02 AM

## 2014-09-19 NOTE — BHH Group Notes (Signed)
[place] LCSW Group Therapy      Feelings About Diagnosis 1:15 - 2:30 PM         09/19/2014    Type of Therapy:  Group Therapy  Participation Level:  Minimal  Participation Quality:  Appropriate  Affect: Flat, Depressed  Cognitive:   Appropriate  Insight:  Developing/Improving  Engagement in Therapy:  Developing/Improving  Modes of Intervention:  Discussion, Education, Exploration, Problem-Solving, Rapport Building, Support  Summary of Progress/Problems:  Patient advised he believes his diagnosis of depression is legitimate and he accepts its and does it believe it is a negative thing.  Concha Pyo 09/19/2014

## 2014-09-19 NOTE — Progress Notes (Signed)
Patient went to group this morning.  Patient denied SI and HI, contracts for safety.  Denied A/V hallucinations.  Denied pain.

## 2014-09-20 MED ORDER — MIRTAZAPINE 30 MG PO TABS
30.0000 mg | ORAL_TABLET | Freq: Every day | ORAL | Status: DC
  Administered 2014-09-20 – 2014-09-25 (×6): 30 mg via ORAL
  Filled 2014-09-20 (×9): qty 1

## 2014-09-20 MED ORDER — RISPERIDONE 0.5 MG PO TABS
1.5000 mg | ORAL_TABLET | Freq: Every day | ORAL | Status: DC
  Administered 2014-09-20 – 2014-09-25 (×6): 1.5 mg via ORAL
  Filled 2014-09-20 (×9): qty 3

## 2014-09-20 NOTE — Progress Notes (Signed)
D:Patient in the hallway on approach.  Patient states he had a better day but states he remains depressed.  Patient denies SI/HI and denies AVH.  Patient still appears superficial and smiles when talking to staff but states he is depressed.  Patient states he visited with his wife today and he states they are working on their problems but he would not elaborate. A: Staff to monitor Q 15 mins for safety.  Encouragement and support offered.  Scheduled medications administered per orders. R: Patient remains safe on the unit.  Patient attended group tonight.  Patient visible on the unit and interacting with peers.  Patient taking administered medications.

## 2014-09-20 NOTE — BHH Group Notes (Signed)
   [facility] LCSW Aftercare Discharge Planning Group Note  09/20/2014  8:45 AM   Participation Quality: Alert, Appropriate and Oriented  Mood/Affect: Depressed and Flat  Depression Rating: 10  Anxiety Rating: 10  Thoughts of Suicide: Pt denies HI, endorses SI but can contract for safety  Will you contract for safety? Yes  Current AVH: Pt denies  Plan for Discharge/Comments: Pt attended discharge planning group and actively participated in group. CSW provided pt with today's workbook. Patient reports feeling "fair" today and states that his symptoms are the same as on admission. Patient endorses high levels of depression and anxiety as well as SI today.  Transportation Means: Pt reports access to transportation  Supports: Family has been identified as supports.  Tilden Fossa, MSW, [place] Worker [facility] [phone]
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
## 2014-09-20 NOTE — Progress Notes (Signed)
D: Patient denies SI/HI and A/V hallucinations; patient reports sleep is good; reports appetite is good; reports energy level is low ; reports ability to concentrate is good; rates depression as 10/10; rates hopelessness 10/10; rates anxiety as 10/10; patient reports that his goal per self inventory is to improve and also to try harder to help meet the goal; patient reports " this day is not so good and I have some things going on here and out there; Probation officer asked him about what those things were and patient only responded by stating " thank you for your kindness" and he patted me on my hand and walked away; patient was also asked later did he feel the medications were helping and patient stated " no, not really"   A: Monitored q 15 minutes; patient encouraged to attend groups; patient educated about medications; patient given medications per physician orders; patient encouraged to express feelings and/or concerns  R: Patient forwards little and keeps conversation superficial; patient is animated and smiles while rating all his symptoms; patient is inconsistent with his thought content; patient participates but keeps it minimal;patient was able to set goal to talk with staff 1:1 when having feelings of SI; patient is taking medications as prescribed and tolerating medications

## 2014-09-20 NOTE — BHH Group Notes (Signed)
[facility] LCSW Group Therapy 09/20/2014  1:15 PM Type of Therapy: Group Therapy Participation Level: Active  Participation Quality: Attentive, and Supportive  Affect: Depressed and Flat  Cognitive: Alert and Oriented  Insight: Developing/Improving and Engaged  Engagement in Therapy: Developing/Improving and Engaged  Modes of Intervention: Clarification, Confrontation, Discussion, Education, Exploration, Limit-setting, Orientation, Problem-solving, Rapport Building, Art therapist, Socialization and Support  Summary of Progress/Problems: The topic for group today was emotional regulation. This group focused on both positive and negative emotion identification and allowed group members to process ways to identify feelings, regulate negative emotions, and find healthy ways to manage internal/external emotions. Group members were asked to reflect on a time when their reaction to an emotion led to a negative outcome and explored how alternative responses using emotion regulation would have benefited them. Group members were also asked to discuss a time when emotion regulation was utilized when a negative emotion was experienced. Patient actively listened during group but did not want to share today.  Tilden Fossa, MSW, [place] Worker [facility] [phone]

## 2014-09-21 NOTE — Progress Notes (Signed)
Patient ID: Gary Osborn, male   DOB: 1933/09/11, 80 y.o.   MRN: [id number] [facility] MD Progress Note  09/21/2014 1:51 PM Gary Osborn  MRN:  [id number] Subjective:  Patient states he is about the same and sees little progress.    Objective:  I have discussed case with treatment team.  Patient continues to describe ruminations, albeit slightly less intensely focused on these. As noted in prior notes, he believes he is going to jail due to having neglected to include some income on his tax return. As noted, his wife has stated these concerns are either completely unfounded or very exaggerated .  Patient also continues to ruminate about bowel function/habitus, and describes concerns with the size of his genitalia and with  constipation and loss of normal bowel movements, although he states he has had recent bowel movements and at this time denies diarrhea or melenas. With patient's express consent I spoke with patient on the phone again today. She has been visiting him regularly and states " he seems about the same". She does state that both her and her adult children feel that ECT course in the past was very effective for patient :" almost a miracle", but that unfortunately he gradually became ill again after completing course. She is unsure how many ECT treatments he had but thinks it was between 6-8. Does not remember patient having had any significant side effects/memory loss. Patient is unable to commit to ECT  Referral at this time as he remembers treatment as not as effective and causing some memory loss, but today is more amenable to take into consideration wife's perception of response to treatment. He expresses more interest in [place], and I have asked Vonna Kotyk, [employer] technician to come see patient to address patient's questions and appropriateness of this type of treatment.  Visible on unit, going to groups. No behavioral issues and no self injurious behaviors or threats on unit.  No medication  side effects- medications have been titrated slowly due to age, increased risk of side effects, which have been discussed with patient.  As per staff , although still reporting depression and suicidal ideations ( no suicidal plan or intention on unit and able to contract for safety on unit ), but still ruminating about wanting to die before he goes to jail, which as noted, he is currently " certain is going to happen".  Patient responds fairly well to review of coping skills, previous ability to overcome stressors, previous recovery from depressive episodes.  Diagnosis:  MDD with psychotic features.   Total Time spent with patient: 30 minutes   ADL's: fair   Sleep: Good   Appetite:  Good  Suicidal Ideation:  At present patient denies any suicidal  Plan or intention and contracts for safety on unit. Does continue to have suicidal ideations relating to his perception he  Is going to jail as of April 2016, when he files his taxes  Homicidal Ideation:  none AEB (as evidenced by):  Psychiatric Specialty Exam: Physical Exam  Review of Systems  Constitutional: Negative for fever and chills.  Respiratory: Negative for cough and wheezing.   Cardiovascular: Negative for chest pain.  Gastrointestinal: Positive for constipation. Negative for vomiting, abdominal pain and blood in stool.  Skin: Negative for rash.  Neurological: Negative for seizures, loss of consciousness and headaches.  Psychiatric/Behavioral: Positive for depression.    Blood pressure 122/58, pulse 80, temperature 99.2 F (37.3 C), temperature source Oral, resp. rate 18, height 5' 8.75" (  1.746 m), weight 76.204 kg (168 lb).Body mass index is 25 kg/(m^2).  General Appearance: Fairly Groomed  Engineer, water::  Good  Speech:  Normal Rate  Volume:  Normal  Mood:  Depressed- Ruminative, but affect somewhat more reactive   Affect:  anxious. Does smile at times  Thought Process:  Goal Directed  Orientation:  Full (Time, Place,  and Person)  Thought Content:  Rumination and as above. Denies hallucinations and does not appear internally preoccupied   Suicidal Thoughts:  Yes.  without intent/plan- denies plan or intention of hurting self on unit and contracts for safety on unit- see above   Homicidal Thoughts:  No  Memory:  Negative- no evidence of confusion or delirium at this time  Judgement:  Impaired  Insight:  Shallow  Psychomotor Activity:  Normal- no psychomotor restlessness or agitation  Concentration:  Good  Recall:  Good  Fund of Knowledge:Good  Language: Good  Akathisia:  Negative  Handed:  Right  AIMS (if indicated):     Assets:  Communication Skills Desire for Improvement Financial Resources/Insurance  Sleep:  Number of Hours: 6.25   Musculoskeletal: Strength & Muscle Tone: within normal limits Gait & Station: normal Patient leans: N/A  Current Medications: Current Facility-Administered Medications  Medication Dose Route Frequency Provider Last Rate Last Dose  . acetaminophen (TYLENOL) tablet 650 mg  650 mg Oral Q6H PRN Shuvon Rankin, NP      . busPIRone (BUSPAR) tablet 10 mg  10 mg Oral TID Shuvon Rankin, NP   10 mg at 09/21/14 1209  . clonazePAM (KLONOPIN) tablet 0.5 mg  0.5 mg Oral TID Jenne Campus, MD   0.5 mg at 09/21/14 1209  . magnesium hydroxide (MILK OF MAGNESIA) suspension 30 mL  30 mL Oral Daily PRN Shuvon Rankin, NP      . mirtazapine (REMERON) tablet 30 mg  30 mg Oral QHS Jenne Campus, MD   30 mg at 09/20/14 2112  . PARoxetine (PAXIL) tablet 40 mg  40 mg Oral QHS Shuvon Rankin, NP   40 mg at 09/20/14 2112  . pneumococcal 23 valent vaccine (PNU-IMMUNE) injection 0.5 mL  0.5 mL Intramuscular Tomorrow-1000 Myer Peer Kinsleigh Ludolph, MD   0.5 mL at 09/16/14 1519  . risperiDONE (RISPERDAL) tablet 1 mg  1 mg Oral q morning - 10a Jenne Campus, MD   1 mg at 09/21/14 1059  . risperiDONE (RISPERDAL) tablet 1.5 mg  1.5 mg Oral QHS Myer Peer Kelli Egolf, MD   1.5 mg at 09/20/14 2112  .  traZODone (DESYREL) tablet 50 mg  50 mg Oral QHS PRN Shuvon Rankin, NP        Lab Results:  No results found for this or any previous visit (from the past 48 hour(s)).  Physical Findings: AIMS: Facial and Oral Movements Muscles of Facial Expression: None, normal Lips and Perioral Area: None, normal Jaw: None, normal Tongue: None, normal,Extremity Movements Upper (arms, wrists, hands, fingers): None, normal Lower (legs, knees, ankles, toes): None, normal, Trunk Movements Neck, shoulders, hips: None, normal, Overall Severity Severity of abnormal movements (highest score from questions above): None, normal Incapacitation due to abnormal movements: None, normal Patient's awareness of abnormal movements (rate only patient's report): No Awareness, Dental Status Current problems with teeth and/or dentures?: No Does patient usually wear dentures?: No  CIWA:    COWS:      Assessment: Slow improvement on medication regimen. Tolerating these meds well and there is some improvement in range of affect, ability to relate  with others, participation level. Remains, however, ruminative and somatically focused, and continues to have suicidal ideations contingent on going to jail at some time next year, which he believes is going to happen. Able to contract for safety on unit , however. Family members remember patient's response to ECT in the past as robust and without side effects. Patient is doubtful and at this time willing to consider but not to commit to ECT treatment yet. He does express interest in [place] therapy, if considered to be an option for him.    Treatment Plan Summary: Daily contact with patient to assess and evaluate symptoms and progress in treatment Medication management  Plan: Continue inpatient treatment and support  Continue Risperidone  1 mgr QAM and 1.5 mgrs QHS Continue Remeron   30 mmgrs QHS Continue Paxil 40 mgrs QHS  Continue Buspar 10 mgrs TID Continue Klonopin 0.5 mgrs  TID Will request for [place] support staff to come see patient and evaluate appropriateness for [place] treatment and discuss treatment issues with him.  * If patient decides he will accept ECT as treatment modality, may benefit ( if considered appropriate and possible,  from referral or transfer to [facility] , where he can receive this treatment modality.    Medical Decision Making Problem Points:  Established problem, stable/improving (1), Review of last therapy session (1) and Review of psycho-social stressors (1) Data Points:  Review and summation of old records (2) Review of medication regiment & side effects (2) Review of new medications or change in dosage (2)  I certify that inpatient services furnished can reasonably be expected to improve the patient's condition.   Charlaine Utsey 09/21/2014, 1:51 PM

## 2014-09-21 NOTE — Progress Notes (Signed)
Patient ID: Gary Osborn, male   DOB: [birth year], 80 y.o.   MRN: [id number] PER STATE REGULATIONS 482.30  THIS CHART WAS REVIEWED FOR MEDICAL NECESSITY WITH RESPECT TO THE PATIENT'S ADMISSION/ DURATION OF STAY.  NEXT REVIEW DATE: 09/26/2014  Chauncy Lean, RN, BSN CASE MANAGER

## 2014-09-21 NOTE — Progress Notes (Signed)
Patient did attend all but the last twenty minutes of the evening karaoke group.  Pt was engaged and supportive but did not participate by singing a song.

## 2014-09-21 NOTE — BHH Group Notes (Signed)
The focus of this group is to educate the patient on the purpose and policies of crisis stabilization and provide a format to answer questions about their admission.  The group details unit policies and expectations of patients while admitted. Patient attended this group and was engaging. 

## 2014-09-21 NOTE — BHH Group Notes (Signed)
[place] LCSW Group Therapy 09/21/2014 1:15 PM Type of Therapy: Group Therapy Participation Level: Active  Participation Quality: Attentive, Sharing and Supportive  Affect: Depressed and Flat  Cognitive: Alert and Oriented  Insight: Developing/Improving and Engaged  Engagement in Therapy: Developing/Improving and Engaged  Modes of Intervention: Activity, Clarification, Confrontation, Discussion, Education, Exploration, Limit-setting, Orientation, Problem-solving, Rapport Building, Art therapist, Socialization and Support  Summary of Progress/Problems: Patient was attentive and engaged with speaker from [place]. Patient was attentive to speaker while they shared their story of dealing with mental health and overcoming it. Patient expressed interest in their programs and services and received information on their agency. Patient processed ways they can relate to the speaker.   Tilden Fossa, MSW, [place] Worker [facility] [phone]

## 2014-09-21 NOTE — Progress Notes (Signed)
D: Patient denies SI/HI and A/V hallucinations; patient reports sleep is good; reports appetite is good; reports energy level is low ; reports ability to concentrate is poor; rates depression as 9/10; rates hopelessness 9/10; rates anxiety as 9/10;   A: Monitored q 15 minutes; patient encouraged to attend groups; patient educated about medications; patient given medications per physician orders; patient encouraged to express feelings and/or concerns  R: Patient is cooperative and animated but reports there has been no change with how he feels; patient conversation remains superficial and he forwards little; patient's interaction with staff and peers is appropriate; patient was able to set goal to talk with staff 1:1 when having feelings of SI; patient is taking medications as prescribed and tolerating medications; patient is attending all groups

## 2014-09-21 NOTE — Progress Notes (Signed)
Attend group 

## 2014-09-22 NOTE — Tx Team (Signed)
Interdisciplinary Treatment Plan Update   Date Reviewed:  09/22/2014  Time Reviewed:  9:30 am  Progress in Treatment:   Attending groups: Yes, patient is attending group Participating in groups: Patient engages in therapy Taking medication as prescribed: Yes  Tolerating medication: Yes Family/Significant other contact made:  Yes, patient has allowed MD to speak with wife Patient understands diagnosis: Yes, patient understands diagnosis and need for treatment. Discussing patient identified problems/goals with staff: Yes, patient is able to express goals for treatment and discharge. Medical problems stabilized or resolved: Yes Denies suicidal/homicidal ideation: Yes Patient has not harmed self or others: Yes  For review of initial/current patient goals, please see plan of care.  Estimated Length of Stay:  3-5 days  Reasons for Continued Hospitalization:  Anxiety Depression Medication stabilization   New Problems/Goals identified:    Discharge Plan or Barriers: Patient is considering trying [place] during course of hospitalization, as ECT treatments may have been beneficial in the past. [place] referral may be needed due to lack of improvement of symptoms.  Additional Comments:  Continue medication stabilization  Patient is a 80 year old Caucasian Male with a diagnosis of Major Depressive Disorder, Recurrent, Severe. Patient lives in [place] with his wife. Patient reports being hospitalized due to depression and a suicide attempt by [place] himself [place] a [place]. Patient reports a prior attempt in January 2015 but did not elaborate. Patient appears to be worried about stressors that he did not wish to discuss with CSW. Patient was vague about his plans at discharge and states that he feels hopeless and that he has no future. Patient also states that he will not benefit from treatment here. CSW provided emotional support and encouragement. Patient will benefit from crisis stabilization,  medication evaluation, group therapy, and psycho education in addition to case management for discharge planning. Patient and CSW reviewed pt's identified goals and treatment plan. Pt verbalized understanding and agreed to treatment plan.   Attendees: Patient:    Family:    Physician: Dr. Sabra Heck 09/22/2014 9:30 AM  Nursing: Corky Mull, RN 09/22/2014 9:30 AM  Clinical Social Worker: Tilden Fossa, LCSWA 09/22/2014 9:30 AM  Other: Joette Catching, LCSW 09/22/2014 9:30 AM  Other: Edwyna Shell, LCSW 09/22/2014 9:30 AM  Other: Lars Pinks, Case Manager 09/22/2014 9:30 AM  Other: Agustina Caroli, NP 09/22/2014 9:30 AM  Other:    Other:      Tilden Fossa, MSW, [place] Worker [facility] [phone]

## 2014-09-22 NOTE — BHH Group Notes (Signed)
[place] LCSW Group Therapy 09/22/2014 1:15 PM Type of Therapy: Group Therapy Participation Level: Active  Participation Quality: Attentive, Sharing and Supportive  Affect: Depressed and Flat  Cognitive: Alert and Oriented  Insight: Developing/Improving and Engaged  Engagement in Therapy: Developing/Improving and Engaged  Modes of Intervention: Clarification, Confrontation, Discussion, Education, Exploration, Limit-setting, Orientation, Problem-solving, Rapport Building, Art therapist, Socialization and Support  Summary of Progress/Problems: The topic for today was feelings about relapse. Pt discussed what relapse prevention is to them and identified triggers that they are on the path to relapse. Pt processed their feeling towards relapse and was able to relate to peers. Pt discussed coping skills that can be used for relapse prevention. Patient identified his relapse behavior as suicidal attempts. Patient reports that he has the emotional support of his wife but feels that family don't believe him and think that he is "exaggerating" things. Patient reports that he would have the will to live if he had a "bright and promising future". Patient unable to identify any sense of hope in his life currently. CSW's and other group members provided emotional support and encouragement.   Tilden Fossa, MSW, [place] Worker [facility] [phone]

## 2014-09-22 NOTE — BHH Group Notes (Signed)
   [facility] LCSW Aftercare Discharge Planning Group Note  09/22/2014  8:45 AM   Participation Quality: Alert, Appropriate and Oriented  Mood/Affect: Depressed and Flat  Depression Rating: 10  Anxiety Rating: 10  Thoughts of Suicide: Pt denies SI/HI  Will you contract for safety? Yes  Current AVH: Pt denies  Plan for Discharge/Comments: Pt attended discharge planning group and actively participated in group. CSW provided pt with today's workbook. Patient reports feeling "terrible" today and is not improving. Patient endorses high levels of depression and anxiety.   Transportation Means: Pt reports access to transportation  Supports: Patient's family has been identified as a support.  Tilden Fossa, MSW, [place] Worker [facility] [phone]

## 2014-09-22 NOTE — Progress Notes (Signed)
D. Pt has been up and visible in milieu this evening, did attend and participate in evening karaoke group, spoke about how he left early as he had enough. Pt did appear depressed this evening and spoke briefly about [place] treatment and how he said he read the brochures provided to him but does not believe it will work for him. Pt did receive medications without incident. A. Support and encouragement provided. R. Safety maintained, will continue to monitor.

## 2014-09-22 NOTE — Progress Notes (Signed)
Patient denies pain, SI, AH/VH. Patient stated that he feels much better. Wife visited. Receives his due medications. No new complaint. Support and encouragement given to patient. Will continue to monitor patient.

## 2014-09-22 NOTE — Progress Notes (Signed)
[facility] MD Progress Note  09/22/2014 5:01 PM AVANT PRINTY  MRN:  [id number] Subjective:  Gary Osborn continues to endorse worries concerns about the money he will owe in taxes, his going to jail and the size of his penis for what he hardly drinks so he does not have to got and urinate and be seen by his roommate. Completely hopeless helpless "nothing is going to work, nothing is going to change"  Diagnosis:   DSM5: Depressive Disorders:  Major Depressive Disorder - with Psychotic Features (296.24) Total Time spent with patient: 30 minutes  Axis I: Generalized Anxiety Disorder  ADL's:  Intact  Sleep: Fair  Appetite:  Fair Psychiatric Specialty Exam: Physical Exam  Review of Systems  Constitutional: Positive for malaise/fatigue.  HENT: Negative.   Eyes: Negative.   Respiratory: Negative.   Cardiovascular: Negative.   Gastrointestinal: Negative.   Genitourinary: Negative.   Musculoskeletal: Negative.   Skin: Negative.   Neurological: Negative.   Endo/Heme/Allergies: Negative.   Psychiatric/Behavioral: Positive for depression and suicidal ideas.    Blood pressure 106/60, pulse 78, temperature 99.6 F (37.6 C), temperature source Oral, resp. rate 18, height 5' 8.75" (1.746 m), weight 76.204 kg (168 lb).Body mass index is 25 kg/(m^2).  General Appearance: Fairly Groomed  Engineer, water::  Fair  Speech:  Clear and Coherent and Slow  Volume:  Decreased  Mood:  Depressed  Affect:  Restricted  Thought Process:  Coherent and Goal Directed  Orientation:  Full (Time, Place, and Person)  Thought Content:  symptoms events worries concerns  Suicidal Thoughts:  Yes.  without intent/plan  Homicidal Thoughts:  No  Memory:  Immediate;   Fair Recent;   Fair Remote;   Fair  Judgement:  Fair  Insight:  Present  Psychomotor Activity:  Restlessness  Concentration:  Fair  Recall:  [employer] of Knowledge:NA  Language: Fair  Akathisia:  No  Handed:    AIMS (if indicated):     Assets:   Housing Social Support  Sleep:  Number of Hours: 6.25   Musculoskeletal: Strength & Muscle Tone: within normal limits Gait & Station: normal Patient leans: N/A  Current Medications: Current Facility-Administered Medications  Medication Dose Route Frequency Provider Last Rate Last Dose  . acetaminophen (TYLENOL) tablet 650 mg  650 mg Oral Q6H PRN Shuvon Rankin, NP      . busPIRone (BUSPAR) tablet 10 mg  10 mg Oral TID Shuvon Rankin, NP   10 mg at 09/22/14 1204  . clonazePAM (KLONOPIN) tablet 0.5 mg  0.5 mg Oral TID Jenne Campus, MD   0.5 mg at 09/22/14 1204  . magnesium hydroxide (MILK OF MAGNESIA) suspension 30 mL  30 mL Oral Daily PRN Shuvon Rankin, NP      . mirtazapine (REMERON) tablet 30 mg  30 mg Oral QHS Jenne Campus, MD   30 mg at 09/21/14 2128  . PARoxetine (PAXIL) tablet 40 mg  40 mg Oral QHS Shuvon Rankin, NP   40 mg at 09/21/14 2128  . pneumococcal 23 valent vaccine (PNU-IMMUNE) injection 0.5 mL  0.5 mL Intramuscular Tomorrow-1000 Myer Peer Cobos, MD   0.5 mL at 09/16/14 1519  . risperiDONE (RISPERDAL) tablet 1 mg  1 mg Oral q morning - 10a Jenne Campus, MD   1 mg at 09/22/14 0944  . risperiDONE (RISPERDAL) tablet 1.5 mg  1.5 mg Oral QHS Myer Peer Cobos, MD   1.5 mg at 09/21/14 2128  . traZODone (DESYREL) tablet 50 mg  50 mg  Oral QHS PRN Shuvon Rankin, NP        Lab Results: No results found for this or any previous visit (from the past 48 hour(s)).  Physical Findings: AIMS: Facial and Oral Movements Muscles of Facial Expression: None, normal Lips and Perioral Area: None, normal Jaw: None, normal Tongue: None, normal,Extremity Movements Upper (arms, wrists, hands, fingers): None, normal Lower (legs, knees, ankles, toes): None, normal, Trunk Movements Neck, shoulders, hips: None, normal, Overall Severity Severity of abnormal movements (highest score from questions above): None, normal Incapacitation due to abnormal movements: None, normal Patient's  awareness of abnormal movements (rate only patient's report): No Awareness, Dental Status Current problems with teeth and/or dentures?: No Does patient usually wear dentures?: No  CIWA:    COWS:     Treatment Plan Summary: Daily contact with patient to assess and evaluate symptoms and progress in treatment Medication management  Plan: Supportive approach/coping skills           Challenge the distorted thinking           He is at this time refusing ECT, [place]  Medical Decision Making Problem Points:  Established problem, worsening (2) and Review of psycho-social stressors (1) Data Points:  Review of medication regiment & side effects (2)  I certify that inpatient services furnished can reasonably be expected to improve the patient's condition.   Coyt Govoni A 09/22/2014, 5:01 PM

## 2014-09-23 DIAGNOSIS — F411 Generalized anxiety disorder: Secondary | ICD-10-CM

## 2014-09-23 NOTE — Progress Notes (Signed)
Patient ID: Gary Osborn, male   DOB: 03-18-1933, 80 y.o.   MRN: [id number] [facility] MD Progress Note  09/23/2014 3:40 PM Gary Osborn  MRN:  [id number]  Subjective:  [name] reports, "I'm not doing too well today. But, I don't want to go into any details. The medicine I'm getting here is working because it helps me sleep. However, the bulk of my problems remain".  Diagnosis:   DSM5: Depressive Disorders:  Major Depressive Disorder - with Psychotic Features (296.24) Total Time spent with patient: 30 minutes  Axis I: Generalized Anxiety Disorder  ADL's:  Intact  Sleep: Fair  Appetite:  Fair Psychiatric Specialty Exam: Physical Exam  Review of Systems  Constitutional: Positive for malaise/fatigue.  HENT: Negative.   Eyes: Negative.   Respiratory: Negative.   Cardiovascular: Negative.   Gastrointestinal: Negative.   Genitourinary: Negative.   Musculoskeletal: Negative.   Skin: Negative.   Neurological: Negative.   Endo/Heme/Allergies: Negative.   Psychiatric/Behavioral: Positive for depression and suicidal ideas.    Blood pressure 117/73, pulse 85, temperature 98.6 F (37 C), temperature source Oral, resp. rate 16, height 5' 8.75" (1.746 m), weight 76.204 kg (168 lb).Body mass index is 25 kg/(m^2).  General Appearance: Fairly Groomed  Engineer, water::  Fair  Speech:  Clear and Coherent and Slow  Volume:  Decreased  Mood:  Depressed  Affect:  Restricted  Thought Process:  Coherent and Goal Directed  Orientation:  Full (Time, Place, and Person)  Thought Content:  symptoms events worries concerns  Suicidal Thoughts:  Yes.  without intent/plan  Homicidal Thoughts:  No  Memory:  Immediate;   Fair Recent;   Fair Remote;   Fair  Judgement:  Fair  Insight:  Present  Psychomotor Activity:  Restlessness  Concentration:  Fair  Recall:  [employer] of Knowledge:NA  Language: Fair  Akathisia:  No  Handed:    AIMS (if indicated):     Assets:  Housing Social Support  Sleep:  Number  of Hours: 6.75   Musculoskeletal: Strength & Muscle Tone: within normal limits Gait & Station: normal Patient leans: N/A  Current Medications: Current Facility-Administered Medications  Medication Dose Route Frequency Provider Last Rate Last Dose  . acetaminophen (TYLENOL) tablet 650 mg  650 mg Oral Q6H PRN Shuvon Rankin, NP      . busPIRone (BUSPAR) tablet 10 mg  10 mg Oral TID Shuvon Rankin, NP   10 mg at 09/23/14 0818  . clonazePAM (KLONOPIN) tablet 0.5 mg  0.5 mg Oral TID Jenne Campus, MD   0.5 mg at 09/23/14 0818  . magnesium hydroxide (MILK OF MAGNESIA) suspension 30 mL  30 mL Oral Daily PRN Shuvon Rankin, NP   30 mL at 09/23/14 0818  . mirtazapine (REMERON) tablet 30 mg  30 mg Oral QHS Jenne Campus, MD   30 mg at 09/22/14 2102  . PARoxetine (PAXIL) tablet 40 mg  40 mg Oral QHS Shuvon Rankin, NP   40 mg at 09/22/14 2101  . pneumococcal 23 valent vaccine (PNU-IMMUNE) injection 0.5 mL  0.5 mL Intramuscular Tomorrow-1000 Myer Peer Cobos, MD   0.5 mL at 09/16/14 1519  . risperiDONE (RISPERDAL) tablet 1 mg  1 mg Oral q morning - 10a Jenne Campus, MD   1 mg at 09/22/14 0944  . risperiDONE (RISPERDAL) tablet 1.5 mg  1.5 mg Oral QHS Myer Peer Cobos, MD   1.5 mg at 09/22/14 2102  . traZODone (DESYREL) tablet 50 mg  50 mg Oral  QHS PRN Shuvon Rankin, NP        Lab Results: No results found for this or any previous visit (from the past 48 hour(s)).  Physical Findings: AIMS: Facial and Oral Movements Muscles of Facial Expression: None, normal Lips and Perioral Area: None, normal Jaw: None, normal Tongue: None, normal,Extremity Movements Upper (arms, wrists, hands, fingers): None, normal Lower (legs, knees, ankles, toes): None, normal, Trunk Movements Neck, shoulders, hips: None, normal, Overall Severity Severity of abnormal movements (highest score from questions above): None, normal Incapacitation due to abnormal movements: None, normal Patient's awareness of abnormal  movements (rate only patient's report): No Awareness, Dental Status Current problems with teeth and/or dentures?: No Does patient usually wear dentures?: No  CIWA:    COWS:     Treatment Plan Summary: Daily contact with patient to assess and evaluate symptoms and progress in treatment Medication management  Plan: Supportive approach/coping skills Challenge the distorted thinking.  He is at this time refusing ECT, [place]. Continue current treatment plan.  Medical Decision Making Problem Points:  Established problem, worsening (2) and Review of psycho-social stressors (1) Data Points:  Review of medication regiment & side effects (2)  I certify that inpatient services furnished can reasonably be expected to improve the patient's condition.   Encarnacion Slates, PMHNP-BC 09/23/2014, 3:40 PM I agree with assessment and plan Geralyn Flash A. Sabra Heck, M.D.

## 2014-09-23 NOTE — Progress Notes (Signed)
D) Pt has been attending the groups and participating in them. Pt states that he feels worse this week than he did last weekend. States "if anything, I really feel worse.". Affect is flat and mood depressed. Has volunteered more conversation spontaneously today than last weekend. Eye contact has also improved. A) Given support, reassurance and praise. Encouragement given along with support R) Rates his depression, hopelessness and helplessness all at a 9 and denies SI presently.

## 2014-09-23 NOTE — BHH Group Notes (Signed)
[place] Group Notes:  (Nursing/MHT/Case Management/Adjunct)  Date:  09/23/2014  Time:  2:31 PM  Type of Therapy:  Psychoeducational Skills  Participation Level:  Active  Participation Quality:  Appropriate  Affect:  Appropriate  Cognitive:  Appropriate  Insight:  Appropriate  Engagement in Group:  Engaged  Modes of Intervention:  Discussion  Summary of Progress/Problems: Pt did attend self inventory group, pt reported that he was negative SI/HI, no AH/VH noted. Pt rated his depression as a 10, and his helplessness/hopelessness as a 10.     Pt reported concerns about increased anxiety and increased depression with out any relief from current medication regimen, pt advised that the doctor will be made aware.    Gary Osborn Shanta 09/23/2014, 2:31 PM

## 2014-09-23 NOTE — Progress Notes (Signed)
The patient attended the A. A. Meeting last evening and was appropriate. The patient spoke up briefly and indicated that he came to the hospital because he was taking six different medications and that he was beginning to feel "fuzzy", almost as if he were "under the influence".

## 2014-09-23 NOTE — Progress Notes (Signed)
[facility] Group Notes:  (Nursing/MHT/Case Management/Adjunct)  Date:  09/23/2014  Time:  11:43 PM  Type of Therapy:  Wrap Up Group   Participation Level:  Minimal  Participation Quality:  Resistant  Affect:  Flat  Cognitive:  Lacking  Insight:  Lacking  Engagement in Group:  Lacking  Modes of Intervention:  Education  Summary of Progress/Problems: The patient verbalized in group that he had a bad day despite having sat down with his doctor. The patient would not explain any further.   Archie Balboa S 09/23/2014, 11:43 PM

## 2014-09-23 NOTE — Progress Notes (Signed)
Psychoeducational Group Note  Date: 09/23/2014 Time:  1015  Group Topic/Focus:  Identifying Needs:   The focus of this group is to help patients identify their personal needs that have been historically problematic and identify healthy behaviors to address their needs.  Participation Level:  Active  Participation Quality:  Appropriate  Affect:  Appropriate  Cognitive:  Oriented  Insight:  Improving  Engagement in Group:  Engaged  Additional Comments:  Pt attending and participating in the discussion. Shows insight.  Paulino Rily

## 2014-09-24 NOTE — Progress Notes (Signed)
Psychoeducational Group Note  Date: 09/24/2014 Time: 1015  Group Topic/Focus:  Making Healthy Choices:   The focus of this group is to help patients identify negative/unhealthy choices they were using prior to admission and identify positive/healthier coping strategies to replace them upon discharge.  Participation Level:  Active  Participation Quality:  Appropriate  Affect:  Anxious  Cognitive:  Appropriate  Insight:  Engaged  Engagement in Group:  Distracting  Additional Comments:    09/24/2014,6:58 PM Riven Beebe, Trixie Rude

## 2014-09-24 NOTE — Progress Notes (Signed)
[facility] MD Progress Note  09/24/2014 5:04 PM Gary Osborn  MRN:  [id number] Subjective:  Gary Osborn is still fixed in the delusional thinking. He does not want to pursue [place] or ECT. States that any improvement from ECT came from having amnesia and temporarily forgetting of what was going on. States that lasted only few months. He is not drinking much to avoid going to the bathroom to urinate. On the other hand his not drinking fluids is causing constipation. He admits to having a lot of anxiety when he thinks about what is ahead once he gets out of here. States he will be taken to jail Diagnosis:   DSM5: Depressive Disorders:  Major Depressive Disorder - with Psychotic Features (296.24) Total Time spent with patient: 30 minutes  Axis I: Generalized Anxiety Disorder  ADL's:  Intact  Sleep: Fair  Appetite:  Fair  Psychiatric Specialty Exam: Physical Exam  Review of Systems  Constitutional: Negative.   HENT: Negative.   Eyes: Negative.   Respiratory: Negative.   Cardiovascular: Negative.   Gastrointestinal: Positive for constipation.  Genitourinary: Negative.   Musculoskeletal: Negative.   Skin: Negative.   Neurological: Negative.   Endo/Heme/Allergies: Negative.   Psychiatric/Behavioral: Positive for depression. The patient is nervous/anxious.     Blood pressure 113/73, pulse 79, temperature 97.8 F (36.6 C), temperature source Oral, resp. rate 18, height 5' 8.75" (1.746 m), weight 76.204 kg (168 lb).Body mass index is 25 kg/(m^2).  General Appearance: Fairly Groomed  Engineer, water::  Fair  Speech:  Clear and Coherent and Slow  Volume:  Decreased  Mood:  Anxious, Depressed and worried  Affect:  restricted  Thought Process:  Coherent and Goal Directed  Orientation:  Full (Time, Place, and Person)  Thought Content:  Delusions  Suicidal Thoughts:  No  Homicidal Thoughts:  No  Memory:  Immediate;   Fair Recent;   Fair Remote;   Fair  Judgement:  Fair  Insight:  Lacking   Psychomotor Activity:  Normal  Concentration:  Fair  Recall:  [place]  Language: Fair  Akathisia:  No  Handed:    AIMS (if indicated):     Assets:  Housing Social Support  Sleep:  Number of Hours: 6.25   Musculoskeletal: Strength & Muscle Tone: within normal limits Gait & Station: normal Patient leans: N/A  Current Medications: Current Facility-Administered Medications  Medication Dose Route Frequency Provider Last Rate Last Dose  . acetaminophen (TYLENOL) tablet 650 mg  650 mg Oral Q6H PRN Shuvon Rankin, NP      . busPIRone (BUSPAR) tablet 10 mg  10 mg Oral TID Shuvon Rankin, NP   10 mg at 09/24/14 1156  . clonazePAM (KLONOPIN) tablet 0.5 mg  0.5 mg Oral TID Jenne Campus, MD   0.5 mg at 09/24/14 1156  . magnesium hydroxide (MILK OF MAGNESIA) suspension 30 mL  30 mL Oral Daily PRN Shuvon Rankin, NP   30 mL at 09/23/14 0818  . mirtazapine (REMERON) tablet 30 mg  30 mg Oral QHS Jenne Campus, MD   30 mg at 09/23/14 2107  . PARoxetine (PAXIL) tablet 40 mg  40 mg Oral QHS Shuvon Rankin, NP   40 mg at 09/23/14 2107  . pneumococcal 23 valent vaccine (PNU-IMMUNE) injection 0.5 mL  0.5 mL Intramuscular Tomorrow-1000 Myer Peer Cobos, MD   0.5 mL at 09/16/14 1519  . risperiDONE (RISPERDAL) tablet 1 mg  1 mg Oral q morning - 10a Jenne Campus, MD  1 mg at 09/24/14 1157  . risperiDONE (RISPERDAL) tablet 1.5 mg  1.5 mg Oral QHS Jenne Campus, MD   1.5 mg at 09/23/14 2107  . traZODone (DESYREL) tablet 50 mg  50 mg Oral QHS PRN Shuvon Rankin, NP        Lab Results: No results found for this or any previous visit (from the past 48 hour(s)).  Physical Findings: AIMS: Facial and Oral Movements Muscles of Facial Expression: None, normal Lips and Perioral Area: None, normal Jaw: None, normal Tongue: None, normal,Extremity Movements Upper (arms, wrists, hands, fingers): None, normal Lower (legs, knees, ankles, toes): None, normal, Trunk Movements Neck,  shoulders, hips: None, normal, Overall Severity Severity of abnormal movements (highest score from questions above): None, normal Incapacitation due to abnormal movements: None, normal Patient's awareness of abnormal movements (rate only patient's report): No Awareness, Dental Status Current problems with teeth and/or dentures?: No Does patient usually wear dentures?: No  CIWA:    COWS:     Treatment Plan Summary: Daily contact with patient to assess and evaluate symptoms and progress in treatment Medication management  Plan: Supportive approach/improve reality testing/challenge the distortions           Will do not admit to his room as soon as his roommate is D/C to alleviate  the worry of                 being seen by a roommate in the bathroom            Still try to present ECT as an options Medical Decision Making Problem Points:  Review of psycho-social stressors (1) Data Points:  Review of medication regiment & side effects (2)  I certify that inpatient services furnished can reasonably be expected to improve the patient's condition.   Shawnmichael Parenteau A 09/24/2014, 5:04 PM

## 2014-09-24 NOTE — Plan of Care (Signed)
Problem: Diagnosis: Increased Risk For Suicide Attempt Goal: STG-Patient Will Comply With Medication Regime Outcome: Progressing Patient compliant with scheduled medications.

## 2014-09-24 NOTE — Progress Notes (Signed)
Psychoeducational Group Note  Date:  09/24/2014 Time: 1015 Group Topic/Focus:  Making Healthy Choices:   The focus of this group is to help patients identify negative/unhealthy choices they were using prior to admission and identify positive/healthier coping strategies to replace them upon discharge.  Participation Level:  Minimal  Participation Quality:  Drowsy  Affect:  Lethargic  Cognitive:  Oriented  Insight:  Limited  Engagement in Group:  Limited  Additional Comments:    Lauralyn Primes 6:23 PM. 09/24/2014

## 2014-09-24 NOTE — Progress Notes (Signed)
Writer spoke with patient 1:1 and asked how he was doing and he reported that he felt bad but did not go into detail. Although patient reports that he feel bad writer does notice that he looks more rested than last weekend and appears a little less anxious.He was observed by writer up in the dayroom eating a snack before speaking with him. Writer asked if he felt that his medications were helping and he reports that they help him to sleep and chuckled. He made mention that he feels that he will never get out of here. Writer asked if he had talked with his doctor about this and he reported that he quess that he needs to speak to him about this. Patient denies si/hi/a/v hallucinations. Support and encouragement given, safety maintained on unit with 15 min checks.

## 2014-09-24 NOTE — Progress Notes (Signed)
D) Pt has attended the groups but will sit in them and fall asleep. Is speaking up more and his eye contact is improved. Remains depressed rating his anxiety, hopelessness and anxiety all at a 10. Presently denies SI and HI. Pt appears to be a bit more alert with interaction. Initiated conversation with this Probation officer twice today. Continues to say that his situation is without hope. A) Given support, encouraged to go to the groups, Provided with a brief 1:1. Given support and provided with active listening R) Pt attends the groups and stays in the dayroom with his peers. Looking forward to his visit with his wife.

## 2014-09-25 NOTE — Progress Notes (Signed)
Patient ID: Gary Osborn, male   DOB: 02-18-1933, 80 y.o.   MRN: [id number] [facility] MD Progress Note  09/25/2014 1:50 PM Gary Osborn  MRN:  [id number] Subjective:   Patient states "I feel better because I had a bowel movement. I guess I feel a little less depressed. But not much because I have so much to worry about. I have lived a comfortable middle class life that will soon be coming to an end. My wife thinks it is all in my mind. My going to jail is not fantasy but reality. My accountant said there is no solution to the financial mistakes I have made. I don't really need to repeat the whole story. I have nothing to look forward to anymore. I have trouble using the restroom because I have a roommate."   Objective:  Patient is visible on the unit and is attending groups. He reports that the groups are not helpful as the other patients are not able to relate to his story. The patient appears anxious and depressed. He continues to ruminate over going to jail and sees no hope in being able to form a solution. Patient is compliant with his medications. He only endorses some mild sedation but states "I guess that's better than being very anxious." When asked about suicidal ideation the patient acknowledges it as a possibility. Patient worries that he will never leave the hospital due to safety concerns.   Diagnosis:   DSM5: Depressive Disorders:  Major Depressive Disorder - with Psychotic Features (296.24) Total Time spent with patient: 20 minutes  Axis I: Generalized Anxiety Disorder  ADL's:  Intact  Sleep: Good  Appetite:  Fair  Psychiatric Specialty Exam: Physical Exam  Review of Systems  Constitutional: Negative.   HENT: Negative.   Eyes: Negative.   Respiratory: Negative.   Cardiovascular: Negative.   Gastrointestinal: Positive for constipation.  Genitourinary: Negative.   Musculoskeletal: Negative.   Skin: Negative.   Neurological: Negative.   Endo/Heme/Allergies: Negative.    Psychiatric/Behavioral: Positive for depression and suicidal ideas. The patient is nervous/anxious.     Blood pressure 119/71, pulse 84, temperature 98.2 F (36.8 C), temperature source Oral, resp. rate 16, height 5' 8.75" (1.746 m), weight 76.204 kg (168 lb).Body mass index is 25 kg/(m^2).  General Appearance: Fairly Groomed  Engineer, water::  Fair  Speech:  Clear and Coherent and Slow  Volume:  Decreased  Mood:  Anxious, Depressed and worried  Affect:  restricted  Thought Process:  Coherent and Goal Directed  Orientation:  Full (Time, Place, and Person)  Thought Content:  Delusions and Rumination  Suicidal Thoughts:  Yes.  without intent/plan  Homicidal Thoughts:  No  Memory:  Immediate;   Fair Recent;   Fair Remote;   Fair  Judgement:  Fair  Insight:  Lacking  Psychomotor Activity:  Normal  Concentration:  Fair  Recall:  [place]  Language: Fair  Akathisia:  No  Handed:    AIMS (if indicated):     Assets:  Housing Leisure Time Social Support Talents/Skills  Sleep:  Number of Hours: 6.75   Musculoskeletal: Strength & Muscle Tone: within normal limits Gait & Station: normal Patient leans: N/A  Current Medications: Current Facility-Administered Medications  Medication Dose Route Frequency Provider Last Rate Last Dose  . acetaminophen (TYLENOL) tablet 650 mg  650 mg Oral Q6H PRN Shuvon Rankin, NP      . busPIRone (BUSPAR) tablet 10 mg  10 mg Oral TID Shuvon Rankin,  NP   10 mg at 09/25/14 1156  . clonazePAM (KLONOPIN) tablet 0.5 mg  0.5 mg Oral TID Jenne Campus, MD   0.5 mg at 09/25/14 1157  . magnesium hydroxide (MILK OF MAGNESIA) suspension 30 mL  30 mL Oral Daily PRN Shuvon Rankin, NP   30 mL at 09/23/14 0818  . mirtazapine (REMERON) tablet 30 mg  30 mg Oral QHS Jenne Campus, MD   30 mg at 09/24/14 2113  . PARoxetine (PAXIL) tablet 40 mg  40 mg Oral QHS Shuvon Rankin, NP   40 mg at 09/24/14 2113  . pneumococcal 23 valent vaccine (PNU-IMMUNE)  injection 0.5 mL  0.5 mL Intramuscular Tomorrow-1000 Myer Peer Cobos, MD   0.5 mL at 09/16/14 1519  . risperiDONE (RISPERDAL) tablet 1 mg  1 mg Oral q morning - 10a Jenne Campus, MD   1 mg at 09/25/14 1042  . risperiDONE (RISPERDAL) tablet 1.5 mg  1.5 mg Oral QHS Jenne Campus, MD   1.5 mg at 09/24/14 2113  . traZODone (DESYREL) tablet 50 mg  50 mg Oral QHS PRN Shuvon Rankin, NP        Lab Results: No results found for this or any previous visit (from the past 48 hour(s)).  Physical Findings: AIMS: Facial and Oral Movements Muscles of Facial Expression: None, normal Lips and Perioral Area: None, normal Jaw: None, normal Tongue: None, normal,Extremity Movements Upper (arms, wrists, hands, fingers): None, normal Lower (legs, knees, ankles, toes): None, normal, Trunk Movements Neck, shoulders, hips: None, normal, Overall Severity Severity of abnormal movements (highest score from questions above): None, normal Incapacitation due to abnormal movements: None, normal Patient's awareness of abnormal movements (rate only patient's report): No Awareness, Dental Status Current problems with teeth and/or dentures?: No Does patient usually wear dentures?: No  CIWA:    COWS:     Treatment Plan Summary: Daily contact with patient to assess and evaluate symptoms and progress in treatment Medication management  Plan: 1. Supportive approach/improve reality testing/challenge the distortions           2. Will do not admit to his room as soon as his roommate is D/C to alleviate  the worry of                 being seen by a roommate in the bathroom            3. Still try to present ECT as an option           4. Continue Buspar 10 mg TID for anxiety            Continue Klonopin 0.5 mg TID for anxiety            Continue Remeron 30 mg hs for insomnia/depression              Continue Risperdal 1.5 mg every morning and 1.5 mg hs for psychotic symptoms   Medical Decision Making Problem Points:   Established problem, stable/improving (1), Review of last therapy session (1) and Review of psycho-social stressors (1) Data Points:  Review of medication regiment & side effects (2)  I certify that inpatient services furnished can reasonably be expected to improve the patient's condition.   DAVIS, LAURA NP-C 09/25/2014, 1:50 PM I agree with assessment and plan Geralyn Flash A. Sabra Heck, M.D.

## 2014-09-25 NOTE — Progress Notes (Signed)
Psychoeducational Group Note  Date:  09/25/2014 Time:  0340  Group Topic/Focus:  Wrap-Up Group:   The focus of this group is to help patients review their daily goal of treatment and discuss progress on daily workbooks.  Participation Level: Did Not Attend  Participation Quality:  Not Applicable  Affect:  Not Applicable  Cognitive:  Not Applicable  Insight:  Not Applicable  Engagement in Group: Not Applicable  Additional Comments:  The patient did not attend the A. A. Meeting last evening since the group did not apply to him.   Dru Primeau S 09/25/2014, 3:42 AM

## 2014-09-25 NOTE — Progress Notes (Signed)
D: Pt presents flat in affect and depressed in mood. Pt verbalizes that he is not doing good in reference to his mood. Pt does not elaborate. Pt observed interacting appropriately within the milieu. Pt is complaint with his current medications. Pt is currently denying any SI/HI/AVH.  A: Writer administered scheduled medications to pt, per MD . Continued support and availability as needed was extended to this pt. Staff continue to monitor pt with q69min checks.  R: No adverse drug reactions noted. Pt receptive to treatment. Pt remains safe at this time.

## 2014-09-25 NOTE — Progress Notes (Signed)
D: Patient denies SI/HI and A/V hallucinations; patient reports sleep is good;  rates depression as 9/10; rates hopelessness 9/10; rates anxiety as 9/10;   A: Monitored q 15 minutes; patient encouraged to attend groups; patient educated about medications; patient given medications per physician orders; patient encouraged to express feelings and/or concerns  R: Patient is cooperative and appropriate to circumstances; patient states " I feel even"; patient's interaction with staff and peers is minimal; patient was able to set goal to talk with staff 1:1 when having feelings of SI; patient is taking medications as prescribed and tolerating medications; patient is attending some groups and sometimes goes to his room and lay down

## 2014-09-25 NOTE — Progress Notes (Signed)
D: Pt reports no improvement in his mood. However, he reports that he has been dealing with it better. Pt mentions that being here is a relief from facing his current stressor. Pt is currently denying any SI. Pt remains compliant with his current plan of care.  A: Continued support and availability as needed was extended to this pt. Staff continue to monitor pt with q66min checks.  R: No adverse drug reactions noted. Pt receptive to treatment. Pt remains safe at this time.

## 2014-09-25 NOTE — Progress Notes (Signed)
Adult Psychoeducational Group Note  Date:  09/25/2014 Time:  10:00AM  Group Topic/Focus:  Making Healthy Choices:   The focus of this group is to help patients identify negative/unhealthy choices they were using prior to admission and identify positive/healthier coping strategies to replace them upon discharge.  Participation Level:  Active  Participation Quality:  Appropriate  Affect:  Appropriate  Cognitive:  Appropriate  Insight: Appropriate  Engagement in Group:  Engaged  Modes of Intervention:  Activity and Discussion  Additional Comments:  Pt was active during group. Pt identified "ABC's of Leisure," identifying a positive activity associated with every letter of the alphabet (A for aerobics, B for bad mitten and C for cycling). Pt also participated in the group activity. Pt played the game "Sherrie George" with staff and peers.   Domenick Quebedeaux K 09/25/2014, 11:15 AM

## 2014-09-26 MED ORDER — LACTULOSE 10 GM/15ML PO SOLN
20.0000 g | Freq: Two times a day (BID) | ORAL | Status: DC | PRN
  Administered 2014-09-27: 20 g via ORAL
  Filled 2014-09-26 (×3): qty 30

## 2014-09-26 MED ORDER — RISPERIDONE 2 MG PO TABS
2.0000 mg | ORAL_TABLET | Freq: Two times a day (BID) | ORAL | Status: DC
  Administered 2014-09-27 – 2014-09-28 (×3): 2 mg via ORAL
  Filled 2014-09-26 (×7): qty 1

## 2014-09-26 MED ORDER — MIRTAZAPINE 15 MG PO TABS
45.0000 mg | ORAL_TABLET | Freq: Every day | ORAL | Status: DC
  Administered 2014-09-26 – 2014-09-27 (×2): 45 mg via ORAL
  Filled 2014-09-26 (×4): qty 3

## 2014-09-26 NOTE — Progress Notes (Signed)
Recreation Therapy Notes  Animal-Assisted Activity/Therapy (AAA/T) Program Checklist/Progress Notes Patient Eligibility Criteria Checklist & Daily Group note for Rec Tx Intervention  Date: 12.15.2015 Time: 2:45pm Location: [address]    AAA/T Program Assumption of Risk Form signed by Patient/ or Parent Legal Guardian yes  Patient is free of allergies or sever asthma yes  Patient reports no fear of animals yes  Patient reports no history of cruelty to animals yes  Patient understands his/her participation is voluntary yes  Patient washes hands before animal contact yes  Patient washes hands after animal contact yes  Behavioral Response: Did not attend  [employer], LRT/CTRS  Lane Hacker 09/26/2014 4:31 PM

## 2014-09-26 NOTE — Progress Notes (Addendum)
Pt attended spiritual care group on grief and loss facilitated by counseling intern [place] Myha Arizpe. Group opened with brief discussion and psycho-social ed around grief and loss in relationships and in relation to self - identifying life patterns, circumstances, changes that cause losses. Established group norm of speaking from own life experience. Group goal of establishing open and affirming space for members to share loss and experience with grief, normalize grief experience and provide psycho social education and grief support.   Drey was present throughout group, and returned after stepping out for treatment team. He did not contribute to group discussion. Other group members did express concern regarding thoughts of SI for Tru while he was not in the room.  [place] Jefry Lesinski Counseling Intern

## 2014-09-26 NOTE — Progress Notes (Addendum)
Patient ID: Gary Osborn, male   DOB: March 26, 1933, 80 y.o.   MRN: [id number] [facility] MD Progress Note  09/26/2014 2:56 PM Gary Osborn  MRN:  [id number] Subjective:  Patient states he is " the same". He continues to ruminate about a series of issues that his wife state have little basis in reality. These include his belief that he and his wife will lose everything and go to jail due to his omitting to mention some funds he has to his accountant when filing for taxes. He also worries about having written a book about [place] that could have offended people and possibly put his life in danger, and about his home insurance agency requesting he spend many thousands of dollars on his home to improve electrical grid. He also ruminates about his bowel habits and urination. States he can only use bathroom if in private and states he had been restricting fluid intake to use bath room less when he had a roommate. He now has a room to self so this is less of an issue.  He denies medication side effects, but feels " they are not doing much. Objective: Patient still focused on above issues , tends to ruminate repeatedly about these issues, and has difficulty changing to other topics. I have discussed case with Dr. Sabra Heck, who saw patient over the last few days, and with Vonna Kotyk, who came to see patient for possible EMS treatment. Patient has been encouraged to consider ECT or EMS but has refused. Today I had a family meeting with patient and wife x 30 minutes. Wife is supportive, states that he is much worse than baseline but that he has had several episodes as above, although this is " a bad one". Apparently these anxious ruminations have become more present over recent months. She corroborates a history of GOOD response and tolerance to ECT , which he had at [facility] during a previous admission. No disruptive behaviors on unit.  Diagnosis:   DSM5: Depressive Disorders:  Major Depressive Disorder - with  Psychotic Features (296.24) Total Time spent with patient: 35 minutes   Axis I: Generalized Anxiety Disorder  ADL's: fair   Sleep: good   Appetite:  Fair  Psychiatric Specialty Exam: Physical Exam  Review of Systems  Constitutional: Negative.   HENT: Negative.   Eyes: Negative.   Respiratory: Negative.   Cardiovascular: Negative.   Gastrointestinal: Positive for constipation.  Genitourinary: Negative.   Musculoskeletal: Negative.   Skin: Negative.   Neurological: Negative.   Endo/Heme/Allergies: Negative.   Psychiatric/Behavioral: Positive for depression. The patient is nervous/anxious.     Blood pressure 105/66, pulse 84, temperature 98 F (36.7 C), temperature source Oral, resp. rate 18, height 5' 8.75" (1.746 m), weight 76.204 kg (168 lb).Body mass index is 25 kg/(m^2).  General Appearance: Fairly Groomed  Engineer, water::  Good  Speech:  Clear and Coherent  Volume:  Normal  Mood:  Anxious, Depressed and worried  Affect:  anxious  Thought Process:  Coherent and Goal Directed  Orientation:  Full (Time, Place, and Person)  Thought Content:  Delusions- ruminations as above, no hallucinations and does not appear internally preoccupied   Suicidal Thoughts:  Makes statement that he will die before going to jail, due to his inability to urinate or go to the bathroom in a setting that is not private, but states he does not think he will go to jail until " after I file my taxes next year in April". Denies any current Suicidal  plan or intention   Homicidal Thoughts:  No  Memory: Recent and Remote grossly intact  Judgement:  Fair  Insight:  Lacking  Psychomotor Activity:  Normal  Concentration:  Fair  Recall:  [place]: Fair  Akathisia:  No  Handed:    AIMS (if indicated):     Assets:  Housing Social Support  Sleep:  Number of Hours: 6.75   Musculoskeletal: Strength & Muscle Tone: within normal limits Gait & Station: normal Patient leans:  N/A  Current Medications: Current Facility-Administered Medications  Medication Dose Route Frequency Provider Last Rate Last Dose  . acetaminophen (TYLENOL) tablet 650 mg  650 mg Oral Q6H PRN Shuvon Rankin, NP      . busPIRone (BUSPAR) tablet 10 mg  10 mg Oral TID Shuvon Rankin, NP   10 mg at 09/26/14 1151  . clonazePAM (KLONOPIN) tablet 0.5 mg  0.5 mg Oral TID Jenne Campus, MD   0.5 mg at 09/26/14 1151  . magnesium hydroxide (MILK OF MAGNESIA) suspension 30 mL  30 mL Oral Daily PRN Shuvon Rankin, NP   30 mL at 09/23/14 0818  . mirtazapine (REMERON) tablet 30 mg  30 mg Oral QHS Jenne Campus, MD   30 mg at 09/25/14 2111  . PARoxetine (PAXIL) tablet 40 mg  40 mg Oral QHS Shuvon Rankin, NP   40 mg at 09/25/14 2111  . pneumococcal 23 valent vaccine (PNU-IMMUNE) injection 0.5 mL  0.5 mL Intramuscular Tomorrow-1000 Myer Peer Vivian Okelley, MD   0.5 mL at 09/16/14 1519  . risperiDONE (RISPERDAL) tablet 1 mg  1 mg Oral q morning - 10a Jenne Campus, MD   1 mg at 09/26/14 1102  . risperiDONE (RISPERDAL) tablet 1.5 mg  1.5 mg Oral QHS Jenne Campus, MD   1.5 mg at 09/25/14 2111  . traZODone (DESYREL) tablet 50 mg  50 mg Oral QHS PRN Shuvon Rankin, NP        Lab Results: No results found for this or any previous visit (from the past 48 hour(s)).  Physical Findings: AIMS: Facial and Oral Movements Muscles of Facial Expression: None, normal Lips and Perioral Area: None, normal Jaw: None, normal Tongue: None, normal,Extremity Movements Upper (arms, wrists, hands, fingers): None, normal Lower (legs, knees, ankles, toes): None, normal, Trunk Movements Neck, shoulders, hips: None, normal, Overall Severity Severity of abnormal movements (highest score from questions above): None, normal Incapacitation due to abnormal movements: None, normal Patient's awareness of abnormal movements (rate only patient's report): No Awareness, Dental Status Current problems with teeth and/or dentures?: No Does  patient usually wear dentures?: No  CIWA:    COWS:      Assessment: Patient remains delusional, ruminative, anxious, somatic. Modest improvement from meds thus far, but no side effects either. History of good response to ECT as per family, although he doubts it helped much. Family meeting today- patient agreed to consider ECT /referral, which he had refused as an option recently. Not suicidal at this present time, and suicidal ideations are contingent upon his belief he will go to jail in April after filing taxes.  Treatment Plan Summary: Daily contact with patient to assess and evaluate symptoms and progress in treatment Medication management  Plan:  Continue inpatient treatment and support  Continue to encourage patient to consider ECT treatment as history of good response Increase Risperidone to 2 mgrs BID Increase Remeron to 45 mgrs QHS Continue Paxil 40 mgrs QHS  Continue Klonopin 0.5 mgrs TID  Will D/C Buspar, to simplify medication regimen and minimize drug- drug interactions   Medical Decision Making Problem Points:  Established problem, worsening (2), Review of last therapy session (1) and Review of psycho-social stressors (1) Data Points:  Review of medication regiment & side effects (2) Review of new medications or change in dosage (2)  I certify that inpatient services furnished can reasonably be expected to improve the patient's condition.   Dondre Catalfamo, [place] 09/26/2014, 2:56 PM

## 2014-09-26 NOTE — Tx Team (Signed)
Interdisciplinary Treatment Plan Update   Date Reviewed:  09/26/2014  Time Reviewed:  9:30 am  Progress in Treatment:   Attending groups: Yes, patient is attending group Participating in groups: Minimally Taking medication as prescribed: Yes  Tolerating medication: Yes Family/Significant other contact made:  Yes, patient has allowed MD to speak with wife Patient understands diagnosis: Yes, patient understands diagnosis and need for treatment. Discussing patient identified problems/goals with staff: Yes, patient is able to express goals for treatment and discharge. Medical problems stabilized or resolved: Yes Denies suicidal/homicidal ideation: Treatment team continuing to assess Patient has not harmed self or others: Yes  For review of initial/current patient goals, please see plan of care.  Estimated Length of Stay:  3-5 days  Reasons for Continued Hospitalization:  Anxiety Depression Medication stabilization   New Problems/Goals identified:    Discharge Plan or Barriers: Patient has declined [place] during course of hospitalization, despite that ECT treatments may have been beneficial in the past. [place] referral may be needed due to lack of improvement of symptoms.  Additional Comments:  Continue medication stabilization  Patient is a 80 year old Caucasian Male with a diagnosis of Major Depressive Disorder, Recurrent, Severe. Patient lives in [place] with his wife. Patient reports being hospitalized due to depression and a suicide attempt by drowning himself in a lake. Patient reports a prior attempt in January 2015 but did not elaborate. Patient appears to be worried about stressors that he did not wish to discuss with CSW. Patient was vague about his plans at discharge and states that he feels hopeless and that he has no future. Patient also states that he will not benefit from treatment here. CSW provided emotional support and encouragement. Patient will benefit from crisis  stabilization, medication evaluation, group therapy, and psycho education in addition to case management for discharge planning. Patient and CSW reviewed pt's identified goals and treatment plan. Pt verbalized understanding and agreed to treatment plan.   Attendees: Patient:    Family:    Physician: Dr. Parke Poisson; Dr. Sabra Heck 09/26/2014 9:30 AM  Nursing: Eduard Roux, Janann August, [address], [place], RN 09/26/2014 9:30 AM  Clinical Social Worker: Tilden Fossa, LCSWA 09/26/2014 9:30 AM  Other: Joette Catching, LCSW 09/26/2014 9:30 AM  Other: Lucinda Dell, [place] Sessions Liaison 09/26/2014 9:30 AM  Other: Agustina Caroli, NP 09/26/2014 9:30 AM  Other: Lars Pinks, Case Manager  09/26/2014 9:30 AM  Other: Beather Arbour 09/26/2014 9:30 AM  Other:    Other:       Tilden Fossa, MSW, [place] Worker [facility] [phone]

## 2014-09-26 NOTE — BHH Group Notes (Signed)
LATE ENTRY FROM 09/25/14:  [place] LCSW Group Therapy 09/25/14  1:15 pm  Type of Therapy: Group Therapy Participation Level: Active  Participation Quality: Attentive and Supportive  Affect: Depressed and Flat  Cognitive: Alert and Oriented  Insight: Developing/Improving and Engaged  Engagement in Therapy: Developing/Improving and Engaged  Modes of Intervention: Clarification, Confrontation, Discussion, Education, Exploration,  Limit-setting, Orientation, Problem-solving, Rapport Building, Art therapist, Socialization and Support  Summary of Progress/Problems: Pt identified obstacles faced currently and processed barriers involved in overcoming these obstacles. Pt identified steps necessary for overcoming these obstacles and explored motivation (internal and external) for facing these difficulties head on. Pt further identified one area of concern in their lives and chose a goal to focus on for today. Patient actively listened during group but declined to share in discussion.  Tilden Fossa, MSW, [place] Worker [facility] [phone]

## 2014-09-26 NOTE — Progress Notes (Signed)
Follow up with pt for continued presence and assess for support.   Gary Osborn was lying on bed in room.  Welcoming of chaplain presence.  Was anxious about "what might happen next," stating "I'm afraid that what is coming is much worse than this place."  Was not specific about fears.   Understand from MD notes that pt's anxiety / ruminations have little basis in reality.  Chaplain offered calming presence and worked to help Gary Osborn orient toward present, needs on unit, support and relationships around him.     [place], [place]

## 2014-09-26 NOTE — BHH Group Notes (Addendum)
LATE ENTRY FROM 09/25/14:    [facility] LCSW Aftercare Discharge Planning Group Note  09/25/14  8:45 AM   Participation Quality: Alert, Appropriate and Oriented  Mood/Affect: Depressed and Flat  Depression Rating: 10  Anxiety Rating: 8  Thoughts of Suicide: Pt denies SI/HI  Will you contract for safety? Yes  Current AVH: Pt denies  Plan for Discharge/Comments: Pt attended discharge planning group and actively participated in group. CSW provided pt with today's workbook. Patient reports feeling "in the middle of the road" today. He is unsure of his discharge plans at this time. Patient continues to endorse high levels of depression and anxiety.  Transportation Means: Pt reports access to transportation  Supports: No supports mentioned at this time  Tilden Fossa, MSW, [place] Social Worker [employer] [phone]

## 2014-09-26 NOTE — Progress Notes (Signed)
Patient ID: Gary Osborn, male   DOB: Jun 23, 1933, 80 y.o.   MRN: [id number] PER STATE REGULATIONS 482.30  THIS CHART WAS REVIEWED FOR MEDICAL NECESSITY WITH RESPECT TO THE PATIENT'S ADMISSION/ DURATION OF STAY.  NEXT REVIEW DATE: 09/30/2014  Chauncy Lean, RN, BSN CASE MANAGER

## 2014-09-26 NOTE — BHH Group Notes (Signed)
[place] LCSW Group Therapy  09/26/2014   1:15 PM   Type of Therapy:  Group Therapy  Participation Level:  Active  Participation Quality:  Attentive, Sharing and Supportive  Affect:  Depressed and Flat  Cognitive:  Alert and Oriented  Insight:  Developing/Improving and Engaged  Engagement in Therapy:  Developing/Improving and Engaged  Modes of Intervention:  Clarification, Confrontation, Discussion, Education, Exploration, Limit-setting, Orientation, Problem-solving, Rapport Building, Art therapist, Socialization and Support  Summary of Progress/Problems: The topic for group therapy was feelings about diagnosis.  Pt actively participated in group discussion on their past and current diagnosis and how they feel towards this.  Pt also identified how society and family members judge them, based on their diagnosis as well as stereotypes and stigmas.  Patient discussed that traumas can often trigger mental health issues. CSW's and other group members provided emotional support and encouragement.  Tilden Fossa, MSW, [place] Worker [facility] [phone]

## 2014-09-26 NOTE — Progress Notes (Signed)
Patient ID: Gary Osborn, male   DOB: 08-Jul-1933, 80 y.o.   MRN: [id number]  D: Pt currently presents with a flat affect and pleasant behavior. Per self inventory, pt rates depression at a 10, hopelessness 10 and anxiety 10. Pt's daily goal is to "improve" and they intend to do so by "try harder." Pt reports good sleep, poor concentration but a good appetite. Pt attends groups, sits in the dayroom and converses with other pt's. Pt has a consistent mood. Pt has mild confusion about home finances and situation with his family.    A: Pt provided with medications per providers orders. Pt's labs and vitals were monitored throughout the day. Pt supported emotionally and encouraged to express concerns and questions. Pt consulted with provider and Probation officer. Pt educated on unit rules and anxiety management.    R: Pt's safety ensured with 15 minute and environmental checks. Pt currently denies SI/HI and A/V hallucinations. Pt verbally agrees to seek staff if SI/HI or A/VH occurs and to consult with staff before acting on these thoughts.    Elenore Rota, RN

## 2014-09-27 MED ORDER — MAGNESIUM CITRATE PO SOLN: 1.0000 | Freq: Once | ORAL | Status: DC

## 2014-09-27 MED ORDER — MAGNESIUM CITRATE PO SOLN
1.0000 | Freq: Once | ORAL | Status: AC
  Administered 2014-09-27: 1 via ORAL

## 2014-09-27 NOTE — Progress Notes (Signed)
Pt continues to be focused on his bowels and is requesting medication to help him have a bowel movement.  Writer asked pt when his last BM was and he admitted that he had "a small one" today and yesterday.  Writer informed pt that as long as they were moving each day, then he may just need a stool softener.  Pt was insistent that he needed something of prescription strength, because an over the counter medication would not help him.  Writer spoke with PA who gave order for Lactulose prn.  When medication became available, pt said he wanted to wait until tomorrow.  Pt seems anxious and fixated on his somatic complaints.  He is otherwise cooperative.  Pt denies SI/HI/AV at this time.  Pt has been going to groups, but says he does not attend the AA or NA, because they do not pertain to him.  Support and encouragement offered.  Safety maintained with q15 minute checks.

## 2014-09-27 NOTE — Progress Notes (Addendum)
Patient ID: ASHANTE SNELLING, male   DOB: 1933/06/07, 80 y.o.   MRN: [id number]  Pt currently presents with a flat affect and depressed behavior. Per self inventory, pt rates depression at a 9, hopelessness 9 and anxiety 9. Pt's daily goal is to "improve" and they intend to do so by "try harder." Pt reports good sleep, poor concentration and a good appetite.   Pt provided with medications per providers orders. Pt's labs and vitals were monitored throughout the day. Pt supported emotionally and encouraged to express concerns and questions. Pt consulted with provider and Probation officer. Pt educated on stress managment. Magnesium citrate ordered for pt. 2 Milk of magnesia and 1 lactulose did not produce a bowel movement.   Pt's safety ensured with 15 minute and environmental checks. Pt currently denies SI/HI and A/V hallucinations. Pt verbally agrees to seek staff if SI/HI or A/VH occurs and to consult with staff before acting on these thoughts. Pt has obsessions on bowel movements and constipation. Pt told to not flush, notify the nurse and allow the nurse to look in the commode, before flushing. Pt's family visited the unit tonight. Family seemed to be supportive of his progress.  Elenore Rota, RN

## 2014-09-27 NOTE — Clinical Social Work Note (Signed)
CSW met with supervisor Edwyna Shell who reports that patient may be accepted to [facility] tomorrow 09/28/14 to begin ECT treatment pending bed availability.   CSW attempted to inform MD, but MD out of office at this time. CSW to update MD during morning progression rounds tomorrow.   CSW met with patient to inform him of possible bed availability at [facility] to begin ECT treatments. CSW answered patient's questions and addressed his concerns. Patient is concerned if he is unable to have a private room due to difficulty using the restroom around others. CSW informed patient that it is uncertain whether a private room is available at this time.   CSW contacted [place] assessment staff [place] regarding possibility of private room. Per Kerry Dory, CSW will need to contact him tomorrow 09/27/14 after 9 am to inquire about bed availability. CSW will continue to follow.  Tilden Fossa, MSW, [place] Worker [facility] [phone]

## 2014-09-27 NOTE — BH Assessment (Signed)
Patient referral faxed to [facility], spoke with Kerry Dory [phone])

## 2014-09-27 NOTE — Clinical Social Work Note (Signed)
CSW left voicemail for [facility] ECT department [phone] regarding referral process. CSW awaiting return call.  Tilden Fossa, MSW, [place] Worker [facility] [phone]

## 2014-09-27 NOTE — BHH Group Notes (Signed)
Adult Psychoeducational Group Note  Date:  09/27/2014 Time:  5:53 AM  Group Topic/Focus:  Wrap-Up Group  Participation Level:  Minimal  Participation Quality:  Appropriate  Affect:  Depressed and Flat  Cognitive:  Appropriate  Insight: Lacking  Engagement in Group:  Limited  Modes of Intervention:  Discussion and Support  Additional Comments:  Pt stated that he did not have a very good day and then said, "and I'll just end with that".  Said that he will be talking in MD about some things he would like to get started but did not know if they would get done or even help.  Encouragement and support given.  Rick Duff [place] 09/27/2014, 5:53 AM

## 2014-09-27 NOTE — Progress Notes (Signed)
Patient ID: Gary Osborn, male   DOB: 04/22/1933, 80 y.o.   MRN: [id number] [facility] MD Progress Note  09/27/2014 12:33 PM JAREB RADONCIC  MRN:  [id number] Subjective:  Patient states he is " doing the same". Objective: I have discussed case with treatment team and met with patient. As noted, we recently had a family meeting with his wife. Wife corroborated that patient did very well with ECT course in the past. Patient initially stated that it did not work for him, but has now stated that " it did help, but only for a period of time". Initially he had stated that he would not agree to get ECT again, but at this time he states that he would consider it.  Although still ruminative and preoccupied , he is presenting with a fuller range of affect today, smiling appropriately at times during session. He is tolerating medications well- states he had felt slightly sedated yesterday but denies any side effects at present and he presents fully alert, attentive,  With stable gait. Visible in day room, going to groups. Ruminations continue to encompass concern about going to jail after April, inability to urinate or defecate in jail as bathrooms there not private, concerns that his wife will end up  Being homeless/destitute.  He seems less focused on bowel habits today than he has been, and states he has been able to " use the bathroom in my room".    Diagnosis:   DSM5: Depressive Disorders:  Major Depressive Disorder - with Psychotic Features (296.24) Total Time spent with patient: 25 minutes  Axis I: Generalized Anxiety Disorder  ADL's: fair   Sleep: good   Appetite:  Fair  Psychiatric Specialty Exam: Physical Exam  Review of Systems  Constitutional: Negative.   HENT: Negative.   Eyes: Negative.   Respiratory: Negative.   Cardiovascular: Negative.   Gastrointestinal: Positive for constipation.  Genitourinary: Negative.   Musculoskeletal: Negative.   Skin: Negative.   Neurological: Negative.    Endo/Heme/Allergies: Negative.   Psychiatric/Behavioral: Positive for depression. The patient is nervous/anxious.     Blood pressure 104/71, pulse 77, temperature 98.9 F (37.2 C), temperature source Oral, resp. rate 20, height 5' 8.75" (1.746 m), weight 76.204 kg (168 lb).Body mass index is 25 kg/(m^2).  General Appearance: Fairly Groomed  Engineer, water::  Good  Speech:  Clear and Coherent  Volume:  Normal  Mood:  remains anxious, but mood seems partially improved and he seems less severely depressed today, with a fuller range of affect  Affect:  anxious- but some improvement noted   Thought Process:  Coherent and Goal Directed  Orientation:  Full (Time, Place, and Person)  Thought Content:  Delusions- ruminations as above, no hallucinations and does not appear internally preoccupied   Suicidal Thoughts: No immediate SI and able to contract for safety on unit. Ruminates about death /dying regarding his perception that he will be incarcerated in a few months  Homicidal Thoughts:  No  Memory: Recent and Remote grossly intact  Judgement:  Fair  Insight:  Lacking  Psychomotor Activity:  Normal  Concentration:  Fair  Recall:  [place]: Fair  Akathisia:  No  Handed:    AIMS (if indicated):     Assets:  Housing Social Support  Sleep:  Number of Hours: 6.25   Musculoskeletal: Strength & Muscle Tone: within normal limits Gait & Station: normal Patient leans: N/A  Current Medications: Current Facility-Administered Medications  Medication Dose Route Frequency  Provider Last Rate Last Dose  . acetaminophen (TYLENOL) tablet 650 mg  650 mg Oral Q6H PRN Shuvon Rankin, NP      . clonazePAM (KLONOPIN) tablet 0.5 mg  0.5 mg Oral TID Jenne Campus, MD   0.5 mg at 09/27/14 0738  . lactulose (CHRONULAC) 10 GM/15ML solution 20 g  20 g Oral BID PRN Laverle Hobby, PA-C   20 g at 09/27/14 0740  . magnesium hydroxide (MILK OF MAGNESIA) suspension 30 mL  30 mL Oral  Daily PRN Shuvon Rankin, NP   30 mL at 09/23/14 0818  . mirtazapine (REMERON) tablet 45 mg  45 mg Oral QHS Jenne Campus, MD   45 mg at 09/26/14 2124  . PARoxetine (PAXIL) tablet 40 mg  40 mg Oral QHS Shuvon Rankin, NP   40 mg at 09/26/14 2124  . pneumococcal 23 valent vaccine (PNU-IMMUNE) injection 0.5 mL  0.5 mL Intramuscular Tomorrow-1000 Myer Peer Aleda Madl, MD   0.5 mL at 09/16/14 1519  . risperiDONE (RISPERDAL) tablet 2 mg  2 mg Oral BID Jenne Campus, MD   2 mg at 09/27/14 0739  . traZODone (DESYREL) tablet 50 mg  50 mg Oral QHS PRN Shuvon Rankin, NP        Lab Results: No results found for this or any previous visit (from the past 48 hour(s)).  Physical Findings: AIMS: Facial and Oral Movements Muscles of Facial Expression: None, normal Lips and Perioral Area: None, normal Jaw: None, normal Tongue: None, normal,Extremity Movements Upper (arms, wrists, hands, fingers): None, normal Lower (legs, knees, ankles, toes): None, normal, Trunk Movements Neck, shoulders, hips: None, normal, Overall Severity Severity of abnormal movements (highest score from questions above): None, normal Incapacitation due to abnormal movements: None, normal Patient's awareness of abnormal movements (rate only patient's report): No Awareness, Dental Status Current problems with teeth and/or dentures?: No Does patient usually wear dentures?: No  CIWA:    COWS:      Assessment: Although still ruminative and anxious, mood seems partially improved, and range of affect is less constricted. Tolerating medications well thus far- they have been gradually titrated. Of note, patient is today acknowledging that ECT did help him and  More open to consider/ agree to this option than he has been.  Treatment Plan Summary: Daily contact with patient to assess and evaluate symptoms and progress in treatment Medication management  Plan:  Continue inpatient treatment and support  With patient's consent treatment  team will contact [facility]/ Dr. Sebastian Ache to assess potential of ECT  Treatment course there.  Risperidone to 2 mgrs BID Remeron to 45 mgrs QHS Paxil 40 mgrs QHS  Klonopin 0.5 mgrs TID    Medical Decision Making Problem Points:  Established problem, worsening (2), Review of last therapy session (1) and Review of psycho-social stressors (1) Data Points:  Review of medication regiment & side effects (2)  I certify that inpatient services furnished can reasonably be expected to improve the patient's condition.   Annielee Jemmott, Felicita Gage 09/27/2014, 12:33 PM

## 2014-09-27 NOTE — BHH Group Notes (Signed)
[place] LCSW Group Therapy 09/27/2014  1:15 PM Type of Therapy: Group Therapy Participation Level: Active  Participation Quality: Attentive, Sharing and Supportive  Affect: Depressed and Flat  Cognitive: Alert and Oriented  Insight: Developing/Improving and Engaged  Engagement in Therapy: Developing/Improving and Engaged  Modes of Intervention: Clarification, Confrontation, Discussion, Education, Exploration, Limit-setting, Orientation, Problem-solving, Rapport Building, Art therapist, Socialization and Support  Summary of Progress/Problems: The topic for group today was emotional regulation. This group focused on both positive and negative emotion identification and allowed group members to process ways to identify feelings, regulate negative emotions, and find healthy ways to manage internal/external emotions. Group members were asked to reflect on a time when their reaction to an emotion led to a negative outcome and explored how alternative responses using emotion regulation would have benefited them. Group members were also asked to discuss a time when emotion regulation was utilized when a negative emotion was experienced. Patient discussed feeling discouraged and hopeless regarding his financial situation and relates it to falling to the bottom of a volcano. Other CSW provided patient with encouragement to seek expert opinions on his financial situation in hopes that he may be catastrophizing his situation and allow him to more realistically evaluate the stressors that precipitated his hospitalization. CSW's and other group members provided emotional support and encouragement.  Tilden Fossa, MSW, [place] Worker [facility] [phone]

## 2014-09-27 NOTE — Plan of Care (Signed)
Problem: Alteration in mood Goal: LTG-Patient reports reduction in suicidal thoughts (Patient reports reduction in suicidal thoughts and is able to verbalize a safety plan for whenever patient is feeling suicidal)  Outcome: Progressing Pt currently denies SI.

## 2014-09-27 NOTE — BHH Group Notes (Signed)
   [facility] LCSW Aftercare Discharge Planning Group Note  09/27/2014  8:45 AM   Participation Quality: Alert, Appropriate and Oriented  Mood/Affect: Depressed and Flat  Depression Rating: 10  Anxiety Rating: 10  Thoughts of Suicide: Pt denies SI/HI  Will you contract for safety? Yes  Current AVH: Pt denies  Plan for Discharge/Comments: Pt attended discharge planning group and actively participated in group. CSW provided pt with today's workbook.  Discharge plans are uncertain at this time. Patient remains vague and guarded about presenting problems and his aftercare.  Transportation Means: Pt reports access to transportation  Supports: Patient has identified his wife as a support.   Tilden Fossa, MSW, [place] Worker [facility] [phone]

## 2014-09-27 NOTE — Clinical Social Work Note (Signed)
CSW checked patient's chart to confirm voluntary status. No IVC paperwork found in chart, patient has signed voluntary admission consent form on 09/13/14.   CSW met with patient to discuss [place] referral and get consent to release information. Patient reports that he does not feel that an extended hospital stay at [facility] will be beneficial to him, as his depression is related to outside stressors. Patient declined to consent to [facility] referral at this time. Patient disclosed with CSW for the first time in detail his concerns regarding finances, legal issues, incarceration, and difficulty using the restroom around others. CSW provided patient with emotional support and encouragement. Patient also discussed concerns that ECT may not be effective for him long-term. He believes that it may cause "short term amnesia".   Tilden Fossa, MSW, [place] Worker [facility] [phone]

## 2014-09-28 ENCOUNTER — Inpatient Hospital Stay: Payer: Self-pay | Admitting: Psychiatry

## 2014-09-28 DIAGNOSIS — R9431 Abnormal electrocardiogram [ECG] [EKG]: Secondary | ICD-10-CM | POA: Diagnosis not present

## 2014-09-28 DIAGNOSIS — G4700 Insomnia, unspecified: Secondary | ICD-10-CM | POA: Diagnosis not present

## 2014-09-28 DIAGNOSIS — F333 Major depressive disorder, recurrent, severe with psychotic symptoms: Secondary | ICD-10-CM | POA: Diagnosis not present

## 2014-09-28 DIAGNOSIS — F322 Major depressive disorder, single episode, severe without psychotic features: Secondary | ICD-10-CM | POA: Diagnosis not present

## 2014-09-28 DIAGNOSIS — Z915 Personal history of self-harm: Secondary | ICD-10-CM | POA: Diagnosis not present

## 2014-09-28 DIAGNOSIS — F339 Major depressive disorder, recurrent, unspecified: Secondary | ICD-10-CM | POA: Diagnosis not present

## 2014-09-28 DIAGNOSIS — F411 Generalized anxiety disorder: Secondary | ICD-10-CM | POA: Diagnosis not present

## 2014-09-28 DIAGNOSIS — F42 Obsessive-compulsive disorder: Secondary | ICD-10-CM | POA: Diagnosis not present

## 2014-09-28 DIAGNOSIS — F05 Delirium due to known physiological condition: Secondary | ICD-10-CM | POA: Diagnosis not present

## 2014-09-28 DIAGNOSIS — Z720 Tobacco use: Secondary | ICD-10-CM | POA: Diagnosis not present

## 2014-09-28 DIAGNOSIS — Z818 Family history of other mental and behavioral disorders: Secondary | ICD-10-CM | POA: Diagnosis not present

## 2014-09-28 DIAGNOSIS — R45851 Suicidal ideations: Secondary | ICD-10-CM | POA: Diagnosis not present

## 2014-09-28 DIAGNOSIS — Z8546 Personal history of malignant neoplasm of prostate: Secondary | ICD-10-CM | POA: Diagnosis not present

## 2014-09-28 DIAGNOSIS — F3340 Major depressive disorder, recurrent, in remission, unspecified: Secondary | ICD-10-CM | POA: Diagnosis not present

## 2014-09-28 MED ORDER — PAROXETINE HCL 40 MG PO TABS: 40.0000 mg | ORAL_TABLET | Freq: Every day | ORAL | Status: AC

## 2014-09-28 MED ORDER — MIRTAZAPINE 45 MG PO TABS: 45.0000 mg | ORAL_TABLET | Freq: Every day | ORAL | Status: DC

## 2014-09-28 MED ORDER — RISPERIDONE 2 MG PO TABS: 2.0000 mg | ORAL_TABLET | Freq: Two times a day (BID) | ORAL | Status: DC

## 2014-09-28 MED ORDER — TRAZODONE HCL 50 MG PO TABS: 50.0000 mg | ORAL_TABLET | Freq: Every evening | ORAL | Status: DC | PRN

## 2014-09-28 MED ORDER — CLONAZEPAM 0.5 MG PO TABS: 0.5000 mg | ORAL_TABLET | Freq: Three times a day (TID) | ORAL | Status: DC

## 2014-09-28 MED ORDER — LACTULOSE 10 GM/15ML PO SOLN: 20.0000 g | Freq: Two times a day (BID) | ORAL | Status: DC | PRN

## 2014-09-28 NOTE — Progress Notes (Signed)
Pt is still focused on his bowel function and began our conversation tonight by telling writer that he has not had a BM today and that he had multiple laxatives including Magnesium Citrate which had not worked.  The previous RN reported that the pt had received the Mag Cit just prior to the shift change, so writer encourage the pt to give the medication time to work and also encouraged him to drink plenty of water this evening.  Pt denies SI/HI/AV.  He declined to discuss any thing else other than his GI issues.  Pt was again reminded to inform staff when he had a BM so that it could be observed by staff and documented.  Pt voiced understanding.  Support and encouragement offered.  Pt makes his needs known to staff.  Safety maintained with q15 minute checks.

## 2014-09-28 NOTE — BHH Suicide Risk Assessment (Signed)
[place] INPATIENT:  Family/Significant Other Suicide Prevention Education  Suicide Prevention Education:  Patient Discharged to [place]:  Suicide Prevention Education Not Provided: {PT. DISCHARGED TO OTHER HEALTHCARE FACILITY:SUICIDE PREVENTION EDUCATION NOT PROVIDED ([facility]):  The patient is discharging to another healthcare facility for continuation of treatment.  The patient's medical information, including suicide ideations and risk factors, are a part of the medical information shared with the receiving healthcare facility. SPE has been reviewed with patient multiple times during admission.  Gary Osborn, Casimiro Needle 09/28/2014, 3:12 PM

## 2014-09-28 NOTE — Clinical Social Work Note (Signed)
CSW spoke with Kerry Dory at [facility] Assessment who reports that bed is available for patient today. CSW updated RN and NP, as MD in session with patient.  Tilden Fossa, MSW, [place] Worker [facility] [phone]

## 2014-09-28 NOTE — Progress Notes (Addendum)
Patient ID: Gary Osborn, male   DOB: Mar 12, 1933, 80 y.o.   MRN: [id number]  Pt. Denies SI/HI and A/V hallucinations to this Probation officer. Belongings returned to patient at time of discharge. Patient denies any pain or discomfort. Discharge instructions and medications were reviewed with patient. Patient verbalized understanding of both medications and discharge instructions. Patient was discharged to [facility] and [facility] picked him up. Q15 minute safety checks maintained until discharge. No distress noted upon discharge.

## 2014-09-28 NOTE — BHH Group Notes (Signed)
[place] Group Notes:  (Nursing/MHT/Case Management/Adjunct)  Date:  09/28/2014  Time:  10:14 AM  Type of Therapy:  Nurse Education  Participation Level:  Minimal  Participation Quality:  Appropriate  Affect:  Blunted  Cognitive:  Appropriate  Insight:  Appropriate  Engagement in Group:  Engaged  Modes of Intervention:  Discussion and Education  Summary of Progress/Problems: The focus of this group was to discuss leisure and lifestyle changes as it pertains to and aids in patient's recovery. The patient reports his leisure activity watching Vicente Serene show.   Marialy Urbanczyk E 09/28/2014, 10:14 AM

## 2014-09-28 NOTE — Clinical Social Work Note (Signed)
Treatment team and patient informed of patient's discharge to [facility] BMU today. RN has called report to [facility] and contacted [facility] to transport patient. CSW signing off, no further social work needs.   Tilden Fossa, MSW, [place] Worker [facility] [phone]

## 2014-09-28 NOTE — BHH Suicide Risk Assessment (Signed)
Demographic Factors:  80 year old married male, lives at home with wife  Total Time spent with patient: 30 minutes  Psychiatric Specialty Exam: Physical Exam  ROS  Blood pressure 129/86, pulse 82, temperature 97.9 F (36.6 C), temperature source Oral, resp. rate 16, height 5' 8.75" (1.746 m), weight 76.204 kg (168 lb).Body mass index is 25 kg/(m^2).  General Appearance: Fairly Groomed  Engineer, water::  Good  Speech:  Normal Rate  Volume:  Normal  Mood:  depressed, anxious, although improved partially compared to admission  Affect:  anxious  Thought Process:  Linear- tends to perseverate on his ruminations  Orientation:  Full (Time, Place, and Person)  Thought Content:  Rumination and denies hallucinations. Ruminations concern his belief he has missed including a savings account or similar to his acoountant, which will result in his being incarcerated in April 2016, and will leave his wife destitute. He has other less intense ruminations as well. He has significant somatic concerns regarding size of genitals and concerns about bowel movements/constipation  Suicidal Thoughts:  No- no current plan or intention of hurting self but does state he would rather die than go to jail due to his difficulties regarding urinating in public restrooms.   Homicidal Thoughts:  No  Memory:  recent and remote grossly intact   Judgement:  Impaired  Insight:  Lacking  Psychomotor Activity:  Normal  Concentration:  Good  Recall:  Good  Fund of Knowledge:Good  Language: Good  Akathisia:  Negative  Handed:  Right  AIMS (if indicated):     Assets:  Desire for Improvement Resilience Social Support  Sleep:  Number of Hours: 6.75    Musculoskeletal: Strength & Muscle Tone: within normal limits Gait & Station: normal Patient leans: Backward   Mental Status Per Nursing Assessment::   On Admission:     Current Mental Status by Physician: As noted, above, patient has made some improvement compared  to admission and mood seems less severely depressed and affect less constricted. However, improvement has been relatively modest and partial, and as noted above, patient continues to ruminate almost constantly as above, remains somatically focused and pessimistic about future. Not currently acutely suicidal. Denies hallucinations  Loss Factors: inability to function at premorbid level of functioning  Historical Factors: History of depression, history of obsessive ruminations, history of prior suicide attempts   Risk Reduction Factors:   Sense of responsibility to family, Living with another person, especially a relative and Positive coping skills or problem solving skills  Continued Clinical Symptoms:  Ongoing anxiety, ruminations, and thoughts of death as described above   Cognitive Features That Contribute To Risk:  Polarized thinking    Suicide Risk:  Moderate:  Frequent suicidal ideation with limited intensity, and duration, some specificity in terms of plans, no associated intent, good self-control, limited dysphoria/symptomatology, some risk factors present, and identifiable protective factors, including available and accessible social support.  Discharge Diagnoses:   AXIS I:  MDD with psychotic features  AXIS II:  Deferred AXIS III:   Past Medical History  Diagnosis Date  . ALLERGIC RHINITIS 10/05/2007  . ANXIETY 10/05/2007  . COLONIC POLYPS, HX OF 10/05/2007  . DEPRESSION 10/04/2008  . PROSTATE CANCER, UNSPEC. 10/05/2007  . SINUS BRADYCARDIA 10/04/2008   AXIS IV:  problems related to social environment and inability to function at premorbid level of functioning AXIS V:  41-50 serious symptoms  Plan Of Care/Follow-up recommendations:  Activity:  As tolerated Diet:  Regular Tests:  NA Other:  See below  Is patient on multiple antipsychotic therapies at discharge:  No   Has Patient had three or more failed trials of antipsychotic monotherapy by history:   No  Recommended Plan for Multiple Antipsychotic Therapies: NA  Patient has a history of good response to ECT . He is being transferred to [place] where he can initiate ECT treatment which is considered appropriate/optimal type of treatment for him at this time. Patient in agreement with above plan, and wife aware and in agreement as well.    COBOS, [place] 09/28/2014, 3:22 PM

## 2014-09-28 NOTE — Clinical Social Work Note (Signed)
CSW updated MD, who is agreeable to patient's transfer to [facility] today for evaluation of ECT treatments.  CSW contacted patient's wife Victorino Fatzinger [phone] to inform her. She reports that she is in agreement with plan and has no further questions.  Tilden Fossa, MSW, [place] Worker [facility] [phone]

## 2014-09-28 NOTE — Progress Notes (Signed)
[facility] Adult Case Management Discharge Plan :  Will you be returning to the same living situation after discharge: No. Patient will transfer to [facility] for evaluation of appropriateness for ECT treatment.  At discharge, do you have transportation home?:Yes,  Patient will be transported by [employer] you have the ability to pay for your medications:Yes,  patient will be provided with prescriptions and verbalizes his ability to obtain medications.  Release of information consent forms completed and in the chart;  Patient's signature needed at discharge.  Patient to Follow up at: Follow-up Information    Follow up with [facility] On 09/28/2014.   Why:  Transfer to [facility] for evaluation for ECT treatments.   Contact information:   [place],  [phone]      Patient denies SI/HI:   Yes,  denies    Safety Planning and Suicide Prevention discussed:  Yes,  with patient  N/A patient is not a smoker  Jasdeep Kepner, Casimiro Needle 09/28/2014, 3:15 PM

## 2014-09-28 NOTE — Plan of Care (Signed)
Problem: Ineffective individual coping Goal: STG-Increase in ability to manage activities of daily living Outcome: Completed/Met Date Met:  09/28/14 Patient is able to complete ADL's for developmental age. Patient is seen washing his clothes, making his bed, changing clothes, etc.

## 2014-09-28 NOTE — Clinical Social Work Note (Addendum)
Per Kerry Dory in Assessment at [facility], bed availability at this time is unclear. Calvin agreed to contact CSW regarding bed availability later today. CSW continuing to follow.  MD and RN updated about possible bed availability at this time.  Tilden Fossa, MSW, [place] Worker [facility] [phone]

## 2014-09-28 NOTE — BHH Group Notes (Signed)
[place] LCSW Group Therapy 09/28/2014  1:15 PM   Type of Therapy: Group Therapy  Participation Level: Did Not Attend. Patient speaking with MD during start of group.   Tilden Fossa, MSW, [place] Worker [facility] [phone]
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
## 2014-09-29 LAB — BEHAVIORAL MEDICINE 1 PANEL
ALK PHOS: 89 U/L
Albumin: 2.9 g/dL — ABNORMAL LOW (ref 3.4–5.0)
Anion Gap: 6 — ABNORMAL LOW (ref 7–16)
BUN: 13 mg/dL (ref 7–18)
Basophil #: 0 10*3/uL (ref 0.0–0.1)
Basophil %: 1.1 %
Bilirubin,Total: 0.3 mg/dL (ref 0.2–1.0)
CHLORIDE: 109 mmol/L — AB (ref 98–107)
CREATININE: 0.91 mg/dL (ref 0.60–1.30)
Calcium, Total: 8 mg/dL — ABNORMAL LOW (ref 8.5–10.1)
Co2: 28 mmol/L (ref 21–32)
EGFR (Non-African Amer.): >60
EOS ABS: 0.1 10*3/uL (ref 0.0–0.7)
Eosinophil %: 2.6 %
Glucose: 84 mg/dL (ref 65–99)
HCT: 37.8 % — AB (ref 40.0–52.0)
HGB: 12.4 g/dL — ABNORMAL LOW (ref 13.0–18.0)
LYMPHS ABS: 0.7 10*3/uL — AB (ref 1.0–3.6)
Lymphocyte %: 23 %
MCH: 33 pg (ref 26.0–34.0)
MCHC: 33 g/dL (ref 32.0–36.0)
MCV: 100 fL (ref 80–100)
MONO ABS: 0.4 x10 3/mm (ref 0.2–1.0)
Monocyte %: 12.2 %
Neutrophil #: 1.9 10*3/uL (ref 1.4–6.5)
Neutrophil %: 61.1 %
Osmolality: 284 (ref 275–301)
POTASSIUM: 4.3 mmol/L (ref 3.5–5.1)
Platelet: 123 10*3/uL — ABNORMAL LOW (ref 150–440)
RBC: 3.77 10*6/uL — ABNORMAL LOW (ref 4.40–5.90)
RDW: 13.2 % (ref 11.5–14.5)
SGOT(AST): 23 U/L (ref 15–37)
SGPT (ALT): 37 U/L
SODIUM: 143 mmol/L (ref 136–145)
THYROID STIMULATING HORM: 3.3 u[IU]/mL
Total Protein: 5.3 g/dL — ABNORMAL LOW (ref 6.4–8.2)
WBC: 3.1 10*3/uL — ABNORMAL LOW (ref 3.8–10.6)
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
## 2014-09-30 LAB — URINALYSIS, COMPLETE
BLOOD: NEGATIVE
Bilirubin,UR: NEGATIVE
Glucose,UR: NEGATIVE mg/dL (ref 0–75)
Hyaline Cast: 2
KETONE: NEGATIVE
Leukocyte Esterase: NEGATIVE
Nitrite: NEGATIVE
PH: 5 (ref 4.5–8.0)
PROTEIN: NEGATIVE
RBC,UR: NONE SEEN /HPF (ref 0–5)
SPECIFIC GRAVITY: 1.019 (ref 1.003–1.030)
Squamous Epithelial: <1
WBC UR: 1 /HPF (ref 0–5)

## 2014-10-02 ENCOUNTER — Ambulatory Visit: Payer: Self-pay | Admitting: Gastroenterology

## 2014-10-02 NOTE — Progress Notes (Signed)
Patient Discharge Instructions:  Next Level Care Provider Has Access to the EMR, 10/02/14  Records provided to [facility] via [facility]/Epic access.  Patsey Berthold, 10/02/2014, 3:57 PM

## 2014-10-16 ENCOUNTER — Encounter: Payer: Medicare Other | Admitting: Internal Medicine

## 2014-10-21 LAB — CBC WITH DIFFERENTIAL/PLATELET
Basophil #: 0 10*3/uL (ref 0.0–0.1)
Basophil %: 0.9 %
EOS PCT: 4 %
Eosinophil #: 0.1 10*3/uL (ref 0.0–0.7)
HCT: 40.8 % (ref 40.0–52.0)
HGB: 13.4 g/dL (ref 13.0–18.0)
Lymphocyte #: 0.9 10*3/uL — ABNORMAL LOW (ref 1.0–3.6)
Lymphocyte %: 25.2 %
MCH: 32.9 pg (ref 26.0–34.0)
MCHC: 32.8 g/dL (ref 32.0–36.0)
MCV: 100 fL (ref 80–100)
MONO ABS: 0.4 x10 3/mm (ref 0.2–1.0)
MONOS PCT: 12 %
NEUTROS ABS: 2 10*3/uL (ref 1.4–6.5)
Neutrophil %: 57.9 %
PLATELETS: 136 10*3/uL — AB (ref 150–440)
RBC: 4.07 10*6/uL — AB (ref 4.40–5.90)
RDW: 13.5 % (ref 11.5–14.5)
WBC: 3.5 10*3/uL — AB (ref 3.8–10.6)

## 2014-10-21 LAB — URINALYSIS, COMPLETE
Bacteria: NONE SEEN
Bilirubin,UR: NEGATIVE
Blood: NEGATIVE
Glucose,UR: NEGATIVE mg/dL (ref 0–75)
Hyaline Cast: 2
Ketone: NEGATIVE
Leukocyte Esterase: NEGATIVE
NITRITE: NEGATIVE
PROTEIN: NEGATIVE
Ph: 6 (ref 4.5–8.0)
RBC,UR: <1 /HPF (ref 0–5)
Specific Gravity: 1.018 (ref 1.003–1.030)
Squamous Epithelial: <1

## 2014-10-21 LAB — BASIC METABOLIC PANEL
ANION GAP: 4 — AB (ref 7–16)
BUN: 18 mg/dL (ref 7–18)
Calcium, Total: 8.8 mg/dL (ref 8.5–10.1)
Chloride: 107 mmol/L (ref 98–107)
Co2: 33 mmol/L — ABNORMAL HIGH (ref 21–32)
Creatinine: 1.06 mg/dL (ref 0.60–1.30)
GLUCOSE: 81 mg/dL (ref 65–99)
OSMOLALITY: 288 (ref 275–301)
Potassium: 4.3 mmol/L (ref 3.5–5.1)
Sodium: 144 mmol/L (ref 136–145)

## 2014-11-03 ENCOUNTER — Ambulatory Visit: Payer: Self-pay | Admitting: Psychiatry

## 2014-11-13 ENCOUNTER — Ambulatory Visit: Payer: Self-pay | Admitting: Psychiatry

## 2014-11-13 DIAGNOSIS — F323 Major depressive disorder, single episode, severe with psychotic features: Secondary | ICD-10-CM | POA: Diagnosis not present

## 2014-11-13 DIAGNOSIS — F333 Major depressive disorder, recurrent, severe with psychotic symptoms: Secondary | ICD-10-CM | POA: Diagnosis not present

## 2014-11-14 ENCOUNTER — Encounter: Payer: Medicare Other | Admitting: Internal Medicine

## 2014-11-20 DIAGNOSIS — F333 Major depressive disorder, recurrent, severe with psychotic symptoms: Secondary | ICD-10-CM | POA: Diagnosis not present

## 2014-11-20 DIAGNOSIS — F323 Major depressive disorder, single episode, severe with psychotic features: Secondary | ICD-10-CM | POA: Diagnosis not present

## 2014-11-29 DIAGNOSIS — F333 Major depressive disorder, recurrent, severe with psychotic symptoms: Secondary | ICD-10-CM | POA: Diagnosis not present

## 2014-11-29 DIAGNOSIS — F323 Major depressive disorder, single episode, severe with psychotic features: Secondary | ICD-10-CM | POA: Diagnosis not present

## 2014-12-06 DIAGNOSIS — F251 Schizoaffective disorder, depressive type: Secondary | ICD-10-CM | POA: Diagnosis not present

## 2014-12-12 ENCOUNTER — Ambulatory Visit
Admit: 2014-12-12 | Disposition: A | Discharge status: Home / Self Care | Payer: Self-pay | Attending: Psychiatry | Admitting: Psychiatry

## 2014-12-12 DIAGNOSIS — F323 Major depressive disorder, single episode, severe with psychotic features: Secondary | ICD-10-CM | POA: Diagnosis not present

## 2014-12-13 DIAGNOSIS — F323 Major depressive disorder, single episode, severe with psychotic features: Secondary | ICD-10-CM | POA: Diagnosis not present

## 2014-12-13 DIAGNOSIS — F333 Major depressive disorder, recurrent, severe with psychotic symptoms: Secondary | ICD-10-CM | POA: Diagnosis not present

## 2014-12-27 DIAGNOSIS — F333 Major depressive disorder, recurrent, severe with psychotic symptoms: Secondary | ICD-10-CM | POA: Diagnosis not present

## 2014-12-27 DIAGNOSIS — F323 Major depressive disorder, single episode, severe with psychotic features: Secondary | ICD-10-CM | POA: Diagnosis not present

## 2015-01-10 DIAGNOSIS — F323 Major depressive disorder, single episode, severe with psychotic features: Secondary | ICD-10-CM | POA: Diagnosis not present

## 2015-01-10 DIAGNOSIS — F3340 Major depressive disorder, recurrent, in remission, unspecified: Secondary | ICD-10-CM | POA: Diagnosis not present

## 2015-01-10 DIAGNOSIS — F333 Major depressive disorder, recurrent, severe with psychotic symptoms: Secondary | ICD-10-CM | POA: Diagnosis not present

## 2015-01-12 ENCOUNTER — Ambulatory Visit
Admit: 2015-01-12 | Disposition: A | Discharge status: Home / Self Care | Payer: Self-pay | Attending: Psychiatry | Admitting: Psychiatry

## 2015-01-12 DIAGNOSIS — F323 Major depressive disorder, single episode, severe with psychotic features: Secondary | ICD-10-CM | POA: Diagnosis not present

## 2015-01-22 DIAGNOSIS — L309 Dermatitis, unspecified: Secondary | ICD-10-CM | POA: Diagnosis not present

## 2015-01-22 DIAGNOSIS — D225 Melanocytic nevi of trunk: Secondary | ICD-10-CM | POA: Diagnosis not present

## 2015-01-22 DIAGNOSIS — X32XXXA Exposure to sunlight, initial encounter: Secondary | ICD-10-CM | POA: Diagnosis not present

## 2015-01-22 DIAGNOSIS — L570 Actinic keratosis: Secondary | ICD-10-CM | POA: Diagnosis not present

## 2015-01-24 DIAGNOSIS — F251 Schizoaffective disorder, depressive type: Secondary | ICD-10-CM | POA: Diagnosis not present

## 2015-01-31 DIAGNOSIS — F333 Major depressive disorder, recurrent, severe with psychotic symptoms: Secondary | ICD-10-CM | POA: Diagnosis not present

## 2015-01-31 DIAGNOSIS — F323 Major depressive disorder, single episode, severe with psychotic features: Secondary | ICD-10-CM | POA: Diagnosis not present
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
## 2015-02-03 NOTE — Discharge Summary (Signed)
PATIENT NAME:  Gary Osborn, Gary Osborn MR#:  [id number] DATE OF BIRTH:  1933/06/03  DATE OF ADMISSION:  01/20/2014 DATE OF DISCHARGE:  02/20/2014  HOSPITAL COURSE: See dictated history and physical for details of admission. A 81 year old man with a history of obsessive-compulsive disorder in the past, who recently has had a major depression going on for several months. He had had 2 prior suicide attempts by walking out into traffic and two prior hospitalizations. He was referred to my office for consideration of ECT period.  At the time that I evaluated him, I felt that his needs were so acute that he needed to be hospitalized at that time. He has been in the hospital now ever since he was first admitted on April 10th. The patient was admitted with a diagnosis of major depression with psychotic symptoms. He was having suicidal ideation at the time. He was not able to clearly think through treatment planning, and because of the emergency situation, his wife gave consent for his ECT treatment although the patient was then cooperative with ECT and did not protest. He received 5 right unilateral ECT treatments, with the the last one being done on April 27th. The patient tolerated the treatments themselves well and showed gradual improvement in his mood, but unfortunately, especially over the last couple treatments showed significant decline in his memory and orientation and general cognition. He became confused to the point of delirium. Required one-on-one nursing supervision for several days. Even during that time, his mood seemed to be good, but he was very confused and disoriented. ECT was discontinued and he was continued on the medication he had previously been started on which was Paxil and Abilify. I had also started him on Aricept because of baseline cognitive decline, but I discontinued that to try and minimize anything that could be causing any delirium. The patient has gradually shown resolution and at this point  has had several days free of delirium. He is alert and oriented fully now. He states that his mood is feeling good and denies feeling depressed. Denies suicidal ideation. He has been able to articulate better insight into the excessive nature of his worry. He is tolerating medicine well. He is agreeable to outpatient treatment and will be referred back to Dr. Casimiro Needle in [place]. At this point, I do not think that further ECT would be appropriate because of his high risk of delirium. I would not rule it out completely in the future, but that would have to be taken into account if we were to reattempt ECT.   DISCHARGE MEDICATIONS: Paxil 40 mg once a day, BuSpar 10 mg twice a day, Restoril 7.5 mg at night, aripiprazole 10 mg at night, docusate 200 mg twice a day, MiraLax 17 grams in water once a day.   LABORATORY RESULTS: Admission labs included a chemistry panel without really significant abnormalities. Very slightly elevated bilirubin. CBC: Slightly low platelet count 139, otherwise unremarkable. Urinalysis normal. EKG and chest x-ray unremarkable. 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   DISPOSITION: He will be discharged home with his wife and will follow up with Dr. Casimiro Needle in [place] within the next couple of days.   MENTAL STATUS EXAM AT DISCHARGE: Elderly man still slightly disheveled but much better groomed than he has been for several days. Eye contact good. Psychomotor activity remains a little bit slow, but much more normal than I  have seen it throughout his hospital stay. Speech is a little quiet and a little bit slow, but again closer to normal than he has been before. Affect is smiling, upbeat, still a little constricted. Mood is stated as being okay. Thoughts are lucid. Generally, no loosening of associations or delusions. Denies hallucinations. Denies any suicidal or  homicidal ideation. Able to articulate positive things in his life and reasons to live. The patient's short term memory is still slightly impaired, but much better than it had been before. Long-term memory generally grossly intact. Fund of knowledge normal. Alert and oriented x 4, although he is still not completely clear about the date.   DIAGNOSIS, PRINCIPAL AND PRIMARY:  AXIS I: Major depression, severe, recurrent, with psychotic features.   SECONDARY DIAGNOSES:  AXIS I: Obsessive-compulsive disorder. Dementia, mixed Alzheimer's, and history of brain injury.  AXIS II: Deferred.  AXIS III: Constipation, resolved.  AXIS IV: Moderate from being at home with his wife, more severe from the ongoing grieving over the death of his daughter.  AXIS V: Functioning at time of discharge, 55.     ____________________________ Gonzella Lex, MD jtc:dd D: 02/20/2014 16:07:16 ET T: 02/21/2014 03:11:24 ET JOB#: [id number]  cc: Gonzella Lex, MD, <Dictator> Gonzella Lex MD ELECTRONICALLY SIGNED 02/24/2014 15:34

## 2015-02-03 NOTE — H&P (Signed)
PATIENT NAME:  Gary, Osborn MR#:  [id number] DATE OF BIRTH:  1933-10-04  DATE OF ADMISSION:  09/28/2014  IDENTIFYING INFORMATION AND CHIEF COMPLAINT: A 81 year old man with a history of recurrent major depression with psychotic features as well as a history of obsessive-compulsive disorder who is transferred to [place] from the [place] for ECT treatment.   CHIEF COMPLAINT: "I suppose for the ECT."    HISTORY OF PRESENT ILLNESS: Information obtained from the patient and the chart and the old chart and my previous knowledge of this patient. This patient has had recurrent major depression and was last seen at our hospital in the spring of this year. After discharge from the hospital he says he remained reasonably well for a couple of months, then it sounds like he may have had a period of noncompliance with medication. More recently in the autumn there was an event in which he very intentionally walked into a pond fully dressed with the intention of drowning himself. He changed his mind at the last minute, but since that time a few weeks ago has been in a psychiatric hospital again. The patient reports his mood is down, sad, and depressed. Feels tired. Sleeps only with medication assistance. He is completely fixated on a delusional belief that he is going to go to prison over some kind of imaginary tax problem. This is the same type of delusion he was having in the spring time. He is so convinced of what a disaster this is that he judges it to be worthy of suicide. He has been cooperative with medicine, but apparently has shown little or no improvement. He was transferred to [place] because of his previous history of ECT response.   PAST PSYCHIATRIC HISTORY: Distant diagnosis of OCD and patient in fact has even had a cingulotomy years ago. More recently he had ECT this spring for the similar complaints. The ECT was effective for his mood and delusions, but also caused side effects of delirium that took quite  a while to resolve. Since that time he has been following up with Dr. Casimiro Needle who his outpatient psychiatrist. He does have a history of more than one serious suicide attempt in the past. Recurrent trials of antidepressants and antipsychotics have so far been ineffective for this condition.   PAST MEDICAL HISTORY: Mr. Kornegay outside of his mental health problems is in relatively good health. No history of myocardial infarction or stroke. No diabetes. Blood pressure is reasonable.   FAMILY HISTORY: He has a daughter who committed suicide just a few years ago as an adult and a clearcut history of severe depression in his family.   SOCIAL HISTORY: The patient is a retired Forensic psychologist. Quite successful in his day apparently. Still lives with his wife. She is supportive, but has her own health problems.   SUBSTANCE ABUSE HISTORY: Not an issue.   CURRENT MEDICATIONS: On presentation to the hospital he was taking BuSpar 10 mg 3 times a day, Klonopin 0.5 mg twice a day, mirtazapine 7.5 mg at night, trazodone 50 mg at night as needed for sleep, Paxil 40 mg a day, Risperdal 1 mg at night.   ALLERGIES: No known drug allergies.   REVIEW OF SYSTEMS: The patient complains of constipation. He also complains of his recurrent problem with urinating in the presence of other men. This is so severe that he cannot even urinate when he thinks about the possibility that there could be other men present, even if they are  not present. This remarkably is a major concern for him. Otherwise no medical complaints. He does still report having suicidal thoughts at times.   MENTAL STATUS EXAMINATION: Slightly disheveled gentleman, looks his stated age, cooperative with the interview. Eye contact intermittent. Psychomotor activity a little slow. Speech a little slow, but understandable. Affect is blunted and constricted. Mood stated as depressed. Thoughts are in many ways lucid until you get on the subject of his delusions,  particularly his belief that he is going to go to prison over something completely imaginary related to his finances and his belief that he has an extraordinarily small penis and that this has some kind of bearing on his life. He does not report hallucinations. He does continue to report suicidal thoughts without specific plan. No homicidal ideation. He is alert and oriented x 4. He can recall 3 out of 3 objects immediately, 3 out of 3 at 2 minutes. Judgment and insight impaired by delusions.   LABORATORY RESULTS: Laboratories that were done on admission include a urinalysis that is unremarkable. Chemistry panel with a low total protein and albumin of 5.3 and 2.9 respectively. Calcium low at 8.  CBC, low platelet count at 123,000, anemic with a hematocrit of 37.8, white count low at 3.1.   VITAL SIGNS: Vitals at the time of admission, blood pressure 130/79, respirations 18, pulse 77, temperature 98.1.   PHYSICAL EXAMINATION:  GENERAL:  The patient appears in no acute distress.  NEUROLOGIC:  Gait is normal. Moves all extremities without difficulty. Full range of motion. Strength and reflexes symmetric and normal throughout. Cranial nerves symmetric and normal.  SKIN: No skin lesions of significance identified.  LUNGS: Clear without wheezes.  HEART: Regular rate and rhythm.  ABDOMEN: Soft, nontender, normal bowel sounds.  VITAL SIGNS:  As noted above.   ASSESSMENT: A 81 year old man with recurrent major depression with psychotic features. Also obsessive-compulsive disorder by history. The patient remains a high suicide risk as evidenced by his recurrent suicide attempts including one within the last month or so that was nearly successful. Very serious and concerning means and intent. It was driven by his delusion and he is still completely convinced of the delusion. The patient is an appropriate candidate of electroconvulsive therapy other than the fact that he had a lot of delirium last time.    TREATMENT PLAN: I have discussed the pros and cons of ECT with the patient and have nonetheless recommended that we try it again given his response last time and that we try to be cautious,  perhaps with less frequent treatment to try and minimize side effects. At the time of my initial evaluation he was agreeable to the plan.   TREATMENT PLAN:  Plan for ECT to initiate next week when possible. Continue medication with an increase in Paxil.   DIAGNOSIS PRINCIPAL AND PRIMARY:  AXIS I: Major depression, severe, recurrent, psychotic features.   SECONDARY DIAGNOSES:  AXIS I: Obsessive-compulsive disorder.   AXIS II: Deferred.   AXIS III: No diagnosis.   AXIS V: Functioning at time of evaluation 30.     ____________________________ Gonzella Lex, MD jtc:bu D: 10/02/2014 21:11:56 ET T: 10/02/2014 21:30:36 ET JOB#: [id number]  cc: Gonzella Lex, MD, <Dictator> Gonzella Lex MD ELECTRONICALLY SIGNED 10/04/2014 0:42

## 2015-02-11 NOTE — Discharge Summary (Signed)
PATIENT NAME:  Gary Osborn, Gary Osborn MR#:  [id number] DATE OF BIRTH:  08-Feb-1933  DATE OF ADMISSION:  09/28/2014 DATE OF DISCHARGE:  11/06/2014  HOSPITAL COURSE: See dictated history and physical. The patient is a 81 year old man with a history of recurrent episodes of psychotic depression characterized by obsessive ruminations and anxiety that have escalated to the point of suicide attempts more than once in the past. He was admitted to the hospital with the hope of restarting ECT treatment. We had seen the patient earlier in 2015. With similar symptoms, and had performed ECT at that time. It had helped with his depressive symptoms, but had resulted in a prolonged problem with memory impairment. Because of the side effects that were observed last time, and I was ambivalent about restarting ECT this time. The patient himself declined the suggestion of ECT initially, and gave logical reasons for it, that he wanted to avoid the memory loss. On the other hand, he continued to have his psychotic worry that somehow there was a problem with his taxes that was going to result in him inevitably going to prison, and that somehow that justified him trying to kill himself because of his obsessive concern about his supposedly small penis. These 2 twin concerns dominated his thinking for much of his hospital stay. He was not able to contract for safety or really to get off topic to discuss anything else. Medication management was attempted and I increased his Paxil dose up to 60 mg a day and have switched it antipsychotics to [place]. He has tolerated medicine without any difficulty. He has not had problems with constipation on his current medicine regimen. Ultimately, however, he really did not show resolution of his obsessive worry.   We had a family meeting, and the wife indicated quite reasonably that she was not comfortable having him come home while he was still in the grips of his obsessive delusion because of her concern  about high suicide risk. After this, I re-approached the patient and suggested that we try ECT, despite understanding the risks of side effects. The patient was agreeable at that point. ECT was then initiated with the first treatment on 10/20/2014, and he had a total of 4 treatments right unilateral, all of which went well without complications. The patient initially did not have memory problems, but after the fourth treatment, became very confused, not knowing where he was or why he was in the hospital. At the same time, his mood seemed to have improved. Therefore, at that point, I stopped the course of ECT and we have continued to evaluate him since then.   Gradually, his memory problems have improved a great deal. As of this weekend, he was able to tell me where he was and what year it was. He was able to identify me correctly, and what my role was, and even knew why he was in the hospital. His mood continues to be reported now as "good" and he is not spontaneously discussing any of the anxieties as he had in the past. When asked about them, he seems unconcerned and says that they do not really enter his mind. He denies any suicidal ideation.   I think at this point, we should discharge him from the hospital. I have discussed the case with his wife. His wife and family understand that he is at chronic risk of relapse, but are agreeable to and attempt at maintenance ECT and medication management, as is the patient.   DISPOSITION: He will  be discharged home with family. I would like to see him back for a next ECT treatment a week from today on 11/13/2014. We can then evaluate whether it will be a reasonable long term strategy. He will also follow up with Dr.  Marland KitchenDictation Anomaly) <<PlovskyMISSING TEXT>>  for his outpatient psychiatric care.   MENTAL STATUS EXAMINATION AT DISCHARGE: Slightly disheveled gentleman who looks his stated age, cooperative with the interview. Eye contact good. Psychomotor activity a  little slow. Speech normal rate, tone and volume. Affect euthymic, a little blunted. Mood stated as being okay. Thoughts are slow, but lucid. No evidence of delusions currently. Denies auditory or visual hallucinations. Denies suicidal or homicidal ideation. He is alert and oriented to his situation. He can repeat 3 words immediately and remember 2 of them at 3 minutes. Judgment and insight appear adequate.   DISCHARGE MEDICATIONS: Trazodone 50 mg at night as needed for sleep, Paxil 60 mg once a day, Remeron 15 mg at night, BuSpar 10 mg 3 times a day, clonazepam 0.5 mg twice a day, lurasidone 60 mg once a day at supper with food, docusate 1 capsule of 100 mg 2 times a day, MiraLax 8.5 g in water once a day and magnesium oxide 400 mg once a day.   DIAGNOSIS, PRINCIPAL AND PRIMARY:  AXIS I: Major depression, severe, recurrent with psychotic features, now improved.   SECONDARY DIAGNOSES:  AXIS I:  Obsessive-compulsive disorder.  AXIS II:  No diagnosis.  AXIS III:  History of constipation.   ____________________________ Gonzella Lex, MD jtc:MT D: 11/06/2014 11:08:18 ET T: 11/06/2014 11:34:51 ET JOB#: [id number]  cc: Gonzella Lex, MD, <Dictator> Gonzella Lex MD ELECTRONICALLY SIGNED 11/22/2014 17:20

## 2015-02-28 ENCOUNTER — Other Ambulatory Visit: Payer: Self-pay

## 2015-03-02 ENCOUNTER — Encounter: Payer: Medicare Other | Admitting: *Deleted

## 2015-03-02 ENCOUNTER — Encounter: Payer: Self-pay | Admitting: *Deleted

## 2015-03-02 ENCOUNTER — Encounter
Admission: RE | Admit: 2015-03-02 | Discharge: 2015-03-02 | Disposition: A | Discharge status: Home / Self Care | Payer: Medicare Other | Source: Ambulatory Visit | Attending: Psychiatry | Admitting: Psychiatry

## 2015-03-02 DIAGNOSIS — F333 Major depressive disorder, recurrent, severe with psychotic symptoms: Secondary | ICD-10-CM | POA: Diagnosis not present

## 2015-03-02 MED ORDER — METHOHEXITAL 100 MG/10 ML PREMIX SYRINGE
80.0000 mg | Freq: Once | INTRAVENOUS | Status: AC
  Administered 2015-03-02: 80 mg via INTRAVENOUS

## 2015-03-02 MED ORDER — LIDOCAINE HCL (CARDIAC) 20 MG/ML IV SOLN
4.0000 mg | Freq: Once | INTRAVENOUS | Status: AC
  Administered 2015-03-02: 4 mg via INTRAVENOUS

## 2015-03-02 MED ORDER — DEXTROSE 5 % IV SOLN
INTRAVENOUS | Status: DC | PRN
  Administered 2015-03-02: 11:00:00 via INTRAVENOUS

## 2015-03-02 MED ORDER — CLONAZEPAM 0.5 MG PO TABS: 0.5000 mg | ORAL_TABLET | Freq: Two times a day (BID) | ORAL | Status: AC | PRN

## 2015-03-02 MED ORDER — DEXTROSE 5 % IV SOLN
250.0000 mL | Freq: Once | INTRAVENOUS | Status: AC
  Administered 2015-03-02: 250 mL via INTRAVENOUS

## 2015-03-02 MED ORDER — MIRTAZAPINE 45 MG PO TABS: 15.0000 mg | ORAL_TABLET | Freq: Every day | ORAL | Status: AC

## 2015-03-02 MED ORDER — SUCCINYLCHOLINE CHLORIDE 20 MG/ML IJ SOLN
80.0000 mg | Freq: Once | INTRAMUSCULAR | Status: AC
  Administered 2015-03-02: 80 mg via INTRAVENOUS

## 2015-03-02 NOTE — Anesthesia Preprocedure Evaluation (Signed)
Anesthesia Evaluation  Patient identified by MRN, date of birth, ID band Patient awake    Reviewed: Allergy & Precautions, NPO status , Patient's Chart, lab work & pertinent test results  Airway Mallampati: IV  TM Distance: >3 FB Neck ROM: Limited  Mouth opening: Limited Mouth Opening  Dental  (+) Teeth Intact   Pulmonary former smoker,    Pulmonary exam normal       Cardiovascular Normal cardiovascular exam    Neuro/Psych    GI/Hepatic   Endo/Other    Renal/GU      Musculoskeletal   Abdominal Normal abdominal exam  (+)   Peds  Hematology   Anesthesia Other Findings   Reproductive/Obstetrics                             Anesthesia Physical Anesthesia Plan  ASA: III  Anesthesia Plan: General   Post-op Pain Management:    Induction: Intravenous  Airway Management Planned: Mask  Additional Equipment:   Intra-op Plan:   Post-operative Plan:   Informed Consent: I have reviewed the patients History and Physical, chart, labs and discussed the procedure including the risks, benefits and alternatives for the proposed anesthesia with the patient or authorized representative who has indicated his/her understanding and acceptance.     Plan Discussed with: CRNA  Anesthesia Plan Comments:         Anesthesia Quick Evaluation

## 2015-03-02 NOTE — Transfer of Care (Signed)
Immediate Anesthesia Transfer of Care Note  Patient: Gary Osborn  Procedure(s) Performed: ECT  Patient Location: PACU  Anesthesia Type:General  Level of Consciousness: sedated  Airway & Oxygen Therapy: Patient connected to face mask oxygen  Post-op Assessment: Report given to RN  Post vital signs: stable  Last Vitals:  Filed Vitals:   03/02/15 0910  BP: 124/65  Pulse: 57  Temp: 36.4 C  Resp: 18    Complications: No apparent anesthesia complications

## 2015-03-02 NOTE — Procedures (Signed)
ECT SERVICES Physician's Interval Evaluation & Treatment Note  Patient Identification: Gary Osborn MRN:  [id number] Date of Evaluation:  03/02/2015 TX #: 12  MADRS:   MMSE:   P.E. Findings:  Patient getting maintenance. Stable.  Psychiatric Interval Note:  Mood good. No return of SI  Subjective:  Patient is a 81 y.o. male seen for evaluation for Electroconvulsive Therapy. Feeling good. No change to PE  Treatment Summary:   [x]   Right Unilateral             []  Bilateral   % Energy : 0.47ms 80%   Impedance: 1260  Seizure Energy Index: 2814  Postictal Suppression Index: 61  Seizure Concordance Index: 74  Medications  Pre Shock: xyl 4mg  brev 80mg  suc 80mg   Post Shock:    Seizure Duration: 38/77s   Comments: Follow up June 17   Lungs:  [x]   Clear to auscultation               []  Other:   Heart:    [x]   Regular rhythm             []  irregular rhythm    [x]   Previous H&P reviewed, patient examined and there are NO CHANGES                 []   Previous H&P reviewed, patient examined and there are changes noted.   Alethia Berthold, MD 5/20/201611:11 AM

## 2015-03-02 NOTE — Anesthesia Postprocedure Evaluation (Signed)
  Anesthesia Post-op Note  Patient: Gary Osborn  Procedure(s) Performed: * No procedures listed *  Anesthesia type:General  Patient location: PACU  Post pain: Pain level controlled  Post assessment: Post-op Vital signs reviewed, Patient's Cardiovascular Status Stable, Respiratory Function Stable, Patent Airway and No signs of Nausea or vomiting  Post vital signs: Reviewed and stable  Last Vitals:  Filed Vitals:   03/02/15 1200  BP: 116/81  Pulse: 78  Temp:   Resp: 20    Level of consciousness: awake, alert  and patient cooperative  Complications: No apparent anesthesia complications

## 2015-03-02 NOTE — Anesthesia Procedure Notes (Signed)
Date/Time: 03/02/2015 11:13 AM Performed by: Nelda Marseille Pre-anesthesia Checklist: Patient identified, Timeout performed, Emergency Drugs available, Suction available and Patient being monitored Patient Re-evaluated:Patient Re-evaluated prior to inductionOxygen Delivery Method: Ambu bag Preoxygenation: Pre-oxygenation with 100% oxygen

## 2015-03-08 DIAGNOSIS — F251 Schizoaffective disorder, depressive type: Secondary | ICD-10-CM | POA: Diagnosis not present

## 2015-03-28 ENCOUNTER — Other Ambulatory Visit: Payer: Self-pay

## 2015-03-30 ENCOUNTER — Ambulatory Visit (INDEPENDENT_AMBULATORY_CARE_PROVIDER_SITE_OTHER): Payer: Medicare Other | Admitting: Internal Medicine

## 2015-03-30 ENCOUNTER — Encounter: Payer: Self-pay | Admitting: Internal Medicine

## 2015-03-30 VITALS — BP 120/80 | HR 97 | Temp 98.2°F | Resp 20 | Ht 68.5 in | Wt 194.0 lb

## 2015-03-30 DIAGNOSIS — F322 Major depressive disorder, single episode, severe without psychotic features: Secondary | ICD-10-CM | POA: Diagnosis not present

## 2015-03-30 DIAGNOSIS — F411 Generalized anxiety disorder: Secondary | ICD-10-CM | POA: Diagnosis not present

## 2015-03-30 DIAGNOSIS — F332 Major depressive disorder, recurrent severe without psychotic features: Secondary | ICD-10-CM | POA: Diagnosis not present

## 2015-03-30 DIAGNOSIS — Z23 Encounter for immunization: Secondary | ICD-10-CM

## 2015-03-30 DIAGNOSIS — Z915 Personal history of self-harm: Secondary | ICD-10-CM

## 2015-03-30 DIAGNOSIS — Z0000 Encounter for general adult medical examination without abnormal findings: Secondary | ICD-10-CM

## 2015-03-30 DIAGNOSIS — Z9151 Personal history of suicidal behavior: Secondary | ICD-10-CM

## 2015-03-30 DIAGNOSIS — C61 Malignant neoplasm of prostate: Secondary | ICD-10-CM

## 2015-03-30 NOTE — Patient Instructions (Signed)
It is important that you exercise regularly, at least 20 minutes 3 to 4 times per week.  If you develop chest pain or shortness of breath seek  medical attention.  Yearly  Eye examination Annual urology evaluation Cardiac Diet This diet can help prevent heart disease and stroke. Many factors influence your heart health, including eating and exercise habits. Coronary risk rises a lot with abnormal blood fat (lipid) levels. Cardiac meal planning includes limiting unhealthy fats, increasing healthy fats, and making other small dietary changes. General guidelines are as follows:  Adjust calorie intake to reach and maintain desirable body weight.  Limit total fat intake to less than 30% of total calories. Saturated fat should be less than 7% of calories.  Saturated fats are found in animal products and in some vegetable products. Saturated vegetable fats are found in coconut oil, cocoa butter, palm oil, and palm kernel oil. Read labels carefully to avoid these products as much as possible. Use butter in moderation. Choose tub margarines and oils that have 2 grams of fat or less. Good cooking oils are canola and olive oils.  Practice low-fat cooking techniques. Do not fry food. Instead, broil, bake, boil, steam, grill, roast on a rack, stir-fry, or microwave it. Other fat reducing suggestions include:  Remove the skin from poultry.  Remove all visible fat from meats.  Skim the fat off stews, soups, and gravies before serving them.  Steam vegetables in water or broth instead of sauting them in fat.  Avoid foods with trans fat (or hydrogenated oils), such as commercially fried foods and commercially baked goods. Commercial shortening and deep-frying fats will contain trans fat.  Increase intake of fruits, vegetables, whole grains, and legumes to replace foods high in fat.  Increase consumption of nuts, legumes, and seeds to at least 4 servings weekly. One serving of a legume equals  cup, and 1  serving of nuts or seeds equals  cup.  Choose whole grains more often. Have 3 servings per day (a serving is 1 ounce [oz]).  Eat 4 to 5 servings of vegetables per day. A serving of vegetables is 1 cup of raw leafy vegetables;  cup of raw or cooked cut-up vegetables;  cup of vegetable juice.  Eat 4 to 5 servings of fruit per day. A serving of fruit is 1 medium whole fruit;  cup of dried fruit;  cup of fresh, frozen, or canned fruit;  cup of 100% fruit juice.  Increase your intake of dietary fiber to 20 to 30 grams per day. Insoluble fiber may help lower your risk of heart disease and may help curb your appetite.  Soluble fiber binds cholesterol to be removed from the blood. Foods high in soluble fiber are dried beans, citrus fruits, oats, apples, bananas, broccoli, Brussels sprouts, and eggplant.  Try to include foods fortified with plant sterols or stanols, such as yogurt, breads, juices, or margarines. Choose several fortified foods to achieve a daily intake of 2 to 3 grams of plant sterols or stanols.  Foods with omega-3 fats can help reduce your risk of heart disease. Aim to have a 3.5 oz portion of fatty fish twice per week, such as salmon, mackerel, albacore tuna, sardines, lake trout, or herring. If you wish to take a fish oil supplement, choose one that contains 1 gram of both DHA and EPA.  Limit processed meats to 2 servings (3 oz portion) weekly.  Limit the sodium in your diet to 1500 milligrams (mg) per day. If you  have high blood pressure, talk to a registered dietitian about a DASH (Dietary Approaches to Stop Hypertension) eating plan.  Limit sweets and beverages with added sugar, such as soda, to no more than 5 servings per week. One serving is:   1 tablespoon sugar.  1 tablespoon jelly or jam.   cup sorbet.  1 cup lemonade.   cup regular soda. CHOOSING FOODS Starches  Allowed: Breads: All kinds (wheat, rye, raisin, white, oatmeal, [place], [place], and English  muffin bread). Low-fat rolls: English muffins, frankfurter and hamburger buns, bagels, pita bread, tortillas (not fried). Pancakes, waffles, biscuits, and muffins made with recommended oil.  Avoid: Products made with saturated or trans fats, oils, or whole milk products. Butter rolls, cheese breads, croissants. Commercial doughnuts, muffins, sweet rolls, biscuits, waffles, pancakes, store-bought mixes. Crackers  Allowed: Low-fat crackers and snacks: Animal, graham, rye, saltine (with recommended oil, no lard), oyster, and matzo crackers. Bread sticks, melba toast, rusks, flatbread, pretzels, and light popcorn.  Avoid: High-fat crackers: cheese crackers, butter crackers, and those made with coconut, palm oil, or trans fat (hydrogenated oils). Buttered popcorn. Cereals  Allowed: Hot or cold whole-grain cereals.  Avoid: Cereals containing coconut, hydrogenated vegetable fat, or animal fat. Potatoes / Pasta / Rice  Allowed: All kinds of potatoes, rice, and pasta (such as macaroni, spaghetti, and noodles).  Avoid: Pasta or rice prepared with cream sauce or high-fat cheese. Chow mein noodles, [place] fries. Vegetables  Allowed: All vegetables and vegetable juices.  Avoid: Fried vegetables. Vegetables in cream, butter, or high-fat cheese sauces. Limit coconut. Fruit in cream or custard. Protein  Allowed: Limit your intake of meat, seafood, and poultry to no more than 6 oz (cooked weight) per day. All lean, well-trimmed beef, veal, pork, and lamb. All chicken and [place] without skin. All fish and shellfish. Wild game: wild duck, rabbit, pheasant, and venison. Egg whites or low-cholesterol egg substitutes may be used as desired. Meatless dishes: recipes with dried beans, peas, lentils, and tofu (soybean curd). Seeds and nuts: all seeds and most nuts.  Avoid: Prime grade and other heavily marbled and fatty meats, such as short ribs, spare ribs, rib eye roast or steak, frankfurters, sausage, bacon,  and high-fat luncheon meats, mutton. Caviar. Commercially fried fish. Domestic duck, goose, venison sausage. Organ meats: liver, gizzard, heart, chitterlings, brains, kidney, sweetbreads. Dairy  Allowed: Low-fat cheeses: nonfat or low-fat cottage cheese (1% or 2% fat), cheeses made with part skim milk, such as mozzarella, farmers, string, or ricotta. (Cheeses should be labeled no more than 2 to 6 grams fat per oz.). Skim (or 1%) milk: liquid, powdered, or evaporated. Buttermilk made with low-fat milk. Drinks made with skim or low-fat milk or cocoa. Chocolate milk or cocoa made with skim or low-fat (1%) milk. Nonfat or low-fat yogurt.  Avoid: Whole milk cheeses, including colby, cheddar, [place], Monte[place] Jack, [place], [place], [place], American, Swiss, and blue. Creamed cottage cheese, cream cheese. Whole milk and whole milk products, including buttermilk or yogurt made from whole milk, drinks made from whole milk. Condensed milk, evaporated whole milk, and 2% milk. Soups and Combination Foods  Allowed: Low-fat low-sodium soups: broth, dehydrated soups, homemade broth, soups with the fat removed, homemade cream soups made with skim or low-fat milk. Low-fat spaghetti, lasagna, chili, and Spanish rice if low-fat ingredients and low-fat cooking techniques are used.  Avoid: Cream soups made with whole milk, cream, or high-fat cheese. All other soups. Desserts and Sweets  Allowed: Sherbet, fruit ices, gelatins, meringues, and angel food cake.  Homemade desserts with recommended fats, oils, and milk products. Jam, jelly, honey, marmalade, sugars, and syrups. Pure sugar candy, such as gum drops, hard candy, jelly beans, marshmallows, mints, and small amounts of dark chocolate.  Avoid: Commercially prepared cakes, pies, cookies, frosting, pudding, or mixes for these products. Desserts containing whole milk products, chocolate, coconut, lard, palm oil, or palm kernel oil. Ice cream or ice cream drinks. Candy  that contains chocolate, coconut, butter, hydrogenated fat, or unknown ingredients. Buttered syrups. Fats and Oils  Allowed: Vegetable oils: safflower, sunflower, corn, soybean, cottonseed, sesame, canola, olive, or peanut. Non-hydrogenated margarines. Salad dressing or mayonnaise: homemade or commercial, made with a recommended oil. Low or nonfat salad dressing or mayonnaise.  Limit added fats and oils to 6 to 8 tsp per day (includes fats used in cooking, baking, salads, and spreads on bread). Remember to count the "hidden fats" in foods.  Avoid: Solid fats and shortenings: butter, lard, salt pork, bacon drippings. Gravy containing meat fat, shortening, or suet. Cocoa butter, coconut. Coconut oil, palm oil, palm kernel oil, or hydrogenated oils: these ingredients are often used in bakery products, nondairy creamers, whipped toppings, candy, and commercially fried foods. Read labels carefully. Salad dressings made of unknown oils, sour cream, or cheese, such as blue cheese and Ro[place]efort. Cream, all kinds: half-and-half, light, heavy, or whipping. Sour cream or cream cheese (even if "light" or low-fat). Nondairy cream substitutes: coffee creamers and sour cream substitutes made with palm, palm kernel, hydrogenated oils, or coconut oil. Beverages  Allowed: Coffee (regular or decaffeinated), tea. Diet carbonated beverages, mineral water. Alcohol: Check with your caregiver. Moderation is recommended.  Avoid: Whole milk, regular sodas, and juice drinks with added sugar. Condiments  Allowed: All seasonings and condiments. Cocoa powder. "Cream" sauces made with recommended ingredients.  Avoid: Carob powder made with hydrogenated fats. SAMPLE MENU Breakfast   cup orange juice   cup oatmeal  1 slice toast  1 tsp margarine  1 cup skim milk Lunch  [place] sandwich with 2 oz [place], 2 slices bread  Lettuce and tomato slices  Fresh fruit  Carrot sticks  Coffee or tea Snack  Fresh fruit  or low-fat crackers Dinner  3 oz lean ground beef  1 baked potato  1 tsp margarine   cup asparagus  Lettuce salad  1 tbs non-creamy dressing   cup peach slices  1 cup skim milk Document Released: 07/08/2008 Document Revised: 03/30/2012 Document Reviewed: 11/29/2013 Exit[employer] Patient Information 2015 [place], [place]. This information is not intended to replace advice given to you by your health care provider. Make sure you discuss any questions you have with your health care provider.

## 2015-03-30 NOTE — Progress Notes (Signed)
Pre visit review using our clinic review tool, if applicable. No additional management support is needed unless otherwise documented below in the visit note. 

## 2015-03-30 NOTE — Progress Notes (Signed)
Subjective:    Patient ID: Gary Osborn, male    DOB: Sep 10, 1933, 81 y.o.   MRN: [id number]  HPI   Subjective:    Patient ID: Gary Osborn, male    DOB: [birth year], 81 y.o.   MRN: [id number]  HPI   Subjective:    Patient ID: Gary Osborn, male    DOB: 1933-07-31, 81 y.o.   MRN: [id number]  HPIHistory of Present Illness:   81    year old patient who is in today for a health maintenance examination.  He  has a history of prostate cancer and is followed by urology. He is doing quite well today and is planning on seeing urology in June 2016.    He has a history of MDD and was hospitalized in November of last year after an unsuccessful suicide attempt.  He continues to receive outpatient ECT and is followed closely by psychiatry.  Hospital records and laboratory studies were reviewed  Last colonoscopy 2006   Here for Medicare AWV:   1. Risk factors based on Past M, S, F history: is factors include age  25. Physical Activities:.  Little physical activity of late, but no exercise restrictions  3. Depression/mood:History of MDD with multiple psychiatric admissions.  Presently receiving outpatient ECT 4. Hearing: no significant deficit  5. ADL's: remains independent in all aspects of daily living  6. Fall Risk: low  7. Home Safety: no problem identified  8. Height, weight, &visual acuity:height and weight stable. No difficulty with visual acuity did have a recent eye examination. This past week  9. Counseling: heart healthy diet, exercise regimen, and modest weight loss. All encouraged  10. Labsreviewed from recent hospital admission  11. Referral Coordination- follow-up urology, and GI , As well as psychiatry 12. Care Plan-  urology  in psychiatric follow-up  13. Cognitive Assessment- alert and oriented, with normal affect. No cognitive dysfunction  14.  Preventive services will include annual health examinations with screening lab.  He will see urology on annual basis due to  age.  No further screening colonoscopies will be performed.  Annual eye examinations recommended 15.  Provider list update includes primary care psychiatry and urology as well as ophthalmology   Allergies (verified):  No Known Drug Allergies   Past History:  Past Medical History:   Anxiety  Colonic polyps, hx of  history of prostate cancer  Allergic rhinitis  Depression  sinus bradycardia   Past Surgical History:   radical prostatectomy April 2008  Sinus surgery septoplasty June 2004  Tonsillectomy age 53  psychotherapy in the 70s  colonoscopy December 2006   Family History:   father died age 32, prostate cancer  mother died age 21  One sister, history of breast cancer   Social History:   two children, one daughter with  bipolar depression     Wt Readings from Last 3 Encounters:  10/08/11 207 lb (93.895 kg)  03/11/11 197 lb (89.359 kg)  02/10/11 193 lb (87.544 kg)     Review of Systems  Constitutional: Negative for fever, chills, activity change, appetite change and fatigue.  HENT: Negative for hearing loss, ear pain, congestion, rhinorrhea, sneezing, mouth sores, trouble swallowing, neck pain, neck stiffness, dental problem, voice change, sinus pressure and tinnitus.   Eyes: Negative for photophobia, pain, redness and visual disturbance.  Respiratory: Negative for apnea, cough, choking, chest tightness, shortness of breath and wheezing.   Cardiovascular: Negative for chest pain, palpitations and leg swelling.  Gastrointestinal: Negative  for nausea, vomiting, abdominal pain, diarrhea, constipation, blood in stool, abdominal distention, anal bleeding and rectal pain.  Genitourinary: Negative for dysuria, urgency, frequency, hematuria, flank pain, decreased urine volume, discharge, penile swelling, scrotal swelling, difficulty urinating (Some urinary incontinence following prostatectomy), genital sores and testicular pain.  Musculoskeletal: Negative for myalgias, back  pain, joint swelling, arthralgias and gait problem.  Skin: Negative for color change, rash and wound.  Neurological: Negative for dizziness, tremors, seizures, syncope, facial asymmetry, speech difficulty, weakness, light-headedness, numbness and headaches.  Hematological: Negative for adenopathy. Does not bruise/bleed easily.  Psychiatric/Behavioral: Negative for suicidal ideas, hallucinations, behavioral problems, confusion, sleep disturbance, self-injury, dysphoric mood, decreased concentration and agitation. The patient is not nervous/anxious.        Objective:   Physical Exam  Constitutional: He appears well-developed and well-nourished.  HENT:  Head: Normocephalic and atraumatic.  Right Ear: External ear normal.  Left Ear: External ear normal.  Nose: Nose normal.  Mouth/Throat: Oropharynx is clear and moist.  Eyes: Conjunctivae and EOM are normal. Pupils are equal, round, and reactive to light. No scleral icterus.  Neck: Normal range of motion. Neck supple. No JVD present. No thyromegaly present.  Cardiovascular: Regular rhythm, normal heart sounds and intact distal pulses.  Exam reveals no gallop and no friction rub.   No murmur heard. Pulmonary/Chest: Effort normal and breath sounds normal. He exhibits no tenderness.  Abdominal: Soft. Bowel sounds are normal. He exhibits no distension and no mass. There is no tenderness.  Genitourinary: Penis normal.  Musculoskeletal: Normal range of motion. He exhibits no edema and no tenderness.  Lymphadenopathy:    He has no cervical adenopathy.  Neurological: He is alert. He has normal reflexes. No cranial nerve deficit. Coordination normal.  Skin: Skin is warm and dry. No rash noted.  Psychiatric: He has a normal mood and affect. His behavior is normal.          Assessment & Plan:    Preventive health examination Status post prostatectomy Depression- stable Colonic polyps  Will check updated labs. Followup urology and GI as  scheduled. Return here in one year or as needed   Review of Systems as above     Objective:   Physical Exam  Testicular atrophy       Assessment & Plan:   Preventive health examination History colonic polyps History Maj. depression. Continue psychiatric and psychology followup History of prostate cancer. Continue annual urology followup  More regular exercise recommended Recheck one year  Review of Systems As above   Objective:   Physical Exam  as above       Assessment & Plan:   Preventive health examination  MDD.  Psychiatric follow-up  History colonic polyps  Anxiety disorder   Laboratory studies reviewed  Psychiatric follow-up   Forms completed for [facility]  Return here in one year or as needed  Urology follow-up next month as scheduled

## 2015-04-06 ENCOUNTER — Ambulatory Visit
Admission: RE | Admit: 2015-04-06 | Discharge: 2015-04-06 | Disposition: A | Discharge status: Home / Self Care | Payer: Medicare Other | Source: Ambulatory Visit | Attending: Psychiatry | Admitting: Psychiatry

## 2015-04-06 ENCOUNTER — Encounter: Payer: Self-pay | Admitting: Anesthesiology

## 2015-04-06 ENCOUNTER — Encounter: Payer: Medicare Other | Admitting: Anesthesiology

## 2015-04-06 DIAGNOSIS — F333 Major depressive disorder, recurrent, severe with psychotic symptoms: Secondary | ICD-10-CM | POA: Diagnosis not present

## 2015-04-06 MED ORDER — ONDANSETRON HCL 4 MG/2ML IJ SOLN: 4.0000 mg | Freq: Once | INTRAMUSCULAR | Status: AC | PRN

## 2015-04-06 MED ORDER — LIDOCAINE HCL (CARDIAC) 20 MG/ML IV SOLN
4.0000 mg | Freq: Once | INTRAVENOUS | Status: AC
  Administered 2015-04-06: 4 mg via INTRAVENOUS

## 2015-04-06 MED ORDER — METHOHEXITAL SODIUM 100 MG/10ML IV SOSY
80.0000 mg | PREFILLED_SYRINGE | Freq: Once | INTRAVENOUS | Status: AC
  Administered 2015-04-06: 80 mg via INTRAVENOUS

## 2015-04-06 MED ORDER — SUCCINYLCHOLINE CHLORIDE 20 MG/ML IJ SOLN
80.0000 mg | Freq: Once | INTRAMUSCULAR | Status: AC
  Administered 2015-04-06: 80 mg via INTRAVENOUS

## 2015-04-06 MED ORDER — DEXTROSE 5 % IV SOLN
250.0000 mL | Freq: Once | INTRAVENOUS | Status: AC
  Administered 2015-04-06: 250 mL via INTRAVENOUS

## 2015-04-06 MED ORDER — DEXTROSE 5 % IV SOLN
INTRAVENOUS | Status: DC | PRN
  Administered 2015-04-06: 13:00:00 via INTRAVENOUS

## 2015-04-06 MED ORDER — FENTANYL CITRATE (PF) 100 MCG/2ML IJ SOLN: 25.0000 ug | INTRAMUSCULAR | Status: DC | PRN

## 2015-04-06 NOTE — Anesthesia Preprocedure Evaluation (Signed)
Anesthesia Evaluation  Patient identified by MRN, date of birth, ID band Patient awake    Reviewed: Allergy & Precautions, NPO status , Patient's Chart, lab work & pertinent test results  Airway Mallampati: IV  TM Distance: >3 FB Neck ROM: Limited  Mouth opening: Limited Mouth Opening  Dental  (+) Teeth Intact, Chipped   Pulmonary former smoker,    Pulmonary exam normal       Cardiovascular negative cardio ROS Normal cardiovascular exam    Neuro/Psych PSYCHIATRIC DISORDERS Anxiety Depression    GI/Hepatic negative GI ROS, Neg liver ROS,   Endo/Other  negative endocrine ROS  Renal/GU negative Renal ROS  negative genitourinary   Musculoskeletal negative musculoskeletal ROS (+)   Abdominal Normal abdominal exam  (+)   Peds negative pediatric ROS (+)  Hematology negative hematology ROS (+)   Anesthesia Other Findings   Reproductive/Obstetrics                             Anesthesia Physical Anesthesia Plan  ASA: II  Anesthesia Plan: General   Post-op Pain Management:    Induction: Intravenous  Airway Management Planned: Simple Face Mask  Additional Equipment:   Intra-op Plan:   Post-operative Plan:   Informed Consent: I have reviewed the patients History and Physical, chart, labs and discussed the procedure including the risks, benefits and alternatives for the proposed anesthesia with the patient or authorized representative who has indicated his/her understanding and acceptance.   Dental advisory given  Plan Discussed with: CRNA and Surgeon  Anesthesia Plan Comments:         Anesthesia Quick Evaluation

## 2015-04-06 NOTE — Procedures (Signed)
ECT SERVICES Physician's Interval Evaluation & Treatment Note  Patient Identification: Gary Osborn MRN:  [id number] Date of Evaluation:  04/06/2015 TX #: [id number]  MADRS:   MMSE:   P.E. Findings:  No new physical complaints no new physical findings.  Psychiatric Interval Note:  Mood is good. No return of major depressive symptoms. Anxiety controlled.  Subjective:  Patient is a 81 y.o. male seen for evaluation for Electroconvulsive Therapy. Patient is requesting discontinuing the course of maintenance treatment. See notes below.  Treatment Summary:   [x]   Right Unilateral             []  Bilateral   % Energy : 0.3 ms 80%   Impedance: 1160 ohms  Seizure Energy Index: 3158 V squared  Postictal Suppression Index: 61%  Seizure Concordance Index: 84%  Medications  Pre Shock: Xylocaine 4 mg, Brevital 80 mg, succinylcholine 80 mg  Post Shock:    Seizure Duration: 40 seconds by EMG, 67 seconds by EEG   Comments: Patient is requesting that we stop maintenance treatment. He is lucid and understands the risks and benefits. We discussed attentional signs of return of depression including poor sleep and increased anxiety and depressed mood negativity. Also suicidality. Patient will continue follow-up with Dr. Casimiro Needle i who is his outpatient psychiatrist. He is aware that he can get in touch with [place] at any time if he needs to resume ECT. Under these circumstances we will not schedule another follow-up appointment at this time.   Lungs:  [x]   Clear to auscultation               []  Other:   Heart:    [x]   Regular rhythm             []  irregular rhythm    [x]   Previous H&P reviewed, patient examined and there are NO CHANGES                 []   Previous H&P reviewed, patient examined and there are changes noted.   Alethia Berthold, MD 6/24/201612:57 PM

## 2015-04-06 NOTE — Transfer of Care (Signed)
Immediate Anesthesia Transfer of Care Note  Patient: Gary Osborn  Procedure(s) Performed: ECT  Patient Location: PACU  Anesthesia Type:General  Level of Consciousness: sedated  Airway & Oxygen Therapy: Patient Spontanous Breathing and Patient connected to face mask oxygen  Post-op Assessment: Report given to RN and Post -op Vital signs reviewed and stable  Post vital signs: Reviewed and stable  Last Vitals:  Filed Vitals:   04/06/15 1026  BP: 113/65  Pulse: 54  Temp: 37.1 C  Resp: 18    Complications: No apparent anesthesia complications

## 2015-04-06 NOTE — Anesthesia Postprocedure Evaluation (Signed)
  Anesthesia Post-op Note  Patient: Gary Osborn  Procedure(s) Performed: * No procedures listed *  Anesthesia type:General  Patient location: PACU  Post pain: Pain level controlled  Post assessment: Post-op Vital signs reviewed, Patient's Cardiovascular Status Stable, Respiratory Function Stable, Patent Airway and No signs of Nausea or vomiting  Post vital signs: Reviewed and stable  Last Vitals:  Filed Vitals:   04/06/15 1354  BP: 120/72  Pulse: 56  Temp:   Resp: 18    Level of consciousness: awake, alert  and patient cooperative  Complications: No apparent anesthesia complications

## 2015-04-06 NOTE — Anesthesia Procedure Notes (Signed)
Date/Time: 04/06/2015 12:57 PM Performed by: Nelda Marseille Pre-anesthesia Checklist: Patient identified, Timeout performed, Emergency Drugs available, Suction available and Patient being monitored Oxygen Delivery Method: Ambu bag and Simple face mask

## 2015-04-13 DIAGNOSIS — C61 Malignant neoplasm of prostate: Secondary | ICD-10-CM | POA: Diagnosis not present

## 2015-04-20 DIAGNOSIS — N393 Stress incontinence (female) (male): Secondary | ICD-10-CM | POA: Diagnosis not present

## 2015-04-20 DIAGNOSIS — Z8546 Personal history of malignant neoplasm of prostate: Secondary | ICD-10-CM | POA: Diagnosis not present

## 2015-04-25 DIAGNOSIS — S51811A Laceration without foreign body of right forearm, initial encounter: Secondary | ICD-10-CM | POA: Diagnosis not present

## 2015-04-25 DIAGNOSIS — T1490 Injury, unspecified: Secondary | ICD-10-CM | POA: Diagnosis not present

## 2015-06-06 DIAGNOSIS — F251 Schizoaffective disorder, depressive type: Secondary | ICD-10-CM | POA: Diagnosis not present

## 2015-07-04 ENCOUNTER — Telehealth (HOSPITAL_COMMUNITY): Payer: Self-pay | Admitting: *Deleted

## 2015-07-04 NOTE — Telephone Encounter (Signed)
Called [employer] to check insurance authorization for ECT from June. [employer] is supplemental to [facility] and no authorization is required.

## 2015-07-06 ENCOUNTER — Encounter: Payer: Self-pay | Admitting: Internal Medicine

## 2015-07-06 ENCOUNTER — Ambulatory Visit (INDEPENDENT_AMBULATORY_CARE_PROVIDER_SITE_OTHER): Payer: Medicare Other | Admitting: Internal Medicine

## 2015-07-06 VITALS — BP 130/70 | HR 101 | Temp 98.3°F | Resp 20 | Ht 68.5 in | Wt 203.0 lb

## 2015-07-06 DIAGNOSIS — S20211A Contusion of right front wall of thorax, initial encounter: Secondary | ICD-10-CM | POA: Diagnosis not present

## 2015-07-06 DIAGNOSIS — Z23 Encounter for immunization: Secondary | ICD-10-CM

## 2015-07-06 DIAGNOSIS — F411 Generalized anxiety disorder: Secondary | ICD-10-CM

## 2015-07-06 NOTE — Progress Notes (Signed)
Pre visit review using our clinic review tool, if applicable. No additional management support is needed unless otherwise documented below in the visit note. 

## 2015-07-06 NOTE — Patient Instructions (Signed)
Call or return to clinic prn if these symptoms worsen or fail to improve as anticipated.  Return in one year for your annual exam

## 2015-07-06 NOTE — Progress Notes (Signed)
Subjective:    Patient ID: Gary Osborn, male    DOB: 1933/05/19, 81 y.o.   MRN: [id number]  HPI 16 year old patient who was moving into friend's home about 3 weeks ago.  He lost his balance, fell and sustained trauma to his right chest wall area.  He still has some minor tenderness but seems to be improving daily.  He has a history of major depression and anxiety disorder.  In general doing quite well.  Flu vaccine received today  Past Medical History  Diagnosis Date  . ALLERGIC RHINITIS 10/05/2007  . ANXIETY 10/05/2007  . COLONIC POLYPS, HX OF 10/05/2007  . DEPRESSION 10/04/2008  . PROSTATE CANCER, UNSPEC. 10/05/2007  . 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 Marital Status: Married    Spouse Name: N/A  . Number of Children: 2  . Years of Education: N/A   Occupational History  . Retired    Social History Main Topics  . Smoking status: Former Smoker    Types: Cigarettes    Quit date: 10/13/1973  . Smokeless tobacco: Never Used  . Alcohol Use: 16.8 oz/week    14 Standard drinks or equivalent, 14 Cans of beer per week     Comment: glass of chardonnay each night  . Drug Use: No  . Sexual Activity: Not on file   Other Topics Concern  . Not on file   Social History Narrative    Past Surgical History  Procedure Laterality Date  . Tonsillectomy    . Prostate surgery    . Nasal sinus surgery    . Singulotomy      for OCD    Family History  Problem Relation Age of Onset  . Prostate cancer Father   . Breast cancer Sister     No Known Allergies  Current Outpatient Prescriptions on File Prior to Visit  Medication Sig Dispense Refill  . busPIRone (BUSPAR) 10 MG tablet Take 10 mg by mouth 2 (two) times daily. Take 2 tabs twice daily    . clonazePAM (KLONOPIN) 0.5 MG tablet Take 1 tablet (0.5 mg total) by mouth 2 (two) times daily as needed. For severe anxiety 8 tablet 0  . magnesium oxide (MAG-OX) 400 MG tablet Take 400 mg by mouth  daily.    . mirtazapine (REMERON) 45 MG tablet Take 0.5 tablets (22.5 mg total) by mouth at bedtime. For depression 30 tablet 0  . PARoxetine (PAXIL) 40 MG tablet Take 1 tablet (40 mg total) by mouth at bedtime. For depression 30 tablet 0  . polyethylene glycol (MIRALAX / GLYCOLAX) packet Take 17 g by mouth.     No current facility-administered medications on file prior to visit.    BP 130/70 mmHg  Pulse 101  Temp(Src) 98.3 F (36.8 C) (Oral)  Resp 20  Ht 5' 8.5" (1.74 m)  Wt 203 lb (92.08 kg)  BMI 30.41 kg/m2  SpO2 97%      Review of Systems  Constitutional: Negative for fever, chills, appetite change and fatigue.  HENT: Negative for congestion, dental problem, ear pain, hearing loss, sore throat, tinnitus, trouble swallowing and voice change.   Eyes: Negative for pain, discharge and visual disturbance.  Respiratory: Negative for cough, chest tightness, shortness of breath, wheezing and stridor.   Cardiovascular: Positive for chest pain. Negative for palpitations and leg swelling.  Gastrointestinal: Negative for nausea, vomiting, abdominal pain, diarrhea, constipation, blood in stool and abdominal distention.  Genitourinary: Negative for urgency,  hematuria, flank pain, discharge, difficulty urinating and genital sores.  Musculoskeletal: Negative for myalgias, back pain, joint swelling, arthralgias, gait problem and neck stiffness.  Skin: Negative for rash.  Neurological: Negative for dizziness, syncope, speech difficulty, weakness, numbness and headaches.  Hematological: Negative for adenopathy. Does not bruise/bleed easily.  Psychiatric/Behavioral: Negative for behavioral problems and dysphoric mood. The patient is not nervous/anxious.        Objective:   Physical Exam  Constitutional: He is oriented to person, place, and time. He appears well-developed.  HENT:  Head: Normocephalic.  Right Ear: External ear normal.  Left Ear: External ear normal.  Eyes: Conjunctivae and  EOM are normal.  Neck: Normal range of motion.  Cardiovascular: Normal rate and normal heart sounds.   Pulmonary/Chest: Breath sounds normal. He exhibits no tenderness.  No chest wall tenderness or ecchymoses  Abdominal: Bowel sounds are normal.  Musculoskeletal: Normal range of motion. He exhibits no edema or tenderness.  Neurological: He is alert and oriented to person, place, and time.  Psychiatric: He has a normal mood and affect. His behavior is normal.          Assessment & Plan:   Resolving right chest wall contusion Unremarkable exam History of major depression, stable.  Psychiatric follow-up  Preventive health.  Flu vaccine administered  CPX one year

## 2015-07-22 ENCOUNTER — Other Ambulatory Visit (HOSPITAL_COMMUNITY): Payer: Self-pay | Admitting: Psychiatry

## 2015-07-25 ENCOUNTER — Other Ambulatory Visit (HOSPITAL_COMMUNITY): Payer: Self-pay | Admitting: Psychiatry

## 2015-07-26 ENCOUNTER — Encounter: Payer: Self-pay | Admitting: Adult Health

## 2015-07-26 ENCOUNTER — Ambulatory Visit (INDEPENDENT_AMBULATORY_CARE_PROVIDER_SITE_OTHER): Payer: Medicare Other | Admitting: Adult Health

## 2015-07-26 VITALS — BP 110/70 | Temp 98.2°F | Ht 68.5 in | Wt 199.6 lb

## 2015-07-26 DIAGNOSIS — Z5189 Encounter for other specified aftercare: Secondary | ICD-10-CM

## 2015-07-26 NOTE — Progress Notes (Signed)
Pre visit review using our clinic review tool, if applicable. No additional management support is needed unless otherwise documented below in the visit note. 

## 2015-07-26 NOTE — Progress Notes (Signed)
   Subjective:    Patient ID: NICKALOS PETERSEN, male    DOB: 04/21/1933, 81 y.o.   MRN: [id number]  HPI  81 year old male who presents to the office today for wound check. He endorses having a skin tear from hitting his arm on a door. This incident happened approx.2 weeks ago. He would like to make sure that his wound is not infected. He has been placing neosporin on his wound.   Denies any redness, warmth, or streaking.   Review of Systems  Constitutional: Negative.   HENT: Negative.   Skin: Positive for wound. Negative for color change.  All other systems reviewed and are negative.      Objective:   Physical Exam  Constitutional: He is oriented to person, place, and time. He appears well-developed and well-nourished. No distress.  Neurological: He is alert and oriented to person, place, and time.  Skin: Skin is warm and dry. No rash noted. He is not diaphoretic. No erythema. No pallor.  Small skin tear on right arm. Appears to be healing well. No signs of infection.   Psychiatric: He has a normal mood and affect. His behavior is normal. Judgment and thought content normal.  Nursing note and vitals reviewed.     Assessment & Plan:  1. Visit for wound check - Continue to apply neosporin on wound. Cover with bandage.  - Follow up with any signs of infection

## 2015-08-16 DIAGNOSIS — F251 Schizoaffective disorder, depressive type: Secondary | ICD-10-CM | POA: Diagnosis not present

## 2015-08-17 ENCOUNTER — Encounter: Payer: Self-pay | Admitting: Gastroenterology

## 2015-11-15 DIAGNOSIS — F251 Schizoaffective disorder, depressive type: Secondary | ICD-10-CM | POA: Diagnosis not present

## 2015-12-31 DIAGNOSIS — Z1283 Encounter for screening for malignant neoplasm of skin: Secondary | ICD-10-CM | POA: Diagnosis not present

## 2015-12-31 DIAGNOSIS — B079 Viral wart, unspecified: Secondary | ICD-10-CM | POA: Diagnosis not present

## 2016-02-11 ENCOUNTER — Other Ambulatory Visit (HOSPITAL_COMMUNITY): Payer: Self-pay | Admitting: Psychiatry

## 2016-03-19 DIAGNOSIS — F251 Schizoaffective disorder, depressive type: Secondary | ICD-10-CM | POA: Diagnosis not present

## 2016-05-21 ENCOUNTER — Ambulatory Visit (INDEPENDENT_AMBULATORY_CARE_PROVIDER_SITE_OTHER): Payer: Medicare Other | Admitting: Internal Medicine

## 2016-05-21 ENCOUNTER — Encounter: Payer: Self-pay | Admitting: Internal Medicine

## 2016-05-21 VITALS — HR 58

## 2016-05-21 DIAGNOSIS — Z86010 Personal history of colonic polyps: Secondary | ICD-10-CM

## 2016-05-21 DIAGNOSIS — Z0000 Encounter for general adult medical examination without abnormal findings: Secondary | ICD-10-CM | POA: Diagnosis not present

## 2016-05-21 DIAGNOSIS — C61 Malignant neoplasm of prostate: Secondary | ICD-10-CM

## 2016-05-21 DIAGNOSIS — E785 Hyperlipidemia, unspecified: Secondary | ICD-10-CM | POA: Diagnosis not present

## 2016-05-21 DIAGNOSIS — J3089 Other allergic rhinitis: Secondary | ICD-10-CM

## 2016-05-21 DIAGNOSIS — F332 Major depressive disorder, recurrent severe without psychotic features: Secondary | ICD-10-CM

## 2016-05-21 LAB — COMPREHENSIVE METABOLIC PANEL
ALK PHOS: 72 U/L (ref 39–117)
ALT: 27 U/L (ref 0–53)
AST: 25 U/L (ref 0–37)
Albumin: 4.3 g/dL (ref 3.5–5.2)
BILIRUBIN TOTAL: 0.6 mg/dL (ref 0.2–1.2)
BUN: 19 mg/dL (ref 6–23)
CALCIUM: 9.5 mg/dL (ref 8.4–10.5)
CO2: 32 meq/L (ref 19–32)
CREATININE: 1.04 mg/dL (ref 0.40–1.50)
Chloride: 101 mEq/L (ref 96–112)
GFR: 72.77 mL/min (ref >60.00)
GLUCOSE: 92 mg/dL (ref 70–99)
Potassium: 4.5 mEq/L (ref 3.5–5.1)
Sodium: 138 mEq/L (ref 135–145)
TOTAL PROTEIN: 6.8 g/dL (ref 6.0–8.3)

## 2016-05-21 LAB — LIPID PANEL
CHOL/HDL RATIO: 3
Cholesterol: 145 mg/dL (ref 0–200)
HDL: 48.5 mg/dL (ref >39.00)
LDL Cholesterol: 80 mg/dL (ref 0–99)
NONHDL: 96.03
TRIGLYCERIDES: 82 mg/dL (ref 0.0–149.0)
VLDL: 16.4 mg/dL (ref 0.0–40.0)

## 2016-05-21 LAB — CBC WITH DIFFERENTIAL/PLATELET
BASOS PCT: 0.4 % (ref 0.0–3.0)
Basophils Absolute: 0 10*3/uL (ref 0.0–0.1)
EOS ABS: 0.1 10*3/uL (ref 0.0–0.7)
Eosinophils Relative: 2.7 % (ref 0.0–5.0)
HCT: 39.8 % (ref 39.0–52.0)
Hemoglobin: 13.6 g/dL (ref 13.0–17.0)
LYMPHS ABS: 0.9 10*3/uL (ref 0.7–4.0)
Lymphocytes Relative: 23 % (ref 12.0–46.0)
MCHC: 34.1 g/dL (ref 30.0–36.0)
MCV: 96.4 fl (ref 78.0–100.0)
MONO ABS: 0.4 10*3/uL (ref 0.1–1.0)
Monocytes Relative: 10.4 % (ref 3.0–12.0)
NEUTROS ABS: 2.4 10*3/uL (ref 1.4–7.7)
NEUTROS PCT: 63.5 % (ref 43.0–77.0)
PLATELETS: 170 10*3/uL (ref 150.0–400.0)
RBC: 4.13 Mil/uL — ABNORMAL LOW (ref 4.22–5.81)
RDW: 13.8 % (ref 11.5–15.5)
WBC: 3.8 10*3/uL — ABNORMAL LOW (ref 4.0–10.5)

## 2016-05-21 LAB — POCT URINALYSIS DIPSTICK
BILIRUBIN UA: NEGATIVE
GLUCOSE UA: NEGATIVE
Ketones, UA: NEGATIVE
Leukocytes, UA: NEGATIVE
Nitrite, UA: NEGATIVE
Protein, UA: NEGATIVE
RBC UA: NEGATIVE
SPEC GRAV UA: 1.01
Urobilinogen, UA: 0.2
pH, UA: 6.5

## 2016-05-21 LAB — TSH: TSH: 4 u[IU]/mL (ref 0.35–4.50)

## 2016-05-21 NOTE — Patient Instructions (Signed)
It is important that you exercise regularly, at least 20 minutes 3 to 4 times per week.  If you develop chest pain or shortness of breath seek  medical attention.  You need to lose weight.  Consider a lower calorie diet and regular exercise. 

## 2016-05-21 NOTE — Progress Notes (Signed)
Pre visit review using our clinic review tool, if applicable. No additional management support is needed unless otherwise documented below in the visit note. 

## 2016-05-21 NOTE — Progress Notes (Signed)
Subjective:    Patient ID: EUELL SANTORELLI, male    DOB: [birth year], 82 y.o.   MRN: [id number]  HPI    Subjective:    Patient ID: PRISH TRIMPER, male    DOB: 12/26/1932, 82 y.o.   MRN: [id number]  HPI   Subjective:    Patient ID: YADIEL RUSHIN, male    DOB: February 06, 1933,    MRN: [id number]  HPIHistory of Present Illness:   82   year old patient who is in today for a health maintenance examination.  He  has a history of prostate cancer and is followed by urology. He is doing quite well today.  He was seen by urology in June and states that he was released   He has a history of MDD and was hospitalized in November of 2015  after an unsuccessful suicide attempt.  He has received outpatient ECT and is followed closely by psychiatry.  Hospital records and laboratory studies were reviewed  Last colonoscopy 2006  Social history.  He and his wife reside at friend's home   Here for Medicare AWV:   1. Risk factors based on Past M, S, F history: is factors include age  83. Physical Activities:.  Little physical activity of late, but no exercise restrictions  3. Depression/mood:History of MDD with multiple psychiatric admissions.  Presently receiving outpatient ECT 4. Hearing: no significant deficit  5. ADL's: remains independent in all aspects of daily living  6. Fall Risk: low  7. Home Safety: no problem identified  8. Height, weight, &visual acuity:height and weight stable. No difficulty with visual acuity did have a recent eye examination. This past week  9. Counseling: heart healthy diet, exercise regimen, and modest weight loss. All encouraged  10. Labsreviewed from recent hospital admission  11. Referral Coordination- follow-up urology, and GI , As well as psychiatry 12. Care Plan-  urology and follow-up  13. Cognitive Assessment- alert and oriented, with normal affect. No cognitive dysfunction  14.  Preventive services will include annual health examinations with screening lab.   He will see urology on annual basis due to age.  No further screening colonoscopies will be performed.  Annual eye examinations recommended 15.  Provider list update includes primary care psychiatry and urology as well as ophthalmology   Allergies (verified):  No Known Drug Allergies   Past History:  Past Medical History:   Anxiety  Colonic polyps, hx of  history of prostate cancer  Allergic rhinitis  Depression  sinus bradycardia   Past Surgical History:   radical prostatectomy April 2008  Sinus surgery septoplasty June 2004  Tonsillectomy age 89  psychotherapy in the 70s  colonoscopy December 2006   Family History:   father died age 76, prostate cancer  mother died age 65  One sister, history of breast cancer   Social History:   two children, one daughter with  bipolar depression     Wt Readings from Last 3 Encounters:  10/08/11 207 lb (93.895 kg)  03/11/11 197 lb (89.359 kg)  02/10/11 193 lb (87.544 kg)     Review of Systems  Constitutional: Negative for fever, chills, activity change, appetite change and fatigue.  HENT: Negative for hearing loss, ear pain, congestion, rhinorrhea, sneezing, mouth sores, trouble swallowing, neck pain, neck stiffness, dental problem, voice change, sinus pressure and tinnitus.   Eyes: Negative for photophobia, pain, redness and visual disturbance.  Respiratory: Negative for apnea, cough, choking, chest tightness, shortness of breath and wheezing.  Cardiovascular: Negative for chest pain, palpitations and leg swelling.  Gastrointestinal: Negative for nausea, vomiting, abdominal pain, diarrhea, constipation, blood in stool, abdominal distention, anal bleeding and rectal pain.  Genitourinary: Negative for dysuria, urgency, frequency, hematuria, flank pain, decreased urine volume, discharge, penile swelling, scrotal swelling, difficulty urinating (Some urinary incontinence following prostatectomy), genital sores and testicular pain.    Musculoskeletal: Negative for myalgias, back pain, joint swelling, arthralgias and gait problem.  Skin: Negative for color change, rash and wound.  Neurological: Negative for dizziness, tremors, seizures, syncope, facial asymmetry, speech difficulty, weakness, light-headedness, numbness and headaches.  Hematological: Negative for adenopathy. Does not bruise/bleed easily.  Psychiatric/Behavioral: Negative for suicidal ideas, hallucinations, behavioral problems, confusion, sleep disturbance, self-injury, dysphoric mood, decreased concentration and agitation. The patient is not nervous/anxious.        Objective:   Physical Exam  Constitutional: He appears well-developed and well-nourished.  HENT:  Head: Normocephalic and atraumatic.  Right Ear: External ear normal.  Left Ear: External ear normal.  Nose: Nose normal.  Mouth/Throat: Oropharynx is clear and moist.  Eyes: Conjunctivae and EOM are normal. Pupils are equal, round, and reactive to light. No scleral icterus.  Neck: Normal range of motion. Neck supple. No JVD present. No thyromegaly present.  Cardiovascular: Regular rhythm, normal heart sounds and intact distal pulses.  Exam reveals no gallop and no friction rub.   No murmur heard. Pulmonary/Chest: Effort normal and breath sounds normal. He exhibits no tenderness.  Abdominal: Soft. Bowel sounds are normal. He exhibits no distension and no mass. There is no tenderness.  Genitourinary: Penis normal.  Musculoskeletal: Normal range of motion. He exhibits no edema and no tenderness.  Lymphadenopathy:    He has no cervical adenopathy.  Neurological: He is alert. He has normal reflexes. No cranial nerve deficit. Coordination normal.  Skin: Skin is warm and dry. No rash noted.  Psychiatric: He has a normal mood and affect. His behavior is normal.          Assessment & Plan:    Preventive health examination Status post prostatectomy Depression- stable Colonic polyps  Will  check updated labs. Followup urology and GI as scheduled. Return here in one year or as needed   Review of Systems as above     Objective:   Physical Exam  Testicular atrophy       Assessment & Plan:   Preventive health examination History colonic polyps History Maj. depression. Continue psychiatric and psychology followup History of prostate cancer. Continue annual urology followup  More regular exercise recommended Recheck one year  Review of Systems  As above   Objective:   Physical Exam  Abdominal: Soft. Bowel sounds are normal.  Obese  Genitourinary:  Genitourinary Comments: Wearing an adult diaper  Musculoskeletal:  Trigger finger left fourth finger Hammertoe deformities with high arches          Assessment & Plan:   Preventive health examination  MDD.  Psychiatric follow-up  History colonic polyps  Anxiety disorder   Laboratory studies reviewed  Psychiatric follow-up   Return here in one year or as needed   Nyoka Cowden, MD

## 2016-05-29 ENCOUNTER — Other Ambulatory Visit (HOSPITAL_COMMUNITY): Payer: Self-pay | Admitting: Psychiatry

## 2016-06-17 DIAGNOSIS — F251 Schizoaffective disorder, depressive type: Secondary | ICD-10-CM | POA: Diagnosis not present

## 2016-07-24 DIAGNOSIS — Z23 Encounter for immunization: Secondary | ICD-10-CM | POA: Diagnosis not present

## 2016-10-22 DIAGNOSIS — D1801 Hemangioma of skin and subcutaneous tissue: Secondary | ICD-10-CM | POA: Diagnosis not present

## 2016-10-22 DIAGNOSIS — L570 Actinic keratosis: Secondary | ICD-10-CM | POA: Diagnosis not present

## 2016-10-22 DIAGNOSIS — L814 Other melanin hyperpigmentation: Secondary | ICD-10-CM | POA: Diagnosis not present

## 2016-11-11 DIAGNOSIS — R040 Epistaxis: Secondary | ICD-10-CM | POA: Diagnosis not present

## 2016-11-13 DIAGNOSIS — F251 Schizoaffective disorder, depressive type: Secondary | ICD-10-CM | POA: Diagnosis not present

## 2016-11-26 DIAGNOSIS — L814 Other melanin hyperpigmentation: Secondary | ICD-10-CM | POA: Diagnosis not present

## 2016-11-26 DIAGNOSIS — L570 Actinic keratosis: Secondary | ICD-10-CM | POA: Diagnosis not present

## 2016-11-26 DIAGNOSIS — D1801 Hemangioma of skin and subcutaneous tissue: Secondary | ICD-10-CM | POA: Diagnosis not present

## 2017-01-13 ENCOUNTER — Ambulatory Visit (INDEPENDENT_AMBULATORY_CARE_PROVIDER_SITE_OTHER): Payer: Medicare Other | Admitting: Internal Medicine

## 2017-01-13 ENCOUNTER — Encounter: Payer: Self-pay | Admitting: Internal Medicine

## 2017-01-13 DIAGNOSIS — R040 Epistaxis: Secondary | ICD-10-CM

## 2017-01-13 DIAGNOSIS — J301 Allergic rhinitis due to pollen: Secondary | ICD-10-CM | POA: Diagnosis not present

## 2017-01-13 NOTE — Progress Notes (Signed)
   Subjective:    Patient ID: ESSEX PERRY, male    DOB: 04/13/1933, 83 y.o.   MRN: [id number]  HPI  83 year old patient who has a history of allergic rhinitis and has had some issues with epistaxis in the past.  He has had ENT evaluation and has also had cautery performed. He had the onset of right-sided epistaxis one hour prior to his walk-in visit.  This occurred after local trauma. Review of Systems  Constitutional: Negative for appetite change, chills, fatigue and fever.  HENT: Positive for nosebleeds. Negative for congestion, dental problem, ear pain, hearing loss, sore throat, tinnitus, trouble swallowing and voice change.   Eyes: Negative for pain, discharge and visual disturbance.  Respiratory: Negative for cough, chest tightness, wheezing and stridor.   Cardiovascular: Negative for chest pain, palpitations and leg swelling.  Gastrointestinal: Negative for abdominal distention, abdominal pain, blood in stool, constipation, diarrhea, nausea and vomiting.  Genitourinary: Negative for difficulty urinating, discharge, flank pain, genital sores, hematuria and urgency.  Musculoskeletal: Negative for arthralgias, back pain, gait problem, joint swelling, myalgias and neck stiffness.  Skin: Negative for rash.  Neurological: Negative for dizziness, syncope, speech difficulty, weakness, numbness and headaches.  Hematological: Negative for adenopathy. Does not bruise/bleed easily.  Psychiatric/Behavioral: Negative for behavioral problems and dysphoric mood. The patient is not nervous/anxious.        Objective:   Physical Exam  Constitutional: He appears well-developed and well-nourished. No distress.  Alert No distress On arrival, active epistaxis from right nares          Assessment & Plan:   Recurrent epistaxis.  After prolonged pressure, the bleeding resolved.  Bleeding was noted to emanate from the mid right septal  Area.  This area was gently cauterized chemically  Local  wound care discussed Patient information dispensed  Nyoka Cowden

## 2017-01-13 NOTE — Patient Instructions (Addendum)
Nosebleed, Adult A nosebleed is when blood comes out of the nose. Nosebleeds are common. Usually, they are not a sign of a serious condition. Nosebleeds can happen if a small blood vessel in your nose starts to bleed or if the lining of your nose (mucous membrane) cracks. They are commonly caused by:  Allergies.  Colds.  Picking your nose.  Blowing your nose too hard.  An injury from sticking an object into your nose or getting hit in the nose.  Dry or cold air. Less common causes of nosebleeds include:  Toxic fumes.  Something abnormal in the nose or in the air-filled spaces in the bones of the face (sinuses).  Growths in the nose, such as polyps.  Medicines or conditions that cause blood to clot slowly.  Certain illnesses or procedures that irritate or dry out the nasal passages. Follow these instructions at home: When you have a nosebleed:   Sit down and tilt your head slightly forward.  Use a clean towel or tissue to pinch your nostrils under the bony part of your nose. After 10 minutes, let go of your nose and see if bleeding starts again. Do not release pressure before that time. If there is still bleeding, repeat the pinching and holding for 10 minutes until the bleeding stops.  Do not place tissues or gauze in the nose to stop bleeding.  Avoid lying down and avoid tilting your head backward. That may make blood collect in the throat and cause gagging or coughing.  Use a nasal spray decongestant to help with a nosebleed as told by your health care provider.  Do not use petroleum jelly or mineral oil in your nose. It can drip into your lungs. After a nosebleed:   Avoid blowing your nose or sniffing for a number of hours.  Avoid straining, lifting, or bending at the waist for several days. You may resume other normal activities as you are able.  Use saline spray or a humidifier as told by your health care provider.  Aspirinand blood thinners make bleeding more  likely. If you are prescribed these medicines and you suffer from nosebleeds:  Ask your health care provider if you should stop taking the medicines or if you should adjust the dose.  Do not stop taking medicines that your health care provider has recommended unless told by your health care provider.  If your nosebleed was caused by dry mucous membranes, use over-the-counter saline nasal spray or gel. This will keep the mucous membranes moist and allow them to heal. If you must use a lubricant:  Choose one that is water-soluble.  Use only as much as you need and use it only as often as needed.  Do not lie down until several hours after you use it. Contact a health care provider if:  You have a fever.  You get nosebleeds often or more often than usual.  You bruise very easily.  You have a nosebleed from having something stuck in your nose.  You have bleeding in your mouth.  You vomit or cough up brown material.  You have a nosebleed after you start a new medicine. Get help right away if:  You have a nosebleed after a fall or a head injury.  Your nosebleed does not go away after 20 minutes.  You feel dizzy or weak.  You have unusual bleeding from other parts of your body.  You have unusual bruising on other parts of your body.  You become sweaty.  You vomit blood. This information is not intended to replace advice given to you by your health care provider. Make sure you discuss any questions you have with your health care provider. Document Released: 07/09/2005 Document Revised: 05/29/2016 Document Reviewed: 04/15/2016 Elsevier Interactive Patient Education  2017 [employer].

## 2017-02-18 DIAGNOSIS — L570 Actinic keratosis: Secondary | ICD-10-CM | POA: Diagnosis not present

## 2017-02-18 DIAGNOSIS — L304 Erythema intertrigo: Secondary | ICD-10-CM | POA: Diagnosis not present

## 2017-03-25 DIAGNOSIS — L814 Other melanin hyperpigmentation: Secondary | ICD-10-CM | POA: Diagnosis not present

## 2017-03-25 DIAGNOSIS — L570 Actinic keratosis: Secondary | ICD-10-CM | POA: Diagnosis not present

## 2017-03-25 DIAGNOSIS — L304 Erythema intertrigo: Secondary | ICD-10-CM | POA: Diagnosis not present

## 2017-04-14 DIAGNOSIS — F251 Schizoaffective disorder, depressive type: Secondary | ICD-10-CM | POA: Diagnosis not present

## 2017-05-22 ENCOUNTER — Ambulatory Visit (INDEPENDENT_AMBULATORY_CARE_PROVIDER_SITE_OTHER): Payer: Medicare Other | Admitting: Internal Medicine

## 2017-05-22 ENCOUNTER — Encounter: Payer: Self-pay | Admitting: Internal Medicine

## 2017-05-22 VITALS — BP 124/80 | HR 74 | Temp 98.5°F | Ht 68.5 in | Wt 227.4 lb

## 2017-05-22 DIAGNOSIS — C61 Malignant neoplasm of prostate: Secondary | ICD-10-CM

## 2017-05-22 DIAGNOSIS — J301 Allergic rhinitis due to pollen: Secondary | ICD-10-CM

## 2017-05-22 DIAGNOSIS — E785 Hyperlipidemia, unspecified: Secondary | ICD-10-CM | POA: Diagnosis not present

## 2017-05-22 DIAGNOSIS — F333 Major depressive disorder, recurrent, severe with psychotic symptoms: Secondary | ICD-10-CM

## 2017-05-22 DIAGNOSIS — R7989 Other specified abnormal findings of blood chemistry: Secondary | ICD-10-CM

## 2017-05-22 DIAGNOSIS — R002 Palpitations: Secondary | ICD-10-CM

## 2017-05-22 DIAGNOSIS — R946 Abnormal results of thyroid function studies: Secondary | ICD-10-CM | POA: Diagnosis not present

## 2017-05-22 LAB — COMPREHENSIVE METABOLIC PANEL
ALT: 32 U/L (ref 0–53)
AST: 24 U/L (ref 0–37)
Albumin: 4.5 g/dL (ref 3.5–5.2)
Alkaline Phosphatase: 78 U/L (ref 39–117)
BUN: 20 mg/dL (ref 6–23)
CALCIUM: 9.2 mg/dL (ref 8.4–10.5)
CHLORIDE: 104 meq/L (ref 96–112)
CO2: 30 meq/L (ref 19–32)
Creatinine, Ser: 1.1 mg/dL (ref 0.40–1.50)
GFR: 68.04 mL/min (ref >60.00)
Glucose, Bld: 95 mg/dL (ref 70–99)
Potassium: 4.5 mEq/L (ref 3.5–5.1)
Sodium: 139 mEq/L (ref 135–145)
Total Bilirubin: 0.6 mg/dL (ref 0.2–1.2)
Total Protein: 6.7 g/dL (ref 6.0–8.3)

## 2017-05-22 LAB — LIPID PANEL
CHOL/HDL RATIO: 3
Cholesterol: 157 mg/dL (ref 0–200)
HDL: 49.6 mg/dL (ref >39.00)
LDL CALC: 87 mg/dL (ref 0–99)
NonHDL: 106.91
TRIGLYCERIDES: 102 mg/dL (ref 0.0–149.0)
VLDL: 20.4 mg/dL (ref 0.0–40.0)

## 2017-05-22 LAB — CBC WITH DIFFERENTIAL/PLATELET
BASOS PCT: 1.4 % (ref 0.0–3.0)
Basophils Absolute: 0 10*3/uL (ref 0.0–0.1)
EOS PCT: 3.3 % (ref 0.0–5.0)
Eosinophils Absolute: 0.1 10*3/uL (ref 0.0–0.7)
HCT: 41.4 % (ref 39.0–52.0)
HEMOGLOBIN: 13.8 g/dL (ref 13.0–17.0)
LYMPHS PCT: 25.3 % (ref 12.0–46.0)
Lymphs Abs: 0.8 10*3/uL (ref 0.7–4.0)
MCHC: 33.3 g/dL (ref 30.0–36.0)
MCV: 99.8 fl (ref 78.0–100.0)
Monocytes Absolute: 0.3 10*3/uL (ref 0.1–1.0)
Monocytes Relative: 10.4 % (ref 3.0–12.0)
Neutro Abs: 1.8 10*3/uL (ref 1.4–7.7)
Neutrophils Relative %: 59.6 % (ref 43.0–77.0)
Platelets: 167 10*3/uL (ref 150.0–400.0)
RBC: 4.14 Mil/uL — ABNORMAL LOW (ref 4.22–5.81)
RDW: 13.8 % (ref 11.5–15.5)
WBC: 3 10*3/uL — AB (ref 4.0–10.5)

## 2017-05-22 LAB — PSA: PSA: 0 ng/mL — ABNORMAL LOW (ref 0.10–4.00)

## 2017-05-22 LAB — TSH: TSH: 7.18 u[IU]/mL — ABNORMAL HIGH (ref 0.35–4.50)

## 2017-05-22 NOTE — Progress Notes (Signed)
Subjective:    Patient ID: Gary Osborn, male    DOB: Oct 25, 1932, 83 y.o.   MRN: [id number]  HPI  83 year old patient who is seen today for a annual exam and subsequent Medicare wellness visit He continues to be followed by psychiatry and does have a history of major depression. He has remote history of prostate cancer status post surgery and has been released by urology Over the past year.  There has been some significant weight gain  Social history resident at friend's home.  Does drink beer daily and eats out several times per week Medical regimen does include Remeron  Past Medical History:  Diagnosis Date  . ALLERGIC RHINITIS 10/05/2007  . ANXIETY 10/05/2007  . COLONIC POLYPS, HX OF 10/05/2007  . DEPRESSION 10/04/2008  . PROSTATE CANCER, UNSPEC. 10/05/2007  . 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 Marital status: Married    Spouse name: N/A  . Number of children: 2  . Years of education: N/A   Occupational History  . Retired Retired   Social History Main Topics  . Smoking status: Former Smoker    Types: Cigarettes    Quit date: 10/13/1973  . Smokeless tobacco: Never Used  . Alcohol use 16.8 oz/week    14 Standard drinks or equivalent, 14 Cans of beer per week     Comment: glass of chardonnay each night  . Drug use: No  . Sexual activity: Not on file   Other Topics Concern  . Not on file   Social History Narrative  . No narrative on file    Past Surgical History:  Procedure Laterality Date  . NASAL SINUS SURGERY    . PROSTATE SURGERY    . singulotomy     for OCD  . TONSILLECTOMY      Family History  Problem Relation Age of Onset  . Prostate cancer Father   . Breast cancer Sister     No Known Allergies  Current Outpatient Prescriptions on File Prior to Visit  Medication Sig Dispense Refill  . busPIRone (BUSPAR) 10 MG tablet Take 10 mg by mouth 2 (two) times daily. Take 2 tabs twice daily    . clonazePAM  (KLONOPIN) 0.5 MG tablet Take 1 tablet (0.5 mg total) by mouth 2 (two) times daily as needed. For severe anxiety 8 tablet 0  . docusate sodium (COLACE) 100 MG capsule Take 100 mg by mouth daily.    . mirtazapine (REMERON) 45 MG tablet Take 0.5 tablets (22.5 mg total) by mouth at bedtime. For depression 30 tablet 0  . PARoxetine (PAXIL) 40 MG tablet Take 1 tablet (40 mg total) by mouth at bedtime. For depression 30 tablet 0  . polyethylene glycol (MIRALAX / GLYCOLAX) packet Take 17 g by mouth.     No current facility-administered medications on file prior to visit.     BP (!) 144/88 (BP Location: Left Arm, Patient Position: Sitting, Cuff Size: Normal)   Pulse 74   Temp 98.5 F (36.9 C) (Oral)   Ht 5' 8.5" (1.74 m)   Wt 227 lb 6.4 oz (103.1 kg)   SpO2 96%   BMI 34.07 kg/m   Medicare wellness visit  1. Risk factors, based on past  M,S,F history.  No major cardiac risk factors except age and sex  2.  Physical activities:walks daily, but fairly sedentary  3.  Depression/mood:history major depression followed by psychiatry  4.  Hearing:no major deficits  5.  ADL's:independent  6.  Fall risk:low.  No falls over the past year  7.  Home safety:no problems identified.  Resides at friend's home  8.  Height weight, and visual acuity;height and weight stable no change in visual acuity  9.  Counseling:more exercise.  Modest weight loss encouraged  10. Lab orders based on risk factors:laboratory update will be reviewed.  Will review PSA 11. Referral :follow-up psychiatry  12. Care plan:continue efforts at aggressive risk factor modification  13. Cognitive assessment: alert and oriented with normal affect.  No cognitive dysfunction  14. Screening: Patient provided with a written and personalized 5-10 year screening schedule in the AVS.    15. Provider List Update: primary care psychiatry ophthalmology   Review of Systems  Constitutional: Positive for unexpected weight change.  Negative for appetite change, chills, fatigue and fever.  HENT: Negative for congestion, dental problem, ear pain, hearing loss, sore throat, tinnitus, trouble swallowing and voice change.   Eyes: Negative for pain, discharge and visual disturbance.  Respiratory: Negative for cough, chest tightness, wheezing and stridor.   Cardiovascular: Negative for chest pain, palpitations and leg swelling.  Gastrointestinal: Negative for abdominal distention, abdominal pain, blood in stool, constipation, diarrhea, nausea and vomiting.  Genitourinary: Negative for difficulty urinating, discharge, flank pain, genital sores, hematuria and urgency.  Musculoskeletal: Negative for arthralgias, back pain, gait problem, joint swelling, myalgias and neck stiffness.  Skin: Negative for rash.  Neurological: Negative for dizziness, syncope, speech difficulty, weakness, numbness and headaches.  Hematological: Negative for adenopathy. Does not bruise/bleed easily.  Psychiatric/Behavioral: Negative for behavioral problems and dysphoric mood. The patient is not nervous/anxious.        Objective:   Physical Exam  Constitutional: He is oriented to person, place, and time. He appears well-developed.  Weight 227 Repeat blood pressure 124/80  HENT:  Head: Normocephalic.  Right Ear: External ear normal.  Left Ear: External ear normal.  Eyes: Conjunctivae and EOM are normal.  Neck: Normal range of motion.  Cardiovascular: Normal rate, normal heart sounds and intact distal pulses.   Pedal pulses full  Pulmonary/Chest: Breath sounds normal.  Abdominal: Bowel sounds are normal.  Genitourinary: Rectal exam shows guaiac negative stool.  Genitourinary Comments: Uses adult diaper Status post prostatectomy  Musculoskeletal: Normal range of motion. He exhibits no edema or tenderness.  Neurological: He is alert and oriented to person, place, and time.  Psychiatric: He has a normal mood and affect. His behavior is normal.           Assessment & Plan:   Subsequent Medicare wellness visit Major depression.  Follow-up psychiatry History of prostate cancer.  Will check PSA Weight gain.  More rigorous exercise, calorie restriction discussed.  Will place on a DASH eating plan  Follow-up one year Review screening lab  [facility]

## 2017-05-22 NOTE — Patient Instructions (Addendum)
It is important that you exercise regularly, at least 20 minutes 3 to 4 times per week.  If you develop chest pain or shortness of breath seek  medical attention.  You need to lose weight.  Consider a lower calorie diet and regular exercise.   DASH Eating Plan DASH stands for "Dietary Approaches to Stop Hypertension." The DASH eating plan is a healthy eating plan that has been shown to reduce high blood pressure (hypertension). It may also reduce your risk for type 2 diabetes, heart disease, and stroke. The DASH eating plan may also help with weight loss. What are tips for following this plan? General guidelines  Avoid eating more than 2,300 mg (milligrams) of salt (sodium) a day. If you have hypertension, you may need to reduce your sodium intake to 1,500 mg a day.  Limit alcohol intake to no more than 1 drink a day for nonpregnant women and 2 drinks a day for men. One drink equals 12 oz of beer, 5 oz of wine, or 1 oz of hard liquor.  Work with your health care provider to maintain a healthy body weight or to lose weight. Ask what an ideal weight is for you.  Get at least 30 minutes of exercise that causes your heart to beat faster (aerobic exercise) most days of the week. Activities may include walking, swimming, or biking.  Work with your health care provider or diet and nutrition specialist (dietitian) to adjust your eating plan to your individual calorie needs. Reading food labels  Check food labels for the amount of sodium per serving. Choose foods with less than 5 percent of the Daily Value of sodium. Generally, foods with less than 300 mg of sodium per serving fit into this eating plan.  To find whole grains, look for the word "whole" as the first word in the ingredient list. Shopping  Buy products labeled as "low-sodium" or "no salt added."  Buy fresh foods. Avoid canned foods and premade or frozen meals. Cooking  Avoid adding salt when cooking. Use salt-free seasonings or  herbs instead of table salt or sea salt. Check with your health care provider or pharmacist before using salt substitutes.  Do not fry foods. Cook foods using healthy methods such as baking, boiling, grilling, and broiling instead.  Cook with heart-healthy oils, such as olive, canola, soybean, or sunflower oil. Meal planning   Eat a balanced diet that includes: ? 5 or more servings of fruits and vegetables each day. At each meal, try to fill half of your plate with fruits and vegetables. ? Up to 6-8 servings of whole grains each day. ? Less than 6 oz of lean meat, poultry, or fish each day. A 3-oz serving of meat is about the same size as a deck of cards. One egg equals 1 oz. ? 2 servings of low-fat dairy each day. ? A serving of nuts, seeds, or beans 5 times each week. ? Heart-healthy fats. Healthy fats called Omega-3 fatty acids are found in foods such as flaxseeds and coldwater fish, like sardines, salmon, and mackerel.  Limit how much you eat of the following: ? Canned or prepackaged foods. ? Food that is high in trans fat, such as fried foods. ? Food that is high in saturated fat, such as fatty meat. ? Sweets, desserts, sugary drinks, and other foods with added sugar. ? Full-fat dairy products.  Do not salt foods before eating.  Try to eat at least 2 vegetarian meals each week.  Eat more  home-cooked food and less restaurant, buffet, and fast food.  When eating at a restaurant, ask that your food be prepared with less salt or no salt, if possible. What foods are recommended? The items listed may not be a complete list. Talk with your dietitian about what dietary choices are best for you. Grains Whole-grain or whole-wheat bread. Whole-grain or whole-wheat pasta. Brown rice. [place]. Bulgur. Whole-grain and low-sodium cereals. Pita bread. Low-fat, low-sodium crackers. Whole-wheat flour tortillas. Vegetables Fresh or frozen vegetables (raw, steamed, roasted, or grilled).  Low-sodium or reduced-sodium tomato and vegetable juice. Low-sodium or reduced-sodium tomato sauce and tomato paste. Low-sodium or reduced-sodium canned vegetables. Fruits All fresh, dried, or frozen fruit. Canned fruit in natural juice (without added sugar). Meat and other protein foods Skinless chicken or [place]. Ground chicken or [place]. Pork with fat trimmed off. Fish and seafood. Egg whites. Dried beans, peas, or lentils. Unsalted nuts, nut butters, and seeds. Unsalted canned beans. Lean cuts of beef with fat trimmed off. Low-sodium, lean deli meat. Dairy Low-fat (1%) or fat-free (skim) milk. Fat-free, low-fat, or reduced-fat cheeses. Nonfat, low-sodium ricotta or cottage cheese. Low-fat or nonfat yogurt. Low-fat, low-sodium cheese. Fats and oils Soft margarine without trans fats. Vegetable oil. Low-fat, reduced-fat, or light mayonnaise and salad dressings (reduced-sodium). Canola, safflower, olive, soybean, and sunflower oils. Avocado. Seasoning and other foods Herbs. Spices. Seasoning mixes without salt. Unsalted popcorn and pretzels. Fat-free sweets. What foods are not recommended? The items listed may not be a complete list. Talk with your dietitian about what dietary choices are best for you. Grains Baked goods made with fat, such as croissants, muffins, or some breads. Dry pasta or rice meal packs. Vegetables Creamed or fried vegetables. Vegetables in a cheese sauce. Regular canned vegetables (not low-sodium or reduced-sodium). Regular canned tomato sauce and paste (not low-sodium or reduced-sodium). Regular tomato and vegetable juice (not low-sodium or reduced-sodium). Angie Fava. Olives. Fruits Canned fruit in a light or heavy syrup. Fried fruit. Fruit in cream or butter sauce. Meat and other protein foods Fatty cuts of meat. Ribs. Fried meat. Berniece Salines. Sausage. Bologna and other processed lunch meats. Salami. Fatback. Hotdogs. Bratwurst. Salted nuts and seeds. Canned beans with added  salt. Canned or smoked fish. Whole eggs or egg yolks. Chicken or [place] with skin. Dairy Whole or 2% milk, cream, and half-and-half. Whole or full-fat cream cheese. Whole-fat or sweetened yogurt. Full-fat cheese. Nondairy creamers. Whipped toppings. Processed cheese and cheese spreads. Fats and oils Butter. Stick margarine. Lard. Shortening. Ghee. Bacon fat. Tropical oils, such as coconut, palm kernel, or palm oil. Seasoning and other foods Salted popcorn and pretzels. Onion salt, garlic salt, seasoned salt, table salt, and sea salt. Worcestershire sauce. Tartar sauce. Barbecue sauce. Teriyaki sauce. Soy sauce, including reduced-sodium. Steak sauce. Canned and packaged gravies. Fish sauce. Oyster sauce. Cocktail sauce. Horseradish that you find on the shelf. Ketchup. Mustard. Meat flavorings and tenderizers. Bouillon cubes. Hot sauce and Tabasco sauce. Premade or packaged marinades. Premade or packaged taco seasonings. Relishes. Regular salad dressings. Where to find more information:  National Heart, Lung, and [place]: https://wilson-eaton.com/  American Heart Association: www.heart.org Summary  The DASH eating plan is a healthy eating plan that has been shown to reduce high blood pressure (hypertension). It may also reduce your risk for type 2 diabetes, heart disease, and stroke.  With the DASH eating plan, you should limit salt (sodium) intake to 2,300 mg a day. If you have hypertension, you may need to reduce your sodium intake to 1,500  mg a day.  When on the DASH eating plan, aim to eat more fresh fruits and vegetables, whole grains, lean proteins, low-fat dairy, and heart-healthy fats.  Work with your health care provider or diet and nutrition specialist (dietitian) to adjust your eating plan to your individual calorie needs. This information is not intended to replace advice given to you by your health care provider. Make sure you discuss any questions you have with your health care  provider. Document Released: 09/18/2011 Document Revised: 09/22/2016 Document Reviewed: 09/22/2016 Elsevier Interactive Patient Education  2017 [employer].

## 2017-05-25 ENCOUNTER — Telehealth: Payer: Self-pay | Admitting: Internal Medicine

## 2017-05-25 NOTE — Addendum Note (Signed)
Addended by: Abelardo Diesel on: 05/25/2017 08:43 AM   Modules accepted: Orders

## 2017-05-25 NOTE — Telephone Encounter (Signed)
Spoke with pt

## 2017-05-25 NOTE — Telephone Encounter (Signed)
Patient is returning Paty's phone call.  Looks to be for labs.

## 2017-06-30 ENCOUNTER — Ambulatory Visit: Payer: Medicare Other

## 2017-07-02 ENCOUNTER — Telehealth: Payer: Self-pay

## 2017-07-02 NOTE — Telephone Encounter (Signed)
Patient called to report that several days ago he hit his pinky toe on a table leg. The toe then became red, bruised and sore. He is walking without difficulty but does still have some pain. He wanted advice on treatment options. Advised pt that there really is no treatment available, he can buddy-tape toe to next one, elevate and take Tylenol prn for pain. He will watch for next few days and call for OV if not improving. Nothing further needed at this time.

## 2017-07-22 DIAGNOSIS — Z23 Encounter for immunization: Secondary | ICD-10-CM | POA: Diagnosis not present

## 2017-07-24 ENCOUNTER — Other Ambulatory Visit (INDEPENDENT_AMBULATORY_CARE_PROVIDER_SITE_OTHER): Payer: Medicare Other

## 2017-07-24 DIAGNOSIS — R946 Abnormal results of thyroid function studies: Secondary | ICD-10-CM

## 2017-07-24 LAB — TSH: TSH: 6.47 u[IU]/mL — AB (ref 0.35–4.50)

## 2017-08-19 DIAGNOSIS — C61 Malignant neoplasm of prostate: Secondary | ICD-10-CM | POA: Diagnosis not present

## 2017-08-19 DIAGNOSIS — R310 Gross hematuria: Secondary | ICD-10-CM | POA: Diagnosis not present

## 2017-08-28 ENCOUNTER — Telehealth: Payer: Self-pay | Admitting: *Deleted

## 2017-08-28 NOTE — Telephone Encounter (Signed)
Copied from [place] [id number]. Topic: Quick Communication - Lab Results >> Aug 28, 2017  9:33 AM Scherrie Gerlach wrote: Pt states no one ever called him about his lab done  07/24/17. Would like to know what it was.

## 2017-08-31 NOTE — Telephone Encounter (Signed)
Please advise 

## 2017-08-31 NOTE — Telephone Encounter (Signed)
Notify patient that thyroid test was improved but still mildly abnormal. We will repeat at the time of his annual exam but ask  patient to call the office if he develops weight gain weakness or starts feeling sluggish

## 2017-09-01 NOTE — Telephone Encounter (Signed)
Spoke with pt and informed him that his thyroid test was improved but still mildly abnormal. We will repeat at the time of his annual exam but asked patient to call the office if he develops weight gain weakness or starts feeling sluggish. Pt verbalized understanding.

## 2017-09-08 DIAGNOSIS — R310 Gross hematuria: Secondary | ICD-10-CM | POA: Diagnosis not present

## 2017-09-09 DIAGNOSIS — R310 Gross hematuria: Secondary | ICD-10-CM | POA: Diagnosis not present

## 2017-09-09 DIAGNOSIS — C61 Malignant neoplasm of prostate: Secondary | ICD-10-CM | POA: Diagnosis not present

## 2017-09-10 DIAGNOSIS — F251 Schizoaffective disorder, depressive type: Secondary | ICD-10-CM | POA: Diagnosis not present

## 2018-01-05 DIAGNOSIS — F251 Schizoaffective disorder, depressive type: Secondary | ICD-10-CM | POA: Diagnosis not present

## 2018-03-30 DIAGNOSIS — H52203 Unspecified astigmatism, bilateral: Secondary | ICD-10-CM | POA: Diagnosis not present

## 2018-03-30 DIAGNOSIS — H2513 Age-related nuclear cataract, bilateral: Secondary | ICD-10-CM | POA: Diagnosis not present

## 2018-03-30 DIAGNOSIS — H5213 Myopia, bilateral: Secondary | ICD-10-CM | POA: Diagnosis not present

## 2018-04-26 DIAGNOSIS — H401411 Capsular glaucoma with pseudoexfoliation of lens, right eye, mild stage: Secondary | ICD-10-CM | POA: Diagnosis not present

## 2018-05-05 DIAGNOSIS — F251 Schizoaffective disorder, depressive type: Secondary | ICD-10-CM | POA: Diagnosis not present

## 2018-05-25 ENCOUNTER — Ambulatory Visit (INDEPENDENT_AMBULATORY_CARE_PROVIDER_SITE_OTHER): Payer: Medicare Other | Admitting: Internal Medicine

## 2018-05-25 ENCOUNTER — Encounter: Payer: Self-pay | Admitting: Internal Medicine

## 2018-05-25 VITALS — BP 102/62 | HR 70 | Temp 98.3°F | Ht 70.0 in | Wt 233.2 lb

## 2018-05-25 DIAGNOSIS — R635 Abnormal weight gain: Secondary | ICD-10-CM | POA: Diagnosis not present

## 2018-05-25 DIAGNOSIS — J3089 Other allergic rhinitis: Secondary | ICD-10-CM

## 2018-05-25 DIAGNOSIS — E785 Hyperlipidemia, unspecified: Secondary | ICD-10-CM

## 2018-05-25 DIAGNOSIS — C61 Malignant neoplasm of prostate: Secondary | ICD-10-CM | POA: Diagnosis not present

## 2018-05-25 LAB — CBC WITH DIFFERENTIAL/PLATELET
BASOS PCT: 1.2 % (ref 0.0–3.0)
Basophils Absolute: 0 10*3/uL (ref 0.0–0.1)
EOS ABS: 0.1 10*3/uL (ref 0.0–0.7)
EOS PCT: 2.3 % (ref 0.0–5.0)
HCT: 40.3 % (ref 39.0–52.0)
Hemoglobin: 13.8 g/dL (ref 13.0–17.0)
LYMPHS ABS: 0.8 10*3/uL (ref 0.7–4.0)
Lymphocytes Relative: 24.5 % (ref 12.0–46.0)
MCHC: 34.3 g/dL (ref 30.0–36.0)
MCV: 99.2 fl (ref 78.0–100.0)
MONO ABS: 0.3 10*3/uL (ref 0.1–1.0)
Monocytes Relative: 10.8 % (ref 3.0–12.0)
NEUTROS ABS: 1.9 10*3/uL (ref 1.4–7.7)
Neutrophils Relative %: 61.2 % (ref 43.0–77.0)
PLATELETS: 168 10*3/uL (ref 150.0–400.0)
RBC: 4.06 Mil/uL — ABNORMAL LOW (ref 4.22–5.81)
RDW: 13.2 % (ref 11.5–15.5)
WBC: 3.1 10*3/uL — ABNORMAL LOW (ref 4.0–10.5)

## 2018-05-25 LAB — COMPREHENSIVE METABOLIC PANEL
ALK PHOS: 85 U/L (ref 39–117)
ALT: 32 U/L (ref 0–53)
AST: 23 U/L (ref 0–37)
Albumin: 4.4 g/dL (ref 3.5–5.2)
BILIRUBIN TOTAL: 0.6 mg/dL (ref 0.2–1.2)
BUN: 20 mg/dL (ref 6–23)
CO2: 31 meq/L (ref 19–32)
Calcium: 9.5 mg/dL (ref 8.4–10.5)
Chloride: 103 mEq/L (ref 96–112)
Creatinine, Ser: 1.15 mg/dL (ref 0.40–1.50)
GFR: 64.48 mL/min (ref >60.00)
GLUCOSE: 100 mg/dL — AB (ref 70–99)
POTASSIUM: 4.6 meq/L (ref 3.5–5.1)
SODIUM: 140 meq/L (ref 135–145)
TOTAL PROTEIN: 6.7 g/dL (ref 6.0–8.3)

## 2018-05-25 LAB — LIPID PANEL
Cholesterol: 143 mg/dL (ref 0–200)
HDL: 47.2 mg/dL (ref >39.00)
LDL Cholesterol: 76 mg/dL (ref 0–99)
NonHDL: 96.24
Total CHOL/HDL Ratio: 3
Triglycerides: 99 mg/dL (ref 0.0–149.0)
VLDL: 19.8 mg/dL (ref 0.0–40.0)

## 2018-05-25 LAB — TSH: TSH: 5.86 u[IU]/mL — ABNORMAL HIGH (ref 0.35–4.50)

## 2018-05-25 NOTE — Patient Instructions (Addendum)
You need to lose weight.  Consider a lower calorie diet and regular exercise.    It is important that you exercise regularly, at least 20 minutes 3 to 4 times per week.  If you develop chest pain or shortness of breath seek  medical attention.  Return in 6 months for follow-up   Health Maintenance, Male A healthy lifestyle and preventive care is important for your health and wellness. Ask your health care provider about what schedule of regular examinations is right for you. What should I know about weight and diet? Eat a Healthy Diet  Eat plenty of vegetables, fruits, whole grains, low-fat dairy products, and lean protein.  Do not eat a lot of foods high in solid fats, added sugars, or salt.  Maintain a Healthy Weight Regular exercise can help you achieve or maintain a healthy weight. You should:  Do at least 150 minutes of exercise each week. The exercise should increase your heart rate and make you sweat (moderate-intensity exercise).  Do strength-training exercises at least twice a week.  Watch Your Levels of Cholesterol and Blood Lipids  Have your blood tested for lipids and cholesterol every 5 years starting at 82 years of age. If you are at high risk for heart disease, you should start having your blood tested when you are 82 years old. You may need to have your cholesterol levels checked more often if: ? Your lipid or cholesterol levels are high. ? You are older than 82 years of age. ? You are at high risk for heart disease.  What should I know about cancer screening? Many types of cancers can be detected early and may often be prevented. Lung Cancer  You should be screened every year for lung cancer if: ? You are a current smoker who has smoked for at least 30 years. ? You are a former smoker who has quit within the past 15 years.  Talk to your health care provider about your screening options, when you should start screening, and how often you should be  screened.  Colorectal Cancer  Routine colorectal cancer screening usually begins at 82 years of age and should be repeated every 5-10 years until you are 81 years old. You may need to be screened more often if early forms of precancerous polyps or small growths are found. Your health care provider may recommend screening at an earlier age if you have risk factors for colon cancer.  Your health care provider may recommend using home test kits to check for hidden blood in the stool.  A small camera at the end of a tube can be used to examine your colon (sigmoidoscopy or colonoscopy). This checks for the earliest forms of colorectal cancer.  Prostate and Testicular Cancer  Depending on your age and overall health, your health care provider may do certain tests to screen for prostate and testicular cancer.  Talk to your health care provider about any symptoms or concerns you have about testicular or prostate cancer.  Skin Cancer  Check your skin from head to toe regularly.  Tell your health care provider about any new moles or changes in moles, especially if: ? There is a change in a mole's size, shape, or color. ? You have a mole that is larger than a pencil eraser.  Always use sunscreen. Apply sunscreen liberally and repeat throughout the day.  Protect yourself by wearing long sleeves, pants, a wide-brimmed hat, and sunglasses when outside.  What should I know about heart  disease, diabetes, and high blood pressure?  If you are 62-70 years of age, have your blood pressure checked every 3-5 years. If you are 91 years of age or older, have your blood pressure checked every year. You should have your blood pressure measured twice-once when you are at a hospital or clinic, and once when you are not at a hospital or clinic. Record the average of the two measurements. To check your blood pressure when you are not at a hospital or clinic, you can use: ? An automated blood pressure machine at a  pharmacy. ? A home blood pressure monitor.  Talk to your health care provider about your target blood pressure.  If you are between 22-68 years old, ask your health care provider if you should take aspirin to prevent heart disease.  Have regular diabetes screenings by checking your fasting blood sugar level. ? If you are at a normal weight and have a low risk for diabetes, have this test once every three years after the age of 77. ? If you are overweight and have a high risk for diabetes, consider being tested at a younger age or more often.  A one-time screening for abdominal aortic aneurysm (AAA) by ultrasound is recommended for men aged 8-75 years who are current or former smokers. What should I know about preventing infection? Hepatitis B If you have a higher risk for hepatitis B, you should be screened for this virus. Talk with your health care provider to find out if you are at risk for hepatitis B infection. Hepatitis C Blood testing is recommended for:  Everyone born from [birth year] through 1963.  Anyone with known risk factors for hepatitis C.  Sexually Transmitted Diseases (STDs)  You should be screened each year for STDs including gonorrhea and chlamydia if: ? You are sexually active and are younger than 82 years of age. ? You are older than 82 years of age and your health care provider tells you that you are at risk for this type of infection. ? Your sexual activity has changed since you were last screened and you are at an increased risk for chlamydia or gonorrhea. Ask your health care provider if you are at risk.  Talk with your health care provider about whether you are at high risk of being infected with HIV. Your health care provider may recommend a prescription medicine to help prevent HIV infection.  What else can I do?  Schedule regular health, dental, and eye exams.  Stay current with your vaccines (immunizations).  Do not use any tobacco products, such as  cigarettes, chewing tobacco, and e-cigarettes. If you need help quitting, ask your health care provider.  Limit alcohol intake to no more than 2 drinks per day. One drink equals 12 ounces of beer, 5 ounces of wine, or 1 ounces of hard liquor.  Do not use street drugs.  Do not share needles.  Ask your health care provider for help if you need support or information about quitting drugs.  Tell your health care provider if you often feel depressed.  Tell your health care provider if you have ever been abused or do not feel safe at home. This information is not intended to replace advice given to you by your health care provider. Make sure you discuss any questions you have with your health care provider. Document Released: 03/27/2008 Document Revised: 05/28/2016 Document Reviewed: 07/03/2015 Elsevier Interactive Patient Education  [employer].

## 2018-05-25 NOTE — Progress Notes (Signed)
Subjective:    Patient ID: Gary Osborn, male    DOB: 22-Jul-1933, 84 y.o.   MRN: [id number]  HPI  84 year old patient who is seen today for annual follow-up and a subsequent Medicare wellness visit He has a history of major depression in continues to be followed by psychiatry. He has remote history of prostate cancer status post resection and was seen by urology about 1 year ago  Wt Readings from Last 3 Encounters:  05/25/18 233 lb 3.2 oz (105.8 kg)  05/22/17 227 lb 6.4 oz (103.1 kg)  05/21/16 (P) 208 lb (94.3 kg)   Social history.  He and his [facility]  He states that he eats outside facility 3 times per week and has 3-4 beers at that time  Past Medical History:  Diagnosis Date  . ALLERGIC RHINITIS 10/05/2007  . ANXIETY 10/05/2007  . COLONIC POLYPS, HX OF 10/05/2007  . DEPRESSION 10/04/2008  . PROSTATE CANCER, UNSPEC. 10/05/2007  . 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 Marital status: Married    Spouse name: Not on file  . Number of children: 2  . Years of education: Not on file  . Highest education level: Not on file  Occupational History  . Occupation: Retired    Fish farm manager: RETIRED  Social Needs  . Financial resource strain: Not on file  . Food insecurity:    Worry: Not on file    Inability: Not on file  . Transportation needs:    Medical: Not on file    Non-medical: Not on file  Tobacco Use  . Smoking status: Former Smoker    Types: Cigarettes    Last attempt to quit: 10/13/1973    Years since quitting: 44.6  . Smokeless tobacco: Never Used  Substance and Sexual Activity  . Alcohol use: Yes    Alcohol/week: 28.0 standard drinks    Types: 14 Standard drinks or equivalent, 14 Cans of beer per week    Comment: glass of chardonnay each night  . Drug use: No  . Sexual activity: Not on file  Lifestyle  . Physical activity:    Days per week: Not on file    Minutes per session: Not on file    . Stress: Not on file  Relationships  . Social connections:    Talks on phone: Not on file    Gets together: Not on file    Attends religious service: Not on file    Active member of club or organization: Not on file    Attends meetings of clubs or organizations: Not on file    Relationship status: Not on file  . Intimate partner violence:    Fear of current or ex partner: Not on file    Emotionally abused: Not on file    Physically abused: Not on file    Forced sexual activity: Not on file  Other Topics Concern  . Not on file  Social History Narrative  . Not on file    Past Surgical History:  Procedure Laterality Date  . NASAL SINUS SURGERY    . PROSTATE SURGERY    . singulotomy     for OCD  . TONSILLECTOMY      Family History  Problem Relation Age of Onset  . Prostate cancer Father   . Breast cancer Sister     No Known Allergies  Current Outpatient Medications on File Prior to Visit  Medication  Sig Dispense Refill  . busPIRone (BUSPAR) 10 MG tablet Take 10 mg by mouth 2 (two) times daily. Take 2 tabs twice daily    . clonazePAM (KLONOPIN) 0.5 MG tablet Take 1 tablet (0.5 mg total) by mouth 2 (two) times daily as needed. For severe anxiety 8 tablet 0  . docusate sodium (COLACE) 100 MG capsule Take 100 mg by mouth daily.    . mirtazapine (REMERON) 45 MG tablet Take 0.5 tablets (22.5 mg total) by mouth at bedtime. For depression 30 tablet 0  . PARoxetine (PAXIL) 40 MG tablet Take 1 tablet (40 mg total) by mouth at bedtime. For depression 30 tablet 0  . polyethylene glycol (MIRALAX / GLYCOLAX) packet Take 17 g by mouth.     No current facility-administered medications on file prior to visit.     BP 102/62 (BP Location: Right Arm, Patient Position: Sitting, Cuff Size: Large)   Pulse 70   Temp 98.3 F (36.8 C) (Oral)   Ht 5\' 10"  (1.778 m)   Wt 233 lb 3.2 oz (105.8 kg)   SpO2 95%   BMI 33.46 kg/m   Subsequent Medicare wellness visit  1. Risk factors, based on  past  M,S,F history.  No major cardiovascular risk factors other than age and sex  2.  Physical activities: Resident of [facility].  He does walk several times per week.  His apartment is about a quarter of a mile from the dining room  3.  Depression/mood: History major depression as well as suicide attempts.  Followed by psychiatry  4.  Hearing: No deficits  5.  ADL's: Independent  6.  Fall risk: Increased due to age and obesity  7.  Home safety: No issues identified 8.  Height weight, and visual acuity; height and weight stable is followed closely by ophthalmology.  Glaucoma suspect  9.  Counseling: Weight loss encouraged.  Moderation of alcohol use recommended  10. Lab orders based on risk factors: Laboratory update will be reviewed  11. Referral :  Follow-u [facility]mology[facility] 12. Care plan:  Continue effort at weight loss and aggressive risk factor modifications 13. Cognitive assessment: Alert and appropriate with slightly flat affect  14. Screening: Patient provided with a written and personalized 5-10 year screening schedule in the AVS.    15. Provider List Update: Psychiatry primary care ophthalmology and urology    Review of Systems  Constitutional: Positive for unexpected weight change. Negative for appetite change, chills, fatigue and fever.  HENT: Negative for congestion, dental problem, ear pain, hearing loss, sore throat, tinnitus, trouble swallowing and voice change.   Eyes: Negative for pain, discharge and visual disturbance.  Respiratory: Negative for cough, chest tightness, wheezing and stridor.   Cardiovascular: Negative for chest pain, palpitations and leg swelling.  Gastrointestinal: Negative for abdominal distention, abdominal pain, blood in stool, constipation, diarrhea, nausea and vomiting.  Genitourinary: Negative for difficulty urinating, discharge, flank pain, genital sores, hematuria and urgency.  Musculoskeletal: Negative for arthralgias,  back pain, gait problem, joint swelling, myalgias and neck stiffness.  Skin: Negative for rash.  Neurological: Negative for dizziness, syncope, speech difficulty, weakness, numbness and headaches.  Hematological: Negative for adenopathy. Does not bruise/bleed easily.  Psychiatric/Behavioral: Negative for behavioral problems and dysphoric mood. The patient is not nervous/anxious.        Objective:   Physical Exam  Constitutional: He appears well-developed and well-nourished.  HENT:  Head: Normocephalic and atraumatic.  Right Ear: External ear normal.  Left Ear: External ear  normal.  Nose: Nose normal.  Mouth/Throat: Oropharynx is clear and moist.  Eyes: Pupils are equal, round, and reactive to light. Conjunctivae and EOM are normal. No scleral icterus.  Neck: Normal range of motion. Neck supple. No JVD present. No thyromegaly present.  Cardiovascular: Regular rhythm, normal heart sounds and intact distal pulses. Exam reveals no gallop and no friction rub.  No murmur heard. Pedal pulses full  Pulmonary/Chest: Effort normal and breath sounds normal. He exhibits no tenderness.  Abdominal: Soft. Bowel sounds are normal. He exhibits no distension and no mass. There is no tenderness.  Obese  Genitourinary: Penis normal. Rectal exam shows guaiac negative stool.  Genitourinary Comments: Suggestion of some fullness involving the right lateral rectal wall  Musculoskeletal: Normal range of motion. He exhibits no edema or tenderness.  Lymphadenopathy:    He has no cervical adenopathy.  Neurological: He is alert. He has normal reflexes. No cranial nerve deficit. Coordination normal.  Skin: Skin is warm and dry. No rash noted.  Psychiatric: He has a normal mood and affect. His behavior is normal.          Assessment & Plan:   Exogenous obesity History major depression stable.  Follow-up psychiatry Anxiety disorder  Will check updated lab Weight loss encouraged  Medications  refilled  Marletta Lor

## 2018-06-24 DIAGNOSIS — H40023 Open angle with borderline findings, high risk, bilateral: Secondary | ICD-10-CM | POA: Diagnosis not present

## 2018-06-29 ENCOUNTER — Encounter: Payer: Medicare Other | Admitting: Family Medicine

## 2018-06-30 ENCOUNTER — Encounter: Payer: Self-pay | Admitting: Family Medicine

## 2018-06-30 ENCOUNTER — Ambulatory Visit (INDEPENDENT_AMBULATORY_CARE_PROVIDER_SITE_OTHER): Payer: Medicare Other | Admitting: Family Medicine

## 2018-06-30 VITALS — BP 108/72 | HR 82 | Temp 99.3°F | Ht 70.0 in | Wt 234.0 lb

## 2018-06-30 DIAGNOSIS — F419 Anxiety disorder, unspecified: Secondary | ICD-10-CM | POA: Diagnosis not present

## 2018-06-30 DIAGNOSIS — F329 Major depressive disorder, single episode, unspecified: Secondary | ICD-10-CM

## 2018-06-30 DIAGNOSIS — Z23 Encounter for immunization: Secondary | ICD-10-CM | POA: Diagnosis not present

## 2018-06-30 NOTE — Progress Notes (Signed)
Subjective:    Patient ID: JERMAIN CURT, male    DOB: 04-22-1933, 84 y.o.   MRN: [id number]  No chief complaint on file.   HPI Patient was seen today for f/u and TOC, previously seen by Dr. Burnice Logan.  Pt states overall he is doing well.  He is living at [facility] with his wife.  Pt states he has dealt with depression in the past but has been feeling good.  Sleep and energy are also good. He is taking buspar, clonazepam 0.5 mg, Remeron, Paxil 40 mg daily.  Pt notes history of prostate cancer status post removal.  Pt followed by urology.  Pt notes blood in his depends.  Was seen by urology and notes no abnormal findings.  He has a follow-up scheduled next week with urology.  He has not noted any blood since.  Denies difficulty urinating, changes in weight.  Past Medical History:  Diagnosis Date  . ALLERGIC RHINITIS 10/05/2007  . ANXIETY 10/05/2007  . COLONIC POLYPS, HX OF 10/05/2007  . DEPRESSION 10/04/2008  . PROSTATE CANCER, UNSPEC. 10/05/2007  . SINUS BRADYCARDIA 10/04/2008    Allergies  Allergen Reactions  . Cats Claw [Uncaria Tomentosa (Cats Claw)]     PT IS ALLERGIC TO CATS/ DOES NOT HAVE ANY    ROS General: Denies fever, chills, night sweats, changes in weight, changes in appetite HEENT: Denies headaches, ear pain, changes in vision, rhinorrhea, sore throat CV: Denies CP, palpitations, SOB, orthopnea Pulm: Denies SOB, cough, wheezing GI: Denies abdominal pain, nausea, vomiting, diarrhea, constipation GU: Denies dysuria, hematuria, frequency, vaginal discharge Msk: Denies muscle cramps, joint pains Neuro: Denies weakness, numbness, tingling Skin: Denies rashes, bruising Psych: Denies depression, anxiety, hallucinations  +h/o depression and anxiety     Objective:    Blood pressure 108/72, pulse 82, temperature 99.3 F (37.4 C), temperature source Oral, height 5\' 10"  (1.778 m), weight 234 lb (106.1 kg), SpO2 96 %.   Gen. Pleasant,  well-nourished, in no distress, normal affect  HEENT: [place]/AT, face symmetric, no scleral icterus, PERRLA, nares patent without drainage, pharynx without erythema or exudate.  TMs normal. Lungs: no accessory muscle use, CTAB, no wheezes or rales Cardiovascular: RRR, no m/r/g, no peripheral edema Neuro:  A&Ox3, CN II-XII intact, normal gait, fidgeting/pill rolling of hands b/l during visit Skin:  Warm, no lesions/ rash   Wt Readings from Last 3 Encounters:  06/30/18 234 lb (106.1 kg)  05/25/18 233 lb 3.2 oz (105.8 kg)  05/22/17 227 lb 6.4 oz (103.1 kg)    Lab Results  Component Value Date   WBC 3.1 (L) 05/25/2018   HGB 13.8 05/25/2018   HCT 40.3 05/25/2018   PLT 168.0 05/25/2018   GLUCOSE 100 (H) 05/25/2018   CHOL 143 05/25/2018   TRIG 99.0 05/25/2018   HDL 47.20 05/25/2018   LDLCALC 76 05/25/2018   ALT 32 05/25/2018   AST 23 05/25/2018   NA 140 05/25/2018   K 4.6 05/25/2018   CL 103 05/25/2018   CREATININE 1.15 05/25/2018   BUN 20 05/25/2018   CO2 31 05/25/2018   TSH 5.86 (H) 05/25/2018   PSA 0.00 Repeated and verified X2. (L) 05/22/2017   HGBA1C 5.2 09/16/2014    Assessment/Plan:  Anxiety and depression -Stable -Continue BuSpar, clonazepam, Remeron, Paxil -Continue follow-up with psychiatry  Need for influenza vaccination  - Plan: Flu vaccine HIGH DOSE PF  Patient encouraged to keep follow-up appointment with urology for hematuria.  Follow-up PRN.  Next physical 05/2019  Kyrel Leighton, MD 

## 2018-07-06 DIAGNOSIS — H6983 Other specified disorders of Eustachian tube, bilateral: Secondary | ICD-10-CM | POA: Diagnosis not present

## 2018-07-14 ENCOUNTER — Encounter: Payer: Self-pay | Admitting: Family Medicine

## 2018-07-14 ENCOUNTER — Ambulatory Visit (INDEPENDENT_AMBULATORY_CARE_PROVIDER_SITE_OTHER): Payer: Medicare Other

## 2018-07-14 ENCOUNTER — Ambulatory Visit (INDEPENDENT_AMBULATORY_CARE_PROVIDER_SITE_OTHER): Payer: Medicare Other | Admitting: Family Medicine

## 2018-07-14 VITALS — BP 110/72 | HR 84 | Temp 98.6°F | Wt 235.0 lb

## 2018-07-14 DIAGNOSIS — M25561 Pain in right knee: Secondary | ICD-10-CM

## 2018-07-14 NOTE — Patient Instructions (Signed)

## 2018-07-14 NOTE — Progress Notes (Signed)
Subjective:    Gary Osborn is a 84 y.o. male who presents with knee pain involving the right knee. Onset was sudden, not related to any specific activity. Inciting event: getting dressed. Current symptoms include: pain/soreness with walking, slow ambulating with a limp, slow to start walking. Pain is aggravated by standing and walking. Pain seems to be gradually improving.  Pt has had no prior knee problems. Evaluation to date: none. Treatment to date: none.  The following portions of the patient's history were reviewed and updated as appropriate: allergies, current medications, past family history, past medical history, past social history, past surgical history and problem list.   Review of Systems A comprehensive review of systems was negative except as stated above.   Objective:    BP 110/72 (BP Location: Left Arm, Patient Position: Sitting, Cuff Size: Large)   Pulse 84   Temp 98.6 F (37 C) (Oral)   Wt 235 lb (106.6 kg)   SpO2 96%   BMI 33.72 kg/m  Right knee: Very mild effusion, full active range of motion, no joint line tenderness, ligamentous structures intact-negative anterior drawer, [place].  Left knee:  normal and no effusion, full active range of motion, no joint line tenderness, ligamentous structures intact.   X-ray right knee: ordered, but results not yet available    Assessment:    Right knee pain, likely sprain.  Plan:    Educational materials distributed. Rest, ice, compression, and elevation (RICE) therapy. NSAIDs prn. Plain film x-rays.   Will call pt with results.  If needed, for worsening of continued pain will refer to Ortho.  Grier Mitts, MD

## 2018-08-09 DIAGNOSIS — C61 Malignant neoplasm of prostate: Secondary | ICD-10-CM | POA: Diagnosis not present

## 2018-08-16 DIAGNOSIS — N393 Stress incontinence (female) (male): Secondary | ICD-10-CM | POA: Diagnosis not present

## 2018-08-16 DIAGNOSIS — Z8546 Personal history of malignant neoplasm of prostate: Secondary | ICD-10-CM | POA: Diagnosis not present

## 2018-09-02 DIAGNOSIS — F251 Schizoaffective disorder, depressive type: Secondary | ICD-10-CM | POA: Diagnosis not present

## 2018-09-15 DIAGNOSIS — L814 Other melanin hyperpigmentation: Secondary | ICD-10-CM | POA: Diagnosis not present

## 2018-09-15 DIAGNOSIS — L219 Seborrheic dermatitis, unspecified: Secondary | ICD-10-CM | POA: Diagnosis not present

## 2018-09-15 DIAGNOSIS — L821 Other seborrheic keratosis: Secondary | ICD-10-CM | POA: Diagnosis not present

## 2018-09-15 DIAGNOSIS — L859 Epidermal thickening, unspecified: Secondary | ICD-10-CM | POA: Diagnosis not present

## 2018-09-15 DIAGNOSIS — D1801 Hemangioma of skin and subcutaneous tissue: Secondary | ICD-10-CM | POA: Diagnosis not present

## 2019-01-03 DIAGNOSIS — L219 Seborrheic dermatitis, unspecified: Secondary | ICD-10-CM | POA: Diagnosis not present

## 2019-01-03 DIAGNOSIS — L821 Other seborrheic keratosis: Secondary | ICD-10-CM | POA: Diagnosis not present

## 2019-01-03 DIAGNOSIS — L918 Other hypertrophic disorders of the skin: Secondary | ICD-10-CM | POA: Diagnosis not present

## 2019-01-03 DIAGNOSIS — D1801 Hemangioma of skin and subcutaneous tissue: Secondary | ICD-10-CM | POA: Diagnosis not present

## 2019-04-26 DIAGNOSIS — L859 Epidermal thickening, unspecified: Secondary | ICD-10-CM | POA: Diagnosis not present

## 2019-04-26 DIAGNOSIS — D225 Melanocytic nevi of trunk: Secondary | ICD-10-CM | POA: Diagnosis not present

## 2019-04-26 DIAGNOSIS — L821 Other seborrheic keratosis: Secondary | ICD-10-CM | POA: Diagnosis not present

## 2019-04-26 DIAGNOSIS — L570 Actinic keratosis: Secondary | ICD-10-CM | POA: Diagnosis not present

## 2019-05-30 ENCOUNTER — Encounter: Payer: Medicare Other | Admitting: Family Medicine

## 2019-06-01 ENCOUNTER — Other Ambulatory Visit: Payer: Self-pay

## 2019-06-01 ENCOUNTER — Ambulatory Visit (INDEPENDENT_AMBULATORY_CARE_PROVIDER_SITE_OTHER): Payer: Medicare Other | Admitting: Family Medicine

## 2019-06-01 ENCOUNTER — Encounter: Payer: Self-pay | Admitting: Family Medicine

## 2019-06-01 VITALS — BP 110/64 | HR 72 | Temp 97.1°F | Wt 212.0 lb

## 2019-06-01 DIAGNOSIS — Z0000 Encounter for general adult medical examination without abnormal findings: Secondary | ICD-10-CM

## 2019-06-01 DIAGNOSIS — F419 Anxiety disorder, unspecified: Secondary | ICD-10-CM | POA: Diagnosis not present

## 2019-06-01 DIAGNOSIS — E785 Hyperlipidemia, unspecified: Secondary | ICD-10-CM

## 2019-06-01 DIAGNOSIS — F329 Major depressive disorder, single episode, unspecified: Secondary | ICD-10-CM | POA: Diagnosis not present

## 2019-06-01 DIAGNOSIS — F32A Depression, unspecified: Secondary | ICD-10-CM

## 2019-06-01 DIAGNOSIS — Z87898 Personal history of other specified conditions: Secondary | ICD-10-CM

## 2019-06-01 DIAGNOSIS — C61 Malignant neoplasm of prostate: Secondary | ICD-10-CM | POA: Diagnosis not present

## 2019-06-01 LAB — PSA: PSA: 0 ng/mL — ABNORMAL LOW (ref 0.10–4.00)

## 2019-06-01 LAB — CBC WITH DIFFERENTIAL/PLATELET
Basophils Absolute: 0 10*3/uL (ref 0.0–0.1)
Basophils Relative: 1.2 % (ref 0.0–3.0)
Eosinophils Absolute: 0.1 10*3/uL (ref 0.0–0.7)
Eosinophils Relative: 2.8 % (ref 0.0–5.0)
HCT: 40.6 % (ref 39.0–52.0)
Hemoglobin: 13.6 g/dL (ref 13.0–17.0)
Lymphocytes Relative: 25.1 % (ref 12.0–46.0)
Lymphs Abs: 0.9 10*3/uL (ref 0.7–4.0)
MCHC: 33.6 g/dL (ref 30.0–36.0)
MCV: 98.2 fl (ref 78.0–100.0)
Monocytes Absolute: 0.4 10*3/uL (ref 0.1–1.0)
Monocytes Relative: 10.7 % (ref 3.0–12.0)
Neutro Abs: 2.1 10*3/uL (ref 1.4–7.7)
Neutrophils Relative %: 60.2 % (ref 43.0–77.0)
Platelets: 165 10*3/uL (ref 150.0–400.0)
RBC: 4.14 Mil/uL — ABNORMAL LOW (ref 4.22–5.81)
RDW: 13.5 % (ref 11.5–15.5)
WBC: 3.4 10*3/uL — ABNORMAL LOW (ref 4.0–10.5)

## 2019-06-01 LAB — COMPREHENSIVE METABOLIC PANEL
ALT: 29 U/L (ref 0–53)
AST: 20 U/L (ref 0–37)
Albumin: 4.5 g/dL (ref 3.5–5.2)
Alkaline Phosphatase: 86 U/L (ref 39–117)
BUN: 15 mg/dL (ref 6–23)
CO2: 29 mEq/L (ref 19–32)
Calcium: 9.2 mg/dL (ref 8.4–10.5)
Chloride: 104 mEq/L (ref 96–112)
Creatinine, Ser: 1.23 mg/dL (ref 0.40–1.50)
GFR: 56 mL/min — ABNORMAL LOW (ref >60.00)
Glucose, Bld: 95 mg/dL (ref 70–99)
Potassium: 4.7 mEq/L (ref 3.5–5.1)
Sodium: 140 mEq/L (ref 135–145)
Total Bilirubin: 0.6 mg/dL (ref 0.2–1.2)
Total Protein: 6.7 g/dL (ref 6.0–8.3)

## 2019-06-01 LAB — LIPID PANEL
Cholesterol: 152 mg/dL (ref 0–200)
HDL: 51.5 mg/dL (ref >39.00)
LDL Cholesterol: 84 mg/dL (ref 0–99)
NonHDL: 100.1
Total CHOL/HDL Ratio: 3
Triglycerides: 79 mg/dL (ref 0.0–149.0)
VLDL: 15.8 mg/dL (ref 0.0–40.0)

## 2019-06-01 LAB — T4, FREE: Free T4: 0.62 ng/dL (ref 0.60–1.60)

## 2019-06-01 LAB — TSH: TSH: 5.13 u[IU]/mL — ABNORMAL HIGH (ref 0.35–4.50)

## 2019-06-01 NOTE — Progress Notes (Signed)
Subjective:    Gary Osborn is a 85 y.o. male who presents for Medicare Annual/Subsequent preventive examination.  Pt states he has been doing well.  Has a h/o major depression and anxiety, followed by Psychiatry, stable on current meds.  Endorse good mood, sleep, and energy.  Has remote h/o prostate cancer s/p resection.  States decided to stop going to Urology twice a year as did not seem necessary.  Pt denies hematuria or urinary issues.  Preventive Screening-Counseling & Management  Tobacco Social History   Tobacco Use  Smoking Status Former Smoker  . Types: Cigarettes  . Quit date: 10/13/1973  . Years since quitting: 45.6  Smokeless Tobacco Never Used     Current Problems (verified) Patient Active Problem List   Diagnosis Date Noted  . Epistaxis 11/11/2016  . Severe recurrent major depression with psychotic features ([place])   . MDD (major depressive disorder), recurrent severe, without psychosis ([place]) 09/14/2014  . H/O suicide attempt 09/08/2014  . Unsuccessful suicide attempt ([place]) 09/08/2014  . Severe major depression without psychotic features ([place]) 09/08/2014  . Major depressive disorder, recurrent, severe without psychotic features ([place])   . Suicidal ideation   . MDD (major depressive disorder), recurrent, severe, with psychosis ([place]) 01/05/2014  . Heart palpitations 06/30/2012  . 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 SINUS BRADYCARDIA 10/04/2008  . PROSTATE CANCER, UNSPEC. 10/05/2007  . Anxiety state 10/05/2007  . Allergic rhinitis 10/05/2007  . COLONIC POLYPS, HX OF 10/05/2007    Medications Prior to Visit Current Outpatient Medications on File Prior to Visit  Medication Sig Dispense Refill  . busPIRone (BUSPAR) 10 MG tablet Take 10 mg by mouth 2 (two) times daily. Take 2 tabs twice daily    . clonazePAM (KLONOPIN) 0.5 MG tablet Take 1 tablet (0.5 mg total) by mouth 2 (two) times daily as needed. For severe anxiety 8 tablet 0  . docusate sodium (COLACE) 100 MG capsule  Take 100 mg by mouth daily.    . mirtazapine (REMERON) 45 MG tablet Take 0.5 tablets (22.5 mg total) by mouth at bedtime. For depression 30 tablet 0  . Multiple Vitamins-Minerals (CENTRUM SILVER PO) Take by mouth daily.    Marland Kitchen PARoxetine (PAXIL) 40 MG tablet Take 1 tablet (40 mg total) by mouth at bedtime. For depression 30 tablet 0   No current facility-administered medications on file prior to visit.     Current Medications (verified) Current Outpatient Medications  Medication Sig Dispense Refill  . busPIRone (BUSPAR) 10 MG tablet Take 10 mg by mouth 2 (two) times daily. Take 2 tabs twice daily    . clonazePAM (KLONOPIN) 0.5 MG tablet Take 1 tablet (0.5 mg total) by mouth 2 (two) times daily as needed. For severe anxiety 8 tablet 0  . docusate sodium (COLACE) 100 MG capsule Take 100 mg by mouth daily.    . mirtazapine (REMERON) 45 MG tablet Take 0.5 tablets (22.5 mg total) by mouth at bedtime. For depression 30 tablet 0  . Multiple Vitamins-Minerals (CENTRUM SILVER PO) Take by mouth daily.    Marland Kitchen PARoxetine (PAXIL) 40 MG tablet Take 1 tablet (40 mg total) by mouth at bedtime. For depression 30 tablet 0   No current facility-administered medications for this visit.      Allergies (verified) Cats claw [uncaria tomentosa (cats claw)]   PAST HISTORY  Family History Family History  Problem Relation Age of Onset  . Prostate cancer Father   . Breast cancer Sister     Social History Social  History   Tobacco Use  . Smoking status: Former Smoker    Types: Cigarettes    Quit date: 10/13/1973    Years since quitting: 45.6  . Smokeless tobacco: Never Used  Substance Use Topics  . Alcohol use: Yes    Alcohol/week: 28.0 standard drinks    Types: 14 Standard drinks or equivalent, 14 Cans of beer per week    Comment: glass of chardonnay each night    Are there smokers in your home (other than you)?  No  Risk Factors Current exercise habits: The patient does not participate in regular  exercise at present.  2/2 COVD pandemic Dietary issues discussed: balanced diet  Cardiac risk factors: advanced age (older than 60 for men, 75 for women), dyslipidemia, male gender and obesity (BMI >= 30 kg/m2).  Depression Screen (Note: if answer to either of the following is "Yes", a more complete depression screening is indicated)   Q1: Over the past two weeks, have you felt down, depressed or hopeless? No  Q2: Over the past two weeks, have you felt little interest or pleasure in doing things? No  Have you lost interest or pleasure in daily life? No  Do you often feel hopeless? No  Do you cry easily over simple problems? No  Activities of Daily Living In your present state of health, do you have any difficulty performing the following activities?:  Driving? No Managing money?  No Feeding yourself? No Getting from bed to chair? No Climbing a flight of stairs? No Preparing food and eating?: No Bathing or showering? No Getting dressed: No Getting to the toilet? No Using the toilet:No Moving around from place to place: No In the past year have you fallen or had a near fall?:No  Hearing Difficulties: No Do you often ask people to speak up or repeat themselves? No Do you experience ringing or noises in your ears? No Do you have difficulty understanding soft or whispered voices? No   Do you feel that you have a problem with memory? No  Do you often misplace items? No  Do you feel safe at home?  Yes  Cognitive Testing  Alert? Yes  Normal Appearance?Yes  Oriented to person? Yes  Place? Yes   Time? Yes  Recall of three objects?  Yes  Can perform simple calculations? Yes  Displays appropriate judgment?Yes  Can read the correct time from a watch face?Yes   List the Names of Other Physician/Practitioners you currently use: 1.  Norma Fredrickson, Psychiatry 2.  Urology 3.  Dermatology  Indicate any recent Medical Services you may have received from other than [facility] providers in  the past year (date may be approximate).  Immunization History  Administered Date(s) Administered  . Influenza Split 08/06/2011, 07/08/2012  . Influenza Whole 07/29/2007, 07/26/2008, 07/10/2009, 06/27/2010  . Influenza, High Dose Seasonal PF 07/06/2015, 06/30/2018  . Influenza,inj,Quad PF,6+ Mos 06/22/2013, 06/29/2014  . Influenza-Unspecified 07/04/2016, 07/22/2017  . Pneumococcal Conjugate-13 03/30/2015  . Pneumococcal Polysaccharide-23 07/14/2003  . Td 10/19/2009  . Zoster 12/13/2008    Screening Tests Health Maintenance  Topic Date Due  . INFLUENZA VACCINE  05/14/2019  . TETANUS/TDAP  10/20/2019  . PNA vac Low Risk Adult  Completed    All answers were reviewed with the patient and necessary referrals were made:  Billie Ruddy, MD   06/01/2019   History reviewed: allergies, current medications, past family history, past medical history, past social history, past surgical history and problem list  Review of Systems Pertinent  items noted in HPI and remainder of comprehensive ROS otherwise negative.    Objective:     Blood pressure 110/64, pulse 72, temperature (!) 97.1 F (36.2 C), temperature source Temporal, weight 212 lb (96.2 kg), SpO2 96 %. Body mass index is 30.42 kg/m.  BP 110/64 (BP Location: Left Arm, Patient Position: Sitting, Cuff Size: Normal)   Pulse 72   Temp (!) 97.1 F (36.2 C) (Temporal)   Wt 212 lb (96.2 kg)   SpO2 96%   BMI 30.42 kg/m  General appearance: alert and cooperative, in NAD Head: Normocephalic, without obvious abnormality, atraumatic Eyes: conjunctivae/corneas clear. PERRL, EOM's intact. Fundi benign. Ears: normal TM's and external ear canals both ears Nose: Nares normal. Septum midline. Mucosa normal. No drainage or sinus tenderness. Throat: lips, mucosa, and tongue normal; teeth and gums normal Neck: no adenopathy, no carotid bruit, no JVD, supple, symmetrical, trachea midline and thyroid not enlarged, symmetric, no  tenderness/mass/nodules Lungs: clear to auscultation bilaterally Chest wall: no tenderness Heart: regular rate and rhythm, S1, S2 normal, no murmur, click, rub or gallop Abdomen: soft, non-tender; bowel sounds normal; no masses,  no organomegaly Extremities: extremities normal, atraumatic, no cyanosis or edema Pulses: 2+ and symmetric Skin: warm, dry, intact.  Multiple actinic keratosis and skin tags noted on neck and chest.  A large seborrheic keratosis noted on left upper chest Lymph nodes: Cervical, supraclavicular, and axillary nodes normal. Neurologic: Alert and oriented X 3, normal strength and tone. Normal symmetric reflexes. Normal coordination and gait     Assessment:     Well 85 yo male     Plan:     During the course of the visit the patient was educated and counseled about appropriate screening and preventive services including:    Influenza vaccine  Prostate cancer screening  Nutrition counseling   Advanced directives: consider HCPOA, POA  Diet review for nutrition referral? Yes ____  Not Indicated _x___  Prostate cancer -Stable s/p resection -will obtain PSA this visit -Follow-up with urology PRN  Hyperlipidemia -Lipid panel  Anxiety and depression -Continue current meds -Continue follow-up with psychiatry  History of palpitations -Stable -We will obtain TSH, free T4, CMP, CBC   Patient Instructions (the written plan) was given to the patient.  Medicare Attestation I have personally reviewed: The patient's medical and social history Their use of alcohol, tobacco or illicit drugs Their current medications and supplements The patient's functional ability including ADLs,fall risks, home safety risks, cognitive, and hearing and visual impairment Diet and physical activities Evidence for depression or mood disorders  The patient's weight, height, BMI, and visual acuity have been recorded in the chart.  I have made referrals, counseling, and  provided education to the patient based on review of the above and I have provided the patient with a written personalized care plan for preventive services.     Billie Ruddy, MD   06/01/2019

## 2019-06-01 NOTE — Patient Instructions (Signed)
Health Maintenance After Age 83 After age 27, you are at a higher risk for certain long-term diseases and infections as well as injuries from falls. Falls are a major cause of broken bones and head injuries in people who are older than age 29. Getting regular preventive care can help to keep you healthy and well. Preventive care includes getting regular testing and making lifestyle changes as recommended by your health care provider. Talk with your health care provider about:  Which screenings and tests you should have. A screening is a test that checks for a disease when you have no symptoms.  A diet and exercise plan that is right for you. What should I know about screenings and tests to prevent falls? Screening and testing are the best ways to find a health problem early. Early diagnosis and treatment give you the best chance of managing medical conditions that are common after age 79. Certain conditions and lifestyle choices may make you more likely to have a fall. Your health care provider may recommend:  Regular vision checks. Poor vision and conditions such as cataracts can make you more likely to have a fall. If you wear glasses, make sure to get your prescription updated if your vision changes.  Medicine review. Work with your health care provider to regularly review all of the medicines you are taking, including over-the-counter medicines. Ask your health care provider about any side effects that may make you more likely to have a fall. Tell your health care provider if any medicines that you take make you feel dizzy or sleepy.  Osteoporosis screening. Osteoporosis is a condition that causes the bones to get weaker. This can make the bones weak and cause them to break more easily.  Blood pressure screening. Blood pressure changes and medicines to control blood pressure can make you feel dizzy.  Strength and balance checks. Your health care provider may recommend certain tests to check your  strength and balance while standing, walking, or changing positions.  Foot health exam. Foot pain and numbness, as well as not wearing proper footwear, can make you more likely to have a fall.  Depression screening. You may be more likely to have a fall if you have a fear of falling, feel emotionally low, or feel unable to do activities that you used to do.  Alcohol use screening. Using too much alcohol can affect your balance and may make you more likely to have a fall. What actions can I take to lower my risk of falls? General instructions  Talk with your health care provider about your risks for falling. Tell your health care provider if: ? You fall. Be sure to tell your health care provider about all falls, even ones that seem minor. ? You feel dizzy, sleepy, or off-balance.  Take over-the-counter and prescription medicines only as told by your health care provider. These include any supplements.  Eat a healthy diet and maintain a healthy weight. A healthy diet includes low-fat dairy products, low-fat (lean) meats, and fiber from whole grains, beans, and lots of fruits and vegetables. Home safety  Remove any tripping hazards, such as rugs, cords, and clutter.  Install safety equipment such as grab bars in bathrooms and safety rails on stairs.  Keep rooms and walkways well-lit. Activity   Follow a regular exercise program to stay fit. This will help you maintain your balance. Ask your health care provider what types of exercise are appropriate for you.  If you need a cane or  walker, use it as recommended by your health care provider.  Wear supportive shoes that have nonskid soles. Lifestyle  Do not drink alcohol if your health care provider tells you not to drink.  If you drink alcohol, limit how much you have: ? 0-1 drink a day for women. ? 0-2 drinks a day for men.  Be aware of how much alcohol is in your drink. In the [place] one drink equals one typical bottle of beer (12  oz), one-half glass of wine (5 oz), or one shot of hard liquor (1 oz).  Do not use any products that contain nicotine or tobacco, such as cigarettes and e-cigarettes. If you need help quitting, ask your health care provider. Summary  Having a healthy lifestyle and getting preventive care can help to protect your health and wellness after age 63.  Screening and testing are the best way to find a health problem early and help you avoid having a fall. Early diagnosis and treatment give you the best chance for managing medical conditions that are more common for people who are older than age 15.  Falls are a major cause of broken bones and head injuries in people who are older than age 20. Take precautions to prevent a fall at home.  Work with your health care provider to learn what changes you can make to improve your health and wellness and to prevent falls. This information is not intended to replace advice given to you by your health care provider. Make sure you discuss any questions you have with your health care provider. Document Released: 08/12/2017 Document Revised: 01/20/2019 Document Reviewed: 08/12/2017 Elsevier Patient Education  2020 [place] prostate is a walnut-sized gland that is involved in the production of semen. It is located below a man's bladder, in front of the rectum. Prostate cancer is the abnormal growth of cells in the prostate gland. What are the causes? The exact cause of this condition is not known. What increases the risk? This condition is more likely to develop in men who:  Are older than age 6.  Are African-American.  Are obese.  Have a family history of prostate cancer.  Have a family history of breast cancer. What are the signs or symptoms? Symptoms of this condition include:  A need to urinate often.  Weak or interrupted flow of urine.  Trouble starting or stopping urination.  Inability to urinate.  Pain or  burning during urination.  Painful ejaculation.  Blood in urine or semen.  Persistent pain or discomfort in the lower back, lower abdomen, hips, or upper thighs.  Trouble getting an erection.  Trouble emptying the bladder all the way. How is this diagnosed? This condition can be diagnosed with:  A digital rectal exam. For this exam, a health care provider inserts a gloved finger into the rectum to feel the prostate gland.  A blood test called a prostate-specific antigen (PSA) test.  An imaging test called transrectal ultrasonography.  A procedure in which a sample of tissue is taken from the prostate and examined under a microscope (prostate biopsy). Once the condition is diagnosed, tests will be done to determine how far the cancer has spread. This is called staging the cancer. Staging may involve imaging tests, such as:  A bone scan.  A CT scan.  A PET scan.  An MRI. The stages of prostate cancer are as follows:  Stage I. At this stage, the cancer is found in the prostate  only. The cancer is not visible on imaging tests and it is usually found by accident, such as during a prostate surgery.  Stage II. At this stage, the cancer is more advanced than it is in stage I, but the cancer has not spread outside the prostate.  Stage III. At this stage, the cancer has spread beyond the outer layer of the prostate to nearby tissues. The cancer may be found in the seminal vesicles, which are near the bladder and the prostate.  Stage IV. At this stage, the cancer has spread other parts of the body, such as the lymph nodes, bones, bladder, rectum, liver, or lungs. How is this treated? Treatment for this condition depends on several factors, including the stage of the cancer, your age, personal preferences, and your overall health. Talk with your health care provider about treatment options that are recommended for you. Common treatments include:  Observation for early stage prostate  cancer (active surveillance). This involves having exams, blood tests, and in some cases, more biopsies. For some men, this is the only treatment needed.  Surgery. Types of surgeries include: ? Open surgery. In this surgery, a larger incision is made to remove the prostate. ? A laparoscopic prostatectomy. This is a surgery to remove the prostate and lymph nodes through several, small incisions. It is often referred to as a minimally invasive surgery. ? A robotic prostatectomy. This is a surgery to remove the prostate and lymph nodes with the help of a robotic arm that is controlled by a computer. ? Orchiectomy. This is a surgery to remove the testicles. ? Cryosurgery. This is a surgery to freeze and destroy cancer cells.  Radiation treatment. Types of radiation treatment include: ? External beam radiation. This type aims beams of radiation from outside the body at the prostate to destroy cancerous cells. ? Brachytherapy. This type uses radioactive needles, seeds, wires, or tubes that are implanted into the prostate gland. Like external beam radiation, brachytherapy destroys cancerous cells. An advantage is that this type of radiation limits the damage to surrounding tissue and has fewer side effects.  High-intensity, focused ultrasonography. This treatment destroys cancer cells by delivering high-energy ultrasound waves to the cancerous cells.  Chemotherapy medicines. This treatment kills cancer cells or stops them from multiplying.  Hormone treatment. This treatment involves taking medicines that act on one of the male hormones (testosterone): ? By stopping your body from producing testosterone. ? By blocking testosterone from reaching cancer cells. Follow these instructions at home:  Take over-the-counter and prescription medicines only as told by your health care provider.  Maintain a healthy diet.  Get plenty of sleep.  Consider joining a support group for men who have prostate  cancer. Meeting with a support group may help you learn to cope with the stress of having cancer.  Keep all follow-up visits as told by your health care provider. This is important.  If you have to go to the hospital, notify your cancer specialist (oncologist).  Treatment for prostate cancer may affect sexual function. Continue to have intimate moments with your partner. This may include touching, holding, hugging, and caressing. Contact a health care provider if:  You have trouble urinating.  You have blood in your urine.  You have pain in your hips, back, or chest. Get help right away if:  You have weakness or numbness in your legs.  You cannot control urination or your bowel movements (incontinence).  You have trouble breathing.  You have sudden chest pain.  You have chills or a fever. Summary  The prostate is a walnut-sized gland that is involved in the production of semen. It is located below a man's bladder, in front of the rectum. Prostate cancer is the abnormal growth of cells in the prostate gland.  Treatment for this condition depends on several factors, including the stage of the cancer, your age, personal preferences, and your overall health. Talk with your health care provider about treatment options that are recommended for you.  Consider joining a support group for men who have prostate cancer. Meeting with a support group may help you learn to cope with the stress of having cancer. This information is not intended to replace advice given to you by your health care provider. Make sure you discuss any questions you have with your health care provider. Document Released: 09/29/2005 Document Revised: 09/11/2017 Document Reviewed: 06/09/2016 Elsevier Patient Education  2020 [employer].

## 2019-06-07 ENCOUNTER — Other Ambulatory Visit: Payer: Self-pay

## 2019-06-07 DIAGNOSIS — R7989 Other specified abnormal findings of blood chemistry: Secondary | ICD-10-CM

## 2019-06-17 ENCOUNTER — Telehealth: Payer: Self-pay | Admitting: Family Medicine

## 2019-06-17 NOTE — Telephone Encounter (Signed)
Copied from [place] [id number]. Topic: Appointment Scheduling - Scheduling Inquiry for Clinic >> Jun 17, 2019  2:47 PM Rayann Heman wrote: Reason for CRM: pt called and stated that he would like to know if he is due for a pneumonia shot and if he can schedule one. Please advise

## 2019-06-17 NOTE — Telephone Encounter (Signed)
Spoke with pt advised that I will call him back with an answer if he needs another PNA 23

## 2019-06-24 ENCOUNTER — Telehealth: Payer: Self-pay | Admitting: Family Medicine

## 2019-06-24 NOTE — Telephone Encounter (Signed)
Patient is calling to check with Dr. Volanda Napoleon to see if he is in need of a pneumonia vaccine.  Please advise Cb- [phone]

## 2019-06-28 DIAGNOSIS — H5202 Hypermetropia, left eye: Secondary | ICD-10-CM | POA: Diagnosis not present

## 2019-06-28 DIAGNOSIS — H401431 Capsular glaucoma with pseudoexfoliation of lens, bilateral, mild stage: Secondary | ICD-10-CM | POA: Diagnosis not present

## 2019-06-28 DIAGNOSIS — H5211 Myopia, right eye: Secondary | ICD-10-CM | POA: Diagnosis not present

## 2019-06-28 NOTE — Telephone Encounter (Signed)
Pt is scheduled for Flu Vaccine on 07/09/2019 at 9.50 am

## 2019-07-09 ENCOUNTER — Other Ambulatory Visit: Payer: Self-pay

## 2019-07-09 ENCOUNTER — Ambulatory Visit (INDEPENDENT_AMBULATORY_CARE_PROVIDER_SITE_OTHER): Payer: Medicare Other

## 2019-07-09 DIAGNOSIS — Z23 Encounter for immunization: Secondary | ICD-10-CM | POA: Diagnosis not present

## 2019-07-19 DIAGNOSIS — F251 Schizoaffective disorder, depressive type: Secondary | ICD-10-CM | POA: Diagnosis not present

## 2019-08-09 DIAGNOSIS — H401431 Capsular glaucoma with pseudoexfoliation of lens, bilateral, mild stage: Secondary | ICD-10-CM | POA: Diagnosis not present

## 2019-09-07 ENCOUNTER — Other Ambulatory Visit: Payer: Self-pay

## 2019-09-07 ENCOUNTER — Other Ambulatory Visit (INDEPENDENT_AMBULATORY_CARE_PROVIDER_SITE_OTHER): Payer: Medicare Other

## 2019-09-07 ENCOUNTER — Other Ambulatory Visit: Payer: Self-pay | Admitting: Family Medicine

## 2019-09-07 DIAGNOSIS — R7989 Other specified abnormal findings of blood chemistry: Secondary | ICD-10-CM | POA: Diagnosis not present

## 2019-09-07 DIAGNOSIS — E039 Hypothyroidism, unspecified: Secondary | ICD-10-CM

## 2019-09-07 DIAGNOSIS — E038 Other specified hypothyroidism: Secondary | ICD-10-CM

## 2019-09-07 LAB — TSH: TSH: 6.51 u[IU]/mL — ABNORMAL HIGH (ref 0.35–4.50)

## 2019-09-07 LAB — T4, FREE: Free T4: 0.58 ng/dL — ABNORMAL LOW (ref 0.60–1.60)

## 2019-09-07 MED ORDER — LEVOTHYROXINE SODIUM 50 MCG PO TABS: 50.0000 ug | ORAL_TABLET | Freq: Every day | ORAL | 3 refills | Status: DC

## 2019-09-16 ENCOUNTER — Other Ambulatory Visit: Payer: Self-pay

## 2019-09-16 DIAGNOSIS — R7989 Other specified abnormal findings of blood chemistry: Secondary | ICD-10-CM

## 2019-10-10 ENCOUNTER — Ambulatory Visit: Payer: Self-pay | Admitting: *Deleted

## 2019-10-10 NOTE — Telephone Encounter (Signed)
Summary: question about covid testing   Patient would like to know if he should be tested for covid- Pt denied appointment, patient requesting to speak with RN. Please advise.      Patient states he lives at [facility] and has questions about the vaccine for COVID.  At this time he does not fall into the Phase One catagory because he is in independent living and not nursing home. He would like to know if he can get vaccine in office when available? Do we have wait list for  the office when the vaccine is available? Patient is also requesting a home stool kit for colon screening- he is asking if we have this test available.  Told patient I would send questions and request to provider for review and response.  Reason for Disposition . [1] Caller requesting NON-URGENT health information AND [2] PCP's office is the best resource  Protocols used: INFORMATION ONLY CALL - NO TRIAGE-A-AH

## 2019-10-11 ENCOUNTER — Telehealth: Payer: Self-pay

## 2019-10-11 NOTE — Telephone Encounter (Signed)
Spoke with pt states that he will be getting his COVID-19 Testing and Vaccination at the facility [facility]. Pt requested to have a cologuard kit sent to his address, [employer] form was mailed out to pt to complete and then mail it back to our office for completing and fax it to [employer] so they can send pt the cologuard kit.

## 2019-10-11 NOTE — Telephone Encounter (Signed)
Copied from [place] [id number]. Topic: General - Other >> Oct 11, 2019  9:40 AM Antonieta Iba C wrote: Reason for CRM: pt called in to request a call back from [facility].pt wouldn't say why call is needed.   Please assist.

## 2019-10-12 NOTE — Telephone Encounter (Signed)
Spoke with pt mailed out a copy of Cologuard form for pt to complete form and then fax it to the office for processing. Waiting on pt to mail form back.

## 2019-10-17 DIAGNOSIS — Z23 Encounter for immunization: Secondary | ICD-10-CM | POA: Diagnosis not present

## 2019-10-17 NOTE — Telephone Encounter (Signed)
Spoke with pt explained how to complete the form and to send it back to the office, pt verbalized understanding

## 2019-10-17 NOTE — Telephone Encounter (Signed)
Patient called after hours line on 1/2 and reports he wants to speak to nurse of Dr. Volanda Napoleon because he does not understand the forms that was sent him

## 2019-10-25 ENCOUNTER — Other Ambulatory Visit (INDEPENDENT_AMBULATORY_CARE_PROVIDER_SITE_OTHER): Payer: Medicare Other

## 2019-10-25 ENCOUNTER — Other Ambulatory Visit: Payer: Self-pay

## 2019-10-25 DIAGNOSIS — E038 Other specified hypothyroidism: Secondary | ICD-10-CM

## 2019-10-25 DIAGNOSIS — R7989 Other specified abnormal findings of blood chemistry: Secondary | ICD-10-CM

## 2019-10-25 DIAGNOSIS — E039 Hypothyroidism, unspecified: Secondary | ICD-10-CM

## 2019-10-25 LAB — T4, FREE: Free T4: 0.77 ng/dL (ref 0.60–1.60)

## 2019-10-25 LAB — TSH: TSH: 1.95 u[IU]/mL (ref 0.35–4.50)

## 2019-10-28 ENCOUNTER — Telehealth: Payer: Self-pay

## 2019-10-28 NOTE — Telephone Encounter (Signed)
Pt cologuard form was refax to [facility]

## 2019-11-15 DIAGNOSIS — Z1212 Encounter for screening for malignant neoplasm of rectum: Secondary | ICD-10-CM | POA: Diagnosis not present

## 2019-11-15 DIAGNOSIS — Z1211 Encounter for screening for malignant neoplasm of colon: Secondary | ICD-10-CM | POA: Diagnosis not present

## 2019-11-19 LAB — HM COLONOSCOPY

## 2019-11-19 LAB — COLOGUARD: Cologuard: NEGATIVE

## 2019-12-07 ENCOUNTER — Telehealth: Payer: Self-pay | Admitting: Family Medicine

## 2019-12-07 NOTE — Telephone Encounter (Signed)
Pt received a letter that [employer] sent over the report of the results from the Cologuard and he would like a call back about the results.  Pt can be reached at [phone] per pt to leave detailed message on that number

## 2019-12-09 NOTE — Telephone Encounter (Signed)
Called pt left a detailed message with pt Cologuard results, advised pt to call the office for further information

## 2019-12-13 ENCOUNTER — Encounter: Payer: Self-pay | Admitting: Family Medicine

## 2019-12-13 DIAGNOSIS — H401431 Capsular glaucoma with pseudoexfoliation of lens, bilateral, mild stage: Secondary | ICD-10-CM | POA: Diagnosis not present

## 2019-12-13 NOTE — Telephone Encounter (Signed)
Cologuard results abstracted

## 2019-12-13 NOTE — Telephone Encounter (Signed)
Spoke with pt verbalized that his cologuard results were Negative

## 2019-12-15 DIAGNOSIS — F251 Schizoaffective disorder, depressive type: Secondary | ICD-10-CM | POA: Diagnosis not present

## 2020-01-03 ENCOUNTER — Other Ambulatory Visit: Payer: Self-pay | Admitting: Family Medicine

## 2020-01-03 DIAGNOSIS — E039 Hypothyroidism, unspecified: Secondary | ICD-10-CM

## 2020-01-27 DIAGNOSIS — F251 Schizoaffective disorder, depressive type: Secondary | ICD-10-CM | POA: Diagnosis not present

## 2020-05-31 ENCOUNTER — Encounter: Payer: Medicare Other | Admitting: Family Medicine

## 2020-06-04 ENCOUNTER — Ambulatory Visit (INDEPENDENT_AMBULATORY_CARE_PROVIDER_SITE_OTHER): Payer: Medicare Other | Admitting: Family Medicine

## 2020-06-04 ENCOUNTER — Other Ambulatory Visit: Payer: Self-pay

## 2020-06-04 ENCOUNTER — Encounter: Payer: Self-pay | Admitting: Family Medicine

## 2020-06-04 ENCOUNTER — Telehealth: Payer: Self-pay | Admitting: Family Medicine

## 2020-06-04 VITALS — BP 110/70 | HR 83 | Temp 98.8°F | Wt 231.0 lb

## 2020-06-04 DIAGNOSIS — B372 Candidiasis of skin and nail: Secondary | ICD-10-CM

## 2020-06-04 DIAGNOSIS — R58 Hemorrhage, not elsewhere classified: Secondary | ICD-10-CM

## 2020-06-04 DIAGNOSIS — R251 Tremor, unspecified: Secondary | ICD-10-CM | POA: Diagnosis not present

## 2020-06-04 DIAGNOSIS — N62 Hypertrophy of breast: Secondary | ICD-10-CM | POA: Diagnosis not present

## 2020-06-04 DIAGNOSIS — G4700 Insomnia, unspecified: Secondary | ICD-10-CM | POA: Diagnosis not present

## 2020-06-04 DIAGNOSIS — E039 Hypothyroidism, unspecified: Secondary | ICD-10-CM

## 2020-06-04 DIAGNOSIS — K644 Residual hemorrhoidal skin tags: Secondary | ICD-10-CM

## 2020-06-04 DIAGNOSIS — Z0000 Encounter for general adult medical examination without abnormal findings: Secondary | ICD-10-CM | POA: Diagnosis not present

## 2020-06-04 MED ORDER — NYSTATIN-TRIAMCINOLONE 100000-0.1 UNIT/GM-% EX OINT: 1.0000 "application " | TOPICAL_OINTMENT | Freq: Two times a day (BID) | CUTANEOUS | 0 refills | Status: DC

## 2020-06-04 MED ORDER — HYDROCORTISONE (PERIANAL) 2.5 % EX CREA: 1.0000 "application " | TOPICAL_CREAM | Freq: Two times a day (BID) | CUTANEOUS | 0 refills | Status: DC

## 2020-06-04 NOTE — Progress Notes (Signed)
Wellness Office Visit  Subjective:  Patient ID: Gary Osborn, male    DOB: 1933-02-18  Age: 86 y.o. MRN: [id number]  CC: No chief complaint on file.   HPI Gary Osborn presents for annual visit.  Patient states he is doing well overall.  Has noticed some blood when wiping rectum.  Denies blood in toilet or on stools.  Using wet wipes.at times has irritation after wiping.  Notes hemorrhoids in the past but is unsure if he has any currently.  Denies weakness, dizziness, constipation.  Taking Colace daily.  Pt states sleep is not as good as it could be.  Goes to bed at 8 PM and will wake up at 3-4 AM.  At times will drink a glass of wine to go back to sleep.  Pt followed by psychiatry, Dr. Casimiro Osborn for history of MDD.  Pt taking BuSpar 10 mg BID, clonazepam 0.5 mg BID prn, and Paxil 40 mg qhs.  States no longer taking Remeron 45 mg.  Pt has noticed increased growth of breast tissue over the last few years.  Pt denies pain, nipple d/c.  States at times skin itches.  Also endorses easy bruising.  Pt on Synthroid 50 mcg daily without issue.  Currently resides at [facility]. Past Medical History:  Diagnosis Date  . ALLERGIC RHINITIS 10/05/2007  . ANXIETY 10/05/2007  . COLONIC POLYPS, HX OF 10/05/2007  . DEPRESSION 10/04/2008  . PROSTATE CANCER, UNSPEC. 10/05/2007  . 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 NASAL SINUS SURGERY    . PROSTATE SURGERY    . singulotomy     for OCD  . TONSILLECTOMY      Family History  Problem Relation Age of Onset  . Prostate cancer Father   . Breast cancer Sister     Social History   Socioeconomic History  . Marital status: Married    Spouse name: Not on file  . Number of children: 2  . Years of education: Not on file  . Highest education level: Not on file  Occupational History  . Occupation: Retired    Fish farm manager: RETIRED  Tobacco Use  . Smoking status: Former Smoker    Types: Cigarettes      Quit date: 10/13/1973    Years since quitting: 46.6  . Smokeless tobacco: Never Used  Substance and Sexual Activity  . Alcohol use: Yes    Alcohol/week: 28.0 standard drinks    Types: 14 Standard drinks or equivalent, 14 Cans of beer per week    Comment: glass of chardonnay each night  . Drug use: No  . Sexual activity: Not on file  Other Topics Concern  . Not on file  Social History Narrative  . Not on file   Social Determinants of Health   Financial Resource Strain:   . Difficulty of Paying Living Expenses: Not on file  Food Insecurity:   . Worried About Charity fundraiser in the Last Year: Not on file  . Ran Out of Food in the Last Year: Not on file  Transportation Needs:   . Lack of Transportation (Medical): Not on file  . Lack of Transportation (Non-Medical): Not on file  Physical Activity:   . Days of Exercise per Week: Not on file  . Minutes of Exercise per Session: Not on file  Stress:   . Feeling of Stress : Not on file  Social Connections:   . Frequency of Communication with  Friends and Family: Not on file  . Frequency of Social Gatherings with Friends and Family: Not on file  . Attends Religious Services: Not on file  . Active Member of Clubs or Organizations: Not on file  . Attends Archivist Meetings: Not on file  . Marital Status: Not on file  Intimate Partner Violence:   . Fear of Current or Ex-Partner: Not on file  . Emotionally Abused: Not on file  . Physically Abused: Not on file  . Sexually Abused: Not on file    ROS Review of Systems  Constitutional: Negative.   HENT: Negative.   Gastrointestinal: Negative for abdominal pain, blood in stool, constipation, diarrhea, nausea, rectal pain and vomiting.       Rectal irritation, blood on tissue  Endocrine: Negative.   Musculoskeletal: Negative.   Skin:       Ecchymosis  Neurological: Positive for tremors.  Hematological: Bruises/bleeds easily.  Psychiatric/Behavioral: Positive for  sleep disturbance. Negative for agitation and behavioral problems. The patient is not nervous/anxious.      Objective:   Today's Vitals: BP 110/70 (BP Location: Left Arm, Patient Position: Sitting, Cuff Size: Normal)   Pulse 83   Temp 98.8 F (37.1 C) (Oral)   Wt 231 lb (104.8 kg)   SpO2 95%   BMI 33.15 kg/m   Physical Exam Gen. Pleasant, well developed, well-nourished, in NAD HEENT - [place]/AT, PERRL, EOMI, conjunctive clear, no scleral icterus, no nasal drainage, pharynx without erythema or exudate.  Canals and TMs normal bilaterally.  Patient hard of hearing. Neck: No JVD, no thyromegaly, no carotid bruits Lungs: no use of accessory muscles, CTAB, no wheezes, rales or rhonchi Cardiovascular: RRR,No r/g/m, no peripheral edema Abdomen: BS present, soft, nontender,nondistended, no hepatosplenomegaly Musculoskeletal: No deformities, moves all four extremities, no cyanosis or clubbing, normal tone Neuro:  A&Ox3, CN II-XII intact, normal gait.  Pill-rolling tremor at rest in b/l hands.  No cogwheel rigidity Skin:  Warm, dry, intact.  Gynecomastia appreciated.  Skin tags and several seborrheic keratosis noted Psych: normal affect, mood appropriate GU: Normal external male genitalia.  Erythema and satellite lesions around skin of anus.  Small hemorrhoid noted.  DRE performed.  Rectum smooth.  Prostate not appreciated.  Guaiac negative   Assessment & Plan:   Gynecomastia  -Ongoing -Discussed possible causes including medications, hormonal changes -We will obtain labs -If needed will obtain imaging to rule out tumor - Plan: TSH, Luteinizing Hormone, Estradiol, Testosterone, Testosterone, Estradiol, Luteinizing Hormone, TSH, Prolactin  Acquired hypothyroidism  -Stable -Continue Synthroid 50 mcg daily - Plan: TSH, Lipid panel, Lipid panel, TSH  Insomnia, unspecified type -Sleep hygiene -Discussed with psychiatry as patient currently taking Klonopin at night and previously on  Remeron -Plan: CBC with Differential/Platelets, CBC with Differential/Platelets  Residual hemorrhoidal skin tags  - Plan: hydrocortisone (PROCTOSOL HC) 2.5 % rectal cream  Candidal dermatitis  - Plan: nystatin-triamcinolone ointment (MYCOLOG)  Ecchymosis  - Plan: CMP with eGFR([facility]), CBC with Differential/Platelets  Medicare annual wellness visit, subsequent  Pill rolling tremor -b/l hands -Stable -possible causes include medication and Parkinson's Dz.given h/o anxiety/depression -consider f/u with Neuro -will continue to monitor   Outpatient Encounter Medications as of 06/04/2020  Medication Sig  . busPIRone (BUSPAR) 10 MG tablet Take 10 mg by mouth 2 (two) times daily. Take 2 tabs twice daily  . clonazePAM (KLONOPIN) 0.5 MG tablet Take 1 tablet (0.5 mg total) by mouth 2 (two) times daily as needed. For severe anxiety  . docusate sodium (COLACE)  100 MG capsule Take 100 mg by mouth daily.  . hydrocortisone (PROCTOSOL HC) 2.5 % rectal cream Place 1 application rectally 2 (two) times daily.  Marland Kitchen levothyroxine (SYNTHROID) 50 MCG tablet TAKE 1 TABLET ONCE DAILY BEFORE BREAKFAST.  . mirtazapine (REMERON) 45 MG tablet Take 0.5 tablets (22.5 mg total) by mouth at bedtime. For depression  . Multiple Vitamins-Minerals (CENTRUM SILVER PO) Take by mouth daily.  Marland Kitchen nystatin-triamcinolone ointment (MYCOLOG) Apply 1 application topically 2 (two) times daily.  Marland Kitchen PARoxetine (PAXIL) 40 MG tablet Take 1 tablet (40 mg total) by mouth at bedtime. For depression   No facility-administered encounter medications on file as of 06/04/2020.    Follow-up: Return in about 1 month (around 07/05/2020).  To follow-up on gynecomastia and tremor.  Billie Ruddy, MD

## 2020-06-04 NOTE — Patient Instructions (Signed)
Preventive Care 84 Years and Older, Male Preventive care refers to lifestyle choices and visits with your health care provider that can promote health and wellness. This includes:  A yearly physical exam. This is also called an annual well check.  Regular dental and eye exams.  Immunizations.  Screening for certain conditions.  Healthy lifestyle choices, such as diet and exercise. What can I expect for my preventive care visit? Physical exam Your health care provider will check:  Height and weight. These may be used to calculate body mass index (BMI), which is a measurement that tells if you are at a healthy weight.  Heart rate and blood pressure.  Your skin for abnormal spots. Counseling Your health care provider may ask you questions about:  Alcohol, tobacco, and drug use.  Emotional well-being.  Home and relationship well-being.  Sexual activity.  Eating habits.  History of falls.  Memory and ability to understand (cognition).  Work and work environment. What immunizations do I need?  Influenza (flu) vaccine  This is recommended every year. Tetanus, diphtheria, and pertussis (Tdap) vaccine  You may need a Td booster every 10 years. Varicella (chickenpox) vaccine  You may need this vaccine if you have not already been vaccinated. Zoster (shingles) vaccine  You may need this after age 60. Pneumococcal conjugate (PCV13) vaccine  One dose is recommended after age 65. Pneumococcal polysaccharide (PPSV23) vaccine  One dose is recommended after age 65. Measles, mumps, and rubella (MMR) vaccine  You may need at least one dose of MMR if you were born in 1957 or later. You may also need a second dose. Meningococcal conjugate (MenACWY) vaccine  You may need this if you have certain conditions. Hepatitis A vaccine  You may need this if you have certain conditions or if you travel or work in places where you may be exposed to hepatitis A. Hepatitis B  vaccine  You may need this if you have certain conditions or if you travel or work in places where you may be exposed to hepatitis B. Haemophilus influenzae type b (Hib) vaccine  You may need this if you have certain conditions. You may receive vaccines as individual doses or as more than one vaccine together in one shot (combination vaccines). Talk with your health care provider about the risks and benefits of combination vaccines. What tests do I need? Blood tests  Lipid and cholesterol levels. These may be checked every 5 years, or more frequently depending on your overall health.  Hepatitis C test.  Hepatitis B test. Screening  Lung cancer screening. You may have this screening every year starting at age 55 if you have a 30-pack-year history of smoking and currently smoke or have quit within the past 15 years.  Colorectal cancer screening. All adults should have this screening starting at age 50 and continuing until age 75. Your health care provider may recommend screening at age 45 if you are at increased risk. You will have tests every 1-10 years, depending on your results and the type of screening test.  Prostate cancer screening. Recommendations will vary depending on your family history and other risks.  Diabetes screening. This is done by checking your blood sugar (glucose) after you have not eaten for a while (fasting). You may have this done every 1-3 years.  Abdominal aortic aneurysm (AAA) screening. You may need this if you are a current or former smoker.  Sexually transmitted disease (STD) testing. Follow these instructions at home: Eating and drinking  Eat   a diet that includes fresh fruits and vegetables, whole grains, lean protein, and low-fat dairy products. Limit your intake of foods with high amounts of sugar, saturated fats, and salt.  Take vitamin and mineral supplements as recommended by your health care provider.  Do not drink alcohol if your health care  provider tells you not to drink.  If you drink alcohol: ? Limit how much you have to 0-2 drinks a day. ? Be aware of how much alcohol is in your drink. In the [place] one drink equals one 12 oz bottle of beer (355 mL), one 5 oz glass of wine (148 mL), or one 1 oz glass of hard liquor (44 mL). Lifestyle  Take daily care of your teeth and gums.  Stay active. Exercise for at least 30 minutes on 5 or more days each week.  Do not use any products that contain nicotine or tobacco, such as cigarettes, e-cigarettes, and chewing tobacco. If you need help quitting, ask your health care provider.  If you are sexually active, practice safe sex. Use a condom or other form of protection to prevent STIs (sexually transmitted infections).  Talk with your health care provider about taking a low-dose aspirin or statin. What's next?  Visit your health care provider once a year for a well check visit.  Ask your health care provider how often you should have your eyes and teeth checked.  Stay up to date on all vaccines. This information is not intended to replace advice given to you by your health care provider. Make sure you discuss any questions you have with your health care provider. Document Revised: 09/23/2018 Document Reviewed: 09/23/2018 Elsevier Patient Education  2020 [employer].  Hypothyroidism  Hypothyroidism is when the thyroid gland does not make enough of certain hormones (it is underactive). The thyroid gland is a small gland located in the lower front part of the neck, just in front of the windpipe (trachea). This gland makes hormones that help control how the body uses food for energy (metabolism) as well as how the heart and brain function. These hormones also play a role in keeping your bones strong. When the thyroid is underactive, it produces too little of the hormones thyroxine (T4) and triiodothyronine (T3). What are the causes? This condition may be caused by:  Hashimoto's  disease. This is a disease in which the body's disease-fighting system (immune system) attacks the thyroid gland. This is the most common cause.  Viral infections.  Pregnancy.  Certain medicines.  Birth defects.  Past radiation treatments to the head or neck for cancer.  Past treatment with radioactive iodine.  Past exposure to radiation in the environment.  Past surgical removal of part or all of the thyroid.  Problems with a gland in the center of the brain (pituitary gland).  Lack of enough iodine in the diet. What increases the risk? You are more likely to develop this condition if:  You are male.  You have a family history of thyroid conditions.  You use a medicine called lithium.  You take medicines that affect the immune system (immunosuppressants). What are the signs or symptoms? Symptoms of this condition include:  Feeling as though you have no energy (lethargy).  Not being able to tolerate cold.  Weight gain that is not explained by a change in diet or exercise habits.  Lack of appetite.  Dry skin.  Coarse hair.  Menstrual irregularity.  Slowing of thought processes.  Constipation.  Sadness or depression. How is  this diagnosed? This condition may be diagnosed based on:  Your symptoms, your medical history, and a physical exam.  Blood tests. You may also have imaging tests, such as an ultrasound or MRI. How is this treated? This condition is treated with medicine that replaces the thyroid hormones that your body does not make. After you begin treatment, it may take several weeks for symptoms to go away. Follow these instructions at home:  Take over-the-counter and prescription medicines only as told by your health care provider.  If you start taking any new medicines, tell your health care provider.  Keep all follow-up visits as told by your health care provider. This is important. ? As your condition improves, your dosage of thyroid  hormone medicine may change. ? You will need to have blood tests regularly so that your health care provider can monitor your condition. Contact a health care provider if:  Your symptoms do not get better with treatment.  You are taking thyroid replacement medicine and you: ? Sweat a lot. ? Have tremors. ? Feel anxious. ? Lose weight rapidly. ? Cannot tolerate heat. ? Have emotional swings. ? Have diarrhea. ? Feel weak. Get help right away if you have:  Chest pain.  An irregular heartbeat.  A rapid heartbeat.  Difficulty breathing. Summary  Hypothyroidism is when the thyroid gland does not make enough of certain hormones (it is underactive).  When the thyroid is underactive, it produces too little of the hormones thyroxine (T4) and triiodothyronine (T3).  The most common cause is Hashimoto's disease, a disease in which the body's disease-fighting system (immune system) attacks the thyroid gland. The condition can also be caused by viral infections, medicine, pregnancy, or past radiation treatment to the head or neck.  Symptoms may include weight gain, dry skin, constipation, feeling as though you do not have energy, and not being able to tolerate cold.  This condition is treated with medicine to replace the thyroid hormones that your body does not make. This information is not intended to replace advice given to you by your health care provider. Make sure you discuss any questions you have with your health care provider. Document Revised: 09/11/2017 Document Reviewed: 09/09/2017 Elsevier Patient Education  2020 [employer].  Hemorrhoids Hemorrhoids are swollen veins that may develop:  In the butt (rectum). These are called internal hemorrhoids.  Around the opening of the butt (anus). These are called external hemorrhoids. Hemorrhoids can cause pain, itching, or bleeding. Most of the time, they do not cause serious problems. They usually get better with diet changes,  lifestyle changes, and other home treatments. What are the causes? This condition may be caused by:  Having trouble pooping (constipation).  Pushing hard (straining) to poop.  Watery poop (diarrhea).  Pregnancy.  Being very overweight (obese).  Sitting for long periods of time.  Heavy lifting or other activity that causes you to strain.  Anal sex.  Riding a bike for a long period of time. What are the signs or symptoms? Symptoms of this condition include:  Pain.  Itching or soreness in the butt.  Bleeding from the butt.  Leaking poop.  Swelling in the area.  One or more lumps around the opening of your butt. How is this diagnosed? A doctor can often diagnose this condition by looking at the affected area. The doctor may also:  Do an exam that involves feeling the area with a gloved hand (digital rectal exam).  Examine the area inside your butt using a  small tube (anoscope).  Order blood tests. This may be done if you have lost a lot of blood.  Have you get a test that involves looking inside the colon using a flexible tube with a camera on the end (sigmoidoscopy or colonoscopy). How is this treated? This condition can usually be treated at home. Your doctor may tell you to change what you eat, make lifestyle changes, or try home treatments. If these do not help, procedures can be done to remove the hemorrhoids or make them smaller. These may involve:  Placing rubber bands at the base of the hemorrhoids to cut off their blood supply.  Injecting medicine into the hemorrhoids to shrink them.  Shining a type of light energy onto the hemorrhoids to cause them to fall off.  Doing surgery to remove the hemorrhoids or cut off their blood supply. Follow these instructions at home: Eating and drinking   Eat foods that have a lot of fiber in them. These include whole grains, beans, nuts, fruits, and vegetables.  Ask your doctor about taking products that have added  fiber (fibersupplements).  Reduce the amount of fat in your diet. You can do this by: ? Eating low-fat dairy products. ? Eating less red meat. ? Avoiding processed foods.  Drink enough fluid to keep your pee (urine) pale yellow. Managing pain and swelling   Take a warm-water bath (sitz bath) for 20 minutes to ease pain. Do this 3-4 times a day. You may do this in a bathtub or using a portable sitz bath that fits over the toilet.  If told, put ice on the painful area. It may be helpful to use ice between your warm baths. ? Put ice in a plastic bag. ? Place a towel between your skin and the bag. ? Leave the ice on for 20 minutes, 2-3 times a day. General instructions  Take over-the-counter and prescription medicines only as told by your doctor. ? Medicated creams and medicines may be used as told.  Exercise often. Ask your doctor how much and what kind of exercise is best for you.  Go to the bathroom when you have the urge to poop. Do not wait.  Avoid pushing too hard when you poop.  Keep your butt dry and clean. Use wet toilet paper or moist towelettes after pooping.  Do not sit on the toilet for a long time.  Keep all follow-up visits as told by your doctor. This is important. Contact a doctor if you:  Have pain and swelling that do not get better with treatment or medicine.  Have trouble pooping.  Cannot poop.  Have pain or swelling outside the area of the hemorrhoids. Get help right away if you have:  Bleeding that will not stop. Summary  Hemorrhoids are swollen veins in the butt or around the opening of the butt.  They can cause pain, itching, or bleeding.  Eat foods that have a lot of fiber in them. These include whole grains, beans, nuts, fruits, and vegetables.  Take a warm-water bath (sitz bath) for 20 minutes to ease pain. Do this 3-4 times a day. This information is not intended to replace advice given to you by your health care provider. Make sure you  discuss any questions you have with your health care provider. Document Revised: 10/07/2018 Document Reviewed: 02/18/2018 Elsevier Patient Education  2020 [employer]  Gynecomastia, Adult Gynecomastia is an overgrowth of gland tissue in a man's breasts. This may cause one or both breasts  to become enlarged. The condition often develops in men who have an imbalance of the male sex hormone (testosterone) and the male sex hormone (estrogen). This means that a man may have too much estrogen, too little testosterone, or both. Gynecomastia may be a normal part of aging for some men. It can also happen to adolescent boys during puberty. What are the causes? This condition may be caused by:  Certain medicines, such as: ? Estrogen supplements and medicines that act like estrogen in the body. ? Medicines that keep testosterone from functioning normally in the body (testosterone-inhibiting drugs). ? Anabolic steroids. ? Medicines to treat heartburn, cancer, heart disease, mental health problems, HIV, or AIDS. ? Antibiotic medicine. ? Chemotherapy medicine.  Recreational drugs, including alcohol, marijuana, and opioids.  Herbal products, including lavender and tea tree oil.  A gene that is passed from parent to child (inherited).  Certain medical conditions, such as: ? Tumors in the pituitary or adrenal gland. ? An overactive thyroid gland. ? Certain inherited disorders, including a genetic disease that causes low testosterone in males (Klinefelter syndrome). ? Cancer of the lung, kidney, liver, testicle, or gastrointestinal tract. ? Conditions that cause liver or kidney failure. ? Poor nutrition and starvation. ? Testicle shrinking or failure (testicularatrophy). In some cases, the cause may not be known. What increases the risk? The following factors may make you more likely to develop this condition:  Being 57 years old or older.  Being overweight.  Abusing alcohol or other  drugs.  Having a family history of gynecomastia. What are the signs or symptoms? In most cases, breast enlargement is the only symptom. The enlargement may start near the nipple, and the breast tissue may feel firm and rubbery. Other symptoms may include:  Pain or tenderness in the breasts.  Itchy breasts. How is this diagnosed? This condition may be diagnosed based on:  Your symptoms and medical history.  A physical exam.  Imaging tests, such as: ? An ultrasound. ? A mammogram. ? An MRI.  Blood tests.  Removal of a sample of breast tissue to be tested in a lab (biopsy). How is this treated? This condition may go away on its own, without treatment. If gynecomastia is caused by a medical problem or drug abuse, treatment may include:  Getting treatment for the underlying medical problem or for drug abuse.  Changing or stopping medicines.  Medicines to block the effects of estrogen.  Taking a testosterone replacement.  Surgery to remove breast tissue or any lumps in your breasts.  Breast reduction surgery. This may be an option if you have severe or painful gynecomastia. Follow these instructions at home:   Take over-the-counter and prescription medicines only as told by your health care provider.  Talk to your health care provider before taking any herbal medicines or diet supplements.  Do not abuse drugs or alcohol.  Keep all follow-up visits as told by your health care provider. This is important. Contact a health care provider if:  Your breast tissue grows larger or gets more swollen or painful.  You have a lump in your testicle.  You have blood or discharge coming from your nipples.  Your nipple changes shape.  You develop a hard or painful lump in your breast. Summary  Gynecomastia is an overgrowth of gland tissue in a man's breasts. This may cause one or both breasts to become enlarged.  In most cases, breast enlargement is the only symptom. The  enlargement may start near the nipple, and  the breast tissue may feel firm and rubbery.  This condition may go away on its own, without treatment. In some cases, treatment for an underlying medical problem or for drug abuse may be needed.  Take over-the-counter and prescription medicines only as told by your health care provider.  Do not abuse drugs or alcohol. This information is not intended to replace advice given to you by your health care provider. Make sure you discuss any questions you have with your health care provider. Document Revised: 03/24/2019 Document Reviewed: 03/24/2019 Elsevier Patient Education  [place].  Genital Yeast Infection, Male In men, a genital yeast infection is a condition that causes soreness, swelling, and redness (inflammation) of the head of the penis (glans penis). A genital yeast infection can be spread through sexual contact, but it can also develop without sexual contact. If the infection is not treated properly, it is likely to come back. What are the causes? This condition is caused by a change in the normal balance of the yeast and bacteria that live on the skin. This change causes an overgrowth of yeast, which causes the inflammation. Many types of yeast can cause this infection, but Candida is the most common. What increases the risk? The following factors may make you more likely to develop this condition:  Taking antibiotics.  Having diabetes.  Being exposed to the infection by a sexual partner.  Being uncircumcised.  Having a weak body defense system (immune system).  Taking steroid medicines for a long time.  Having poor hygiene. What are the signs or symptoms? Symptoms of this condition include:  Itching of the groin and penis.  Dry, red, or cracked skin on the penis.  Swelling of the genital area.  Pain while urinating or difficulty urinating.  Thick, bad-smelling discharge on the penis. How is this diagnosed? This  condition may be diagnosed based on:  Your medical history.  A physical exam. You may also have tests, such as:  Test of a sample of discharge from the penis.  Urine tests.  Blood tests. How is this treated? This condition is treated with:  Anti-fungal creams or medicines. Anti-fungal medicines may be prescribed by your health care provider or they may be available over-the-counter.  Self-care at home. For men who are not circumcised, circumcision may be recommended to control infections that return and are difficult to treat. Follow these instructions at home: Medicines   Take or apply over-the-counter and prescription medicines only as told by your health care provider.  Take your anti-fungal medicine as told by your health care provider. Do not stop taking the medicine even if you start to feel better. Self care  Wash your penis with soap and water every day. If you are not circumcised, pull back the foreskin to wash. Make sure to dry your penis completely after washing.  Wear breathable, cotton underwear.  Keep your underwear clean and dry. General instructions  Do not have sex until your health care provider has approved. Tell your sexual partner that you have a yeast infection. That person should go for treatment even if no symptoms are present.  If you have diabetes, keep your blood sugar levels within your target range.  Keep all follow-up visits as told by your health care provider. This is important. Contact a health care provider if you:  Have a fever.  Have symptoms that go away and then return.  Do not get better with treatment.  Have symptoms that get worse.  Have  new symptoms. Get help right away if:  Your swelling and inflammation become so severe that you cannot urinate. Summary  In men, a genital yeast infection is a condition that causes soreness, swelling, and redness (inflammation) of the head of the penis (glans penis).  This condition  is caused by a change in the normal balance of the yeast and bacteria that live on the skin. This change causes an overgrowth of yeast, which causes the inflammation.  A genital yeast infection usually spreads through sexual contact, but it can develop without sexual contact. For instance, you may be more likely to develop this infection if you take antibiotics or steroids, have diabetes, are not circumcised, have a weak immune system, or have poor hygiene.  This condition is treated with anti-fungal cream or pills along with self-care at home. This information is not intended to replace advice given to you by your health care provider. Make sure you discuss any questions you have with your health care provider. Document Revised: 11/03/2017 Document Reviewed: 11/03/2017 Elsevier Patient Education  2020 [employer].

## 2020-06-04 NOTE — Telephone Encounter (Signed)
I attempted to contact patient x 2 today to complete the medicare wellness visit and also left a voicemail x2. On 2nd voicemail I informed patient to call office front desk back and reschedule this appointment.

## 2020-06-04 NOTE — Progress Notes (Signed)
Erroneous encounter

## 2020-06-05 LAB — COMPLETE METABOLIC PANEL WITH GFR
AG Ratio: 2 (calc) (ref 1.0–2.5)
ALT: 28 U/L (ref 9–46)
AST: 19 U/L (ref 10–35)
Albumin: 4.3 g/dL (ref 3.6–5.1)
Alkaline phosphatase (APISO): 83 U/L (ref 35–144)
BUN: 22 mg/dL (ref 7–25)
CO2: 25 mmol/L (ref 20–32)
Calcium: 9.2 mg/dL (ref 8.6–10.3)
Chloride: 106 mmol/L (ref 98–110)
Creat: 1.01 mg/dL (ref 0.70–1.11)
GFR, Est African American: 78 mL/min/{1.73_m2} (ref >=60)
GFR, Est Non African American: 68 mL/min/{1.73_m2} (ref >=60)
Globulin: 2.2 g/dL (calc) (ref 1.9–3.7)
Glucose, Bld: 109 mg/dL — ABNORMAL HIGH (ref 65–99)
Potassium: 4.5 mmol/L (ref 3.5–5.3)
Sodium: 139 mmol/L (ref 135–146)
Total Bilirubin: 0.7 mg/dL (ref 0.2–1.2)
Total Protein: 6.5 g/dL (ref 6.1–8.1)

## 2020-06-05 LAB — CBC WITH DIFFERENTIAL/PLATELET
Absolute Monocytes: 330 cells/uL (ref 200–950)
Basophils Absolute: 29 cells/uL (ref 0–200)
Basophils Relative: 0.9 %
Eosinophils Absolute: 51 cells/uL (ref 15–500)
Eosinophils Relative: 1.6 %
HCT: 41.7 % (ref 38.5–50.0)
Hemoglobin: 14.2 g/dL (ref 13.2–17.1)
Lymphs Abs: 739 cells/uL — ABNORMAL LOW (ref 850–3900)
MCH: 33.3 pg — ABNORMAL HIGH (ref 27.0–33.0)
MCHC: 34.1 g/dL (ref 32.0–36.0)
MCV: 97.7 fL (ref 80.0–100.0)
MPV: 10.3 fL (ref 7.5–12.5)
Monocytes Relative: 10.3 %
Neutro Abs: 2051 cells/uL (ref 1500–7800)
Neutrophils Relative %: 64.1 %
Platelets: 165 10*3/uL (ref 140–400)
RBC: 4.27 10*6/uL (ref 4.20–5.80)
RDW: 12.5 % (ref 11.0–15.0)
Total Lymphocyte: 23.1 %
WBC: 3.2 10*3/uL — ABNORMAL LOW (ref 3.8–10.8)

## 2020-06-05 LAB — LIPID PANEL
Cholesterol: 143 mg/dL (ref <200)
HDL: 53 mg/dL (ref >=40)
LDL Cholesterol (Calc): 73 mg/dL (calc)
Non-HDL Cholesterol (Calc): 90 mg/dL (calc) (ref <130)
Total CHOL/HDL Ratio: 2.7 (calc) (ref <5.0)
Triglycerides: 90 mg/dL (ref <150)

## 2020-06-05 LAB — ESTRADIOL: Estradiol: <15 pg/mL (ref <=39)

## 2020-06-05 LAB — TSH: TSH: 2.24 mIU/L (ref 0.40–4.50)

## 2020-06-05 LAB — TESTOSTERONE: Testosterone: 14 ng/dL — ABNORMAL LOW (ref 250–827)

## 2020-06-05 LAB — LUTEINIZING HORMONE: LH: 29 m[IU]/mL — ABNORMAL HIGH (ref 1.6–15.2)

## 2020-06-11 ENCOUNTER — Other Ambulatory Visit: Payer: Self-pay | Admitting: Family Medicine

## 2020-06-11 ENCOUNTER — Telehealth: Payer: Self-pay | Admitting: Family Medicine

## 2020-06-11 DIAGNOSIS — K644 Residual hemorrhoidal skin tags: Secondary | ICD-10-CM

## 2020-06-11 NOTE — Telephone Encounter (Signed)
Message unclear.  If pt is no longer having pain/irritation he does not need to continue cream.

## 2020-06-11 NOTE — Telephone Encounter (Signed)
Pt is calling in to see if he can get a refill on his hydrocortisone (PROTOSOL HC) 2.5% cream.  Pt state that it really helped him and really need it.  Pharm:  [facility]

## 2020-06-11 NOTE — Telephone Encounter (Signed)
Spoke with pt state that he overused the Hydrocortisone cream squeezed too much that it only lasted for 1 week, pt state that the pain he had is gone but wants to know if he should stop since he is out of the cream. Please advise

## 2020-06-12 ENCOUNTER — Other Ambulatory Visit: Payer: Self-pay

## 2020-06-12 ENCOUNTER — Ambulatory Visit: Payer: Medicare Other

## 2020-06-12 DIAGNOSIS — Z0000 Encounter for general adult medical examination without abnormal findings: Secondary | ICD-10-CM

## 2020-06-12 NOTE — Telephone Encounter (Signed)
Spoke with pt state that he is no longer having any pain, advised that pt does not need Rx refilled since he is better. Verbalized understanding

## 2020-06-12 NOTE — Patient Instructions (Signed)
[name] , Thank you for taking time to come for your Medicare Wellness Visit. I appreciate your ongoing commitment to your health goals. Please review the following plan we discussed and let me know if I can assist you in the future.   Screening recommendations/referrals: Colonoscopy: No longer required  Recommended yearly ophthalmology/optometry visit for glaucoma screening and checkup Recommended yearly dental visit for hygiene and checkup  Vaccinations: Influenza vaccine: Up to date, next due this fall 2021 Pneumococcal vaccine: completed series Tdap vaccine: Currently due, you may contact your insurance to discuss cost or you may await and injury  Shingles vaccine: Currently due for shingrix, you may contact your pharmacy to discuss cost and to receive the vaccines    Advanced directives: Please bring a copy of your Advanced directives to your next office visit so that we may scan them into your chart.  Conditions/risks identified: Please try to increase the amount of plain of water and discuss amounts of coffee and tea  Next appointment: 07/11/2020 @ 10:30 am with Dr. Volanda Napoleon   Preventive Care 65 Years and Older, Male Preventive care refers to lifestyle choices and visits with your health care provider that can promote health and wellness. What does preventive care include?  A yearly physical exam. This is also called an annual well check.  Dental exams once or twice a year.  Routine eye exams. Ask your health care provider how often you should have your eyes checked.  Personal lifestyle choices, including:  Daily care of your teeth and gums.  Regular physical activity.  Eating a healthy diet.  Avoiding tobacco and drug use.  Limiting alcohol use.  Practicing safe sex.  Taking low doses of aspirin every day.  Taking vitamin and mineral supplements as recommended by your health care provider. What happens during an annual well check? The services and screenings  done by your health care provider during your annual well check will depend on your age, overall health, lifestyle risk factors, and family history of disease. Counseling  Your health care provider may ask you questions about your:  Alcohol use.  Tobacco use.  Drug use.  Emotional well-being.  Home and relationship well-being.  Sexual activity.  Eating habits.  History of falls.  Memory and ability to understand (cognition).  Work and work Statistician. Screening  You may have the following tests or measurements:  Height, weight, and BMI.  Blood pressure.  Lipid and cholesterol levels. These may be checked every 5 years, or more frequently if you are over 55 years old.  Skin check.  Lung cancer screening. You may have this screening every year starting at age 25 if you have a 30-pack-year history of smoking and currently smoke or have quit within the past 15 years.  Fecal occult blood test (FOBT) of the stool. You may have this test every year starting at age 12.  Flexible sigmoidoscopy or colonoscopy. You may have a sigmoidoscopy every 5 years or a colonoscopy every 10 years starting at age 3.  Prostate cancer screening. Recommendations will vary depending on your family history and other risks.  Hepatitis C blood test.  Hepatitis B blood test.  Sexually transmitted disease (STD) testing.  Diabetes screening. This is done by checking your blood sugar (glucose) after you have not eaten for a while (fasting). You may have this done every 1-3 years.  Abdominal aortic aneurysm (AAA) screening. You may need this if you are a current or former smoker.  Osteoporosis. You may be  screened starting at age 53 if you are at high risk. Talk with your health care provider about your test results, treatment options, and if necessary, the need for more tests. Vaccines  Your health care provider may recommend certain vaccines, such as:  Influenza vaccine. This is recommended  every year.  Tetanus, diphtheria, and acellular pertussis (Tdap, Td) vaccine. You may need a Td booster every 10 years.  Zoster vaccine. You may need this after age 22.  Pneumococcal 13-valent conjugate (PCV13) vaccine. One dose is recommended after age 61.  Pneumococcal polysaccharide (PPSV23) vaccine. One dose is recommended after age 50. Talk to your health care provider about which screenings and vaccines you need and how often you need them. This information is not intended to replace advice given to you by your health care provider. Make sure you discuss any questions you have with your health care provider. Document Released: 10/26/2015 Document Revised: 06/18/2016 Document Reviewed: 07/31/2015 Elsevier Interactive Patient Education  2017 [employer] Prevention in the Home Falls can cause injuries. They can happen to people of all ages. There are many things you can do to make your home safe and to help prevent falls. What can I do on the outside of my home?  Regularly fix the edges of walkways and driveways and fix any cracks.  Remove anything that might make you trip as you walk through a door, such as a raised step or threshold.  Trim any bushes or trees on the path to your home.  Use bright outdoor lighting.  Clear any walking paths of anything that might make someone trip, such as rocks or tools.  Regularly check to see if handrails are loose or broken. Make sure that both sides of any steps have handrails.  Any raised decks and porches should have guardrails on the edges.  Have any leaves, snow, or ice cleared regularly.  Use sand or salt on walking paths during winter.  Clean up any spills in your garage right away. This includes oil or grease spills. What can I do in the bathroom?  Use night lights.  Install grab bars by the toilet and in the tub and shower. Do not use towel bars as grab bars.  Use non-skid mats or decals in the tub or shower.  If  you need to sit down in the shower, use a plastic, non-slip stool.  Keep the floor dry. Clean up any water that spills on the floor as soon as it happens.  Remove soap buildup in the tub or shower regularly.  Attach bath mats securely with double-sided non-slip rug tape.  Do not have throw rugs and other things on the floor that can make you trip. What can I do in the bedroom?  Use night lights.  Make sure that you have a light by your bed that is easy to reach.  Do not use any sheets or blankets that are too big for your bed. They should not hang down onto the floor.  Have a firm chair that has side arms. You can use this for support while you get dressed.  Do not have throw rugs and other things on the floor that can make you trip. What can I do in the kitchen?  Clean up any spills right away.  Avoid walking on wet floors.  Keep items that you use a lot in easy-to-reach places.  If you need to reach something above you, use a strong step stool that has a  grab bar.  Keep electrical cords out of the way.  Do not use floor polish or wax that makes floors slippery. If you must use wax, use non-skid floor wax.  Do not have throw rugs and other things on the floor that can make you trip. What can I do with my stairs?  Do not leave any items on the stairs.  Make sure that there are handrails on both sides of the stairs and use them. Fix handrails that are broken or loose. Make sure that handrails are as long as the stairways.  Check any carpeting to make sure that it is firmly attached to the stairs. Fix any carpet that is loose or worn.  Avoid having throw rugs at the top or bottom of the stairs. If you do have throw rugs, attach them to the floor with carpet tape.  Make sure that you have a light switch at the top of the stairs and the bottom of the stairs. If you do not have them, ask someone to add them for you. What else can I do to help prevent falls?  Wear shoes  that:  Do not have high heels.  Have rubber bottoms.  Are comfortable and fit you well.  Are closed at the toe. Do not wear sandals.  If you use a stepladder:  Make sure that it is fully opened. Do not climb a closed stepladder.  Make sure that both sides of the stepladder are locked into place.  Ask someone to hold it for you, if possible.  Clearly mark and make sure that you can see:  Any grab bars or handrails.  First and last steps.  Where the edge of each step is.  Use tools that help you move around (mobility aids) if they are needed. These include:  Canes.  Walkers.  Scooters.  Crutches.  Turn on the lights when you go into a dark area. Replace any light bulbs as soon as they burn out.  Set up your furniture so you have a clear path. Avoid moving your furniture around.  If any of your floors are uneven, fix them.  If there are any pets around you, be aware of where they are.  Review your medicines with your doctor. Some medicines can make you feel dizzy. This can increase your chance of falling. Ask your doctor what other things that you can do to help prevent falls. This information is not intended to replace advice given to you by your health care provider. Make sure you discuss any questions you have with your health care provider. Document Released: 07/26/2009 Document Revised: 03/06/2016 Document Reviewed: 11/03/2014 Elsevier Interactive Patient Education  2017 [employer].

## 2020-06-12 NOTE — Progress Notes (Signed)
Subjective:   Gary Osborn is a 86 y.o. male who presents for Medicare Annual/Subsequent preventive examination.  I connected with Kerney Elbe today by telephone and verified that I am speaking with the correct person using two identifiers. Location patient: home Location provider: work Persons participating in the virtual visit: patient, provider.   I discussed the limitations, risks, security and privacy concerns of performing an evaluation and management service by telephone and the availability of in person appointments. I also discussed with the patient that there may be a patient responsible charge related to this service. The patient expressed understanding and verbally consented to this telephonic visit.    Interactive audio and video telecommunications were attempted between this provider and patient, however failed, due to patient having technical difficulties OR patient did not have access to video capability.  We continued and completed visit with audio only.      Review of Systems    N/A Cardiac Risk Factors include: advanced age (>55men, >31 women);male gender     Objective:    Today's Vitals   There is no height or weight on file to calculate BMI.  Advanced Directives 06/12/2020 09/08/2014 11/04/2013  Does Patient Have a Medical Advance Directive? Yes No Patient does not have advance directive;Patient would not like information  Type of Scientist, forensic Power of [place];Living will - -  Does patient want to make changes to medical advance directive? No - Patient declined - -  Copy of [place] in Chart? No - copy requested - -  Would patient like information on creating a medical advance directive? - No - patient declined information -  Pre-existing out of facility DNR order (yellow form or pink MOST form) - - No  Some encounter information is confidential and restricted. Go to Review Flowsheets activity to see all data.     Current Medications (verified) Outpatient Encounter Medications as of 06/12/2020  Medication Sig  . busPIRone (BUSPAR) 10 MG tablet Take 10 mg by mouth 2 (two) times daily. Take 2 tabs twice daily  . clonazePAM (KLONOPIN) 0.5 MG tablet Take 1 tablet (0.5 mg total) by mouth 2 (two) times daily as needed. For severe anxiety  . docusate sodium (COLACE) 100 MG capsule Take 100 mg by mouth daily.  . hydrocortisone (PROCTOSOL HC) 2.5 % rectal cream Place 1 application rectally 2 (two) times daily.  Marland Kitchen levothyroxine (SYNTHROID) 50 MCG tablet TAKE 1 TABLET ONCE DAILY BEFORE BREAKFAST.  . mirtazapine (REMERON) 45 MG tablet Take 0.5 tablets (22.5 mg total) by mouth at bedtime. For depression  . Multiple Vitamins-Minerals (CENTRUM SILVER PO) Take by mouth daily.  Marland Kitchen nystatin-triamcinolone ointment (MYCOLOG) Apply 1 application topically 2 (two) times daily.  Marland Kitchen PARoxetine (PAXIL) 40 MG tablet Take 1 tablet (40 mg total) by mouth at bedtime. For depression   No facility-administered encounter medications on file as of 06/12/2020.    Allergies (verified) Cats claw [uncaria tomentosa (cats claw)]   History: Past Medical History:  Diagnosis Date  . ALLERGIC RHINITIS 10/05/2007  . ANXIETY 10/05/2007  . COLONIC POLYPS, HX OF 10/05/2007  . DEPRESSION 10/04/2008  . PROSTATE CANCER, UNSPEC. 10/05/2007  . 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 NASAL SINUS SURGERY    . PROSTATE SURGERY    . singulotomy     for OCD  . TONSILLECTOMY     Family History  Problem Relation Age of Onset  . Prostate cancer Father   .  Breast cancer Sister    Social History   Socioeconomic History  . Marital status: Married    Spouse name: Not on file  . Number of children: 2  . Years of education: Not on file  . Highest education level: Not on file  Occupational History  . Occupation: Retired    Fish farm manager: RETIRED  Tobacco Use  . Smoking status: Former Smoker     Types: Cigarettes    Quit date: 10/13/1973    Years since quitting: 46.6  . Smokeless tobacco: Never Used  Substance and Sexual Activity  . Alcohol use: Yes    Alcohol/week: 28.0 standard drinks    Types: 14 Cans of beer, 14 Standard drinks or equivalent per week    Comment: glass of chardonnay each night  . Drug use: No  . Sexual activity: Not on file  Other Topics Concern  . Not on file  Social History Narrative  . Not on file   Social Determinants of Health   Financial Resource Strain: Low Risk   . Difficulty of Paying Living Expenses: Not hard at all  Food Insecurity: No Food Insecurity  . Worried About Charity fundraiser in the Last Year: Never true  . Ran Out of Food in the Last Year: Never true  Transportation Needs: No Transportation Needs  . Lack of Transportation (Medical): No  . Lack of Transportation (Non-Medical): No  Physical Activity: Inactive  . Days of Exercise per Week: 0 days  . Minutes of Exercise per Session: 0 min  Stress: No Stress Concern Present  . Feeling of Stress : Not at all  Social Connections: Moderately Isolated  . Frequency of Communication with Friends and Family: More than three times a week  . Frequency of Social Gatherings with Friends and Family: More than three times a week  . Attends Religious Services: Never  . Active Member of Clubs or Organizations: No  . Attends Archivist Meetings: Never  . Marital Status: Married    Tobacco Counseling Counseling given: Not Answered   Clinical Intake:  Pre-visit preparation completed: Yes  Pain : No/denies pain     Nutritional Risks: None Diabetes: No  How often do you need to have someone help you when you read instructions, pamphlets, or other written materials from your doctor or pharmacy?: 1 - Never What is the last grade level you completed in school?: College  Diabetic?No  Interpreter Needed?: No  Information entered by :: [place] of Daily  Living In your present state of health, do you have any difficulty performing the following activities: 06/12/2020  Hearing? N  Vision? N  Difficulty concentrating or making decisions? Y  Comment Forgets names at times  Walking or climbing stairs? N  Dressing or bathing? N  Doing errands, shopping? N  Preparing Food and eating ? N  Using the Toilet? N  In the past six months, have you accidently leaked urine? N  Do you have problems with loss of bowel control? N  Managing your Medications? N  Managing your Finances? N  Housekeeping or managing your Housekeeping? N  Some recent data might be hidden    Patient Care Team: Billie Ruddy, MD as PCP - General (Family Medicine) Norma Fredrickson, MD as Consulting Physician (Psychiatry)  Indicate any recent Medical Services you may have received from other than [facility] providers in the past year (date may be approximate).     Assessment:   This is a routine wellness  examination for Gary Osborn.  Hearing/Vision screen  Hearing Screening   125Hz  250Hz  500Hz  1000Hz  2000Hz  3000Hz  4000Hz  6000Hz  8000Hz   Right ear:           Left ear:           Vision Screening Comments: Patient states gets eyes checked annually   Dietary issues and exercise activities discussed: Current Exercise Habits: The patient does not participate in regular exercise at present  Goals    . DIET - INCREASE WATER INTAKE     Try to increase water to 6- 8 cups per day      Depression Screen PHQ 2/9 Scores 06/12/2020 06/04/2020 06/01/2019 06/01/2019 05/25/2018 05/21/2016 03/30/2015  PHQ - 2 Score 0 0 0 0 0 0 0  PHQ- 9 Score 0 - 0 0 - - -    Fall Risk Fall Risk  06/12/2020 06/04/2020 05/25/2018 05/21/2016 03/30/2015  Falls in the past year? 0 0 No No No  Number falls in past yr: 0 0 - - -  Injury with Fall? 0 0 - - -  Risk for fall due to : Medication side effect - - - -  Follow up Falls evaluation completed;Falls prevention discussed - - - -    Any stairs in or around the home?  Yes  If so, are there any without handrails? No  Home free of loose throw rugs in walkways, pet beds, electrical cords, etc? Yes  Adequate lighting in your home to reduce risk of falls? Yes   ASSISTIVE DEVICES UTILIZED TO PREVENT FALLS:  Life alert? No  Use of a cane, walker or w/c? No  Grab bars in the bathroom? Yes  Shower chair or bench in shower? Yes  Elevated toilet seat or a handicapped toilet? Yes     Cognitive Function:     6CIT Screen 06/12/2020  What Year? 0 points  What month? 0 points  What time? 0 points  Count back from 20 0 points  Months in reverse 0 points  Repeat phrase 0 points  Total Score 0    Immunizations Immunization History  Administered Date(s) Administered  . Fluad Quad(high Dose 65+) 07/09/2019  . Influenza Split 08/06/2011, 07/08/2012  . Influenza Whole 07/29/2007, 07/26/2008, 07/10/2009, 06/27/2010  . Influenza, High Dose Seasonal PF 07/06/2015, 06/30/2018  . Influenza,inj,Quad PF,6+ Mos 06/22/2013, 06/29/2014  . Influenza-Unspecified 07/04/2016, 07/22/2017  . Moderna SARS-COVID-2 Vaccination 10/17/2019, 11/14/2019  . Pneumococcal Conjugate-13 03/30/2015  . Pneumococcal Polysaccharide-23 07/14/2003  . Td 10/19/2009  . Zoster 12/13/2008    TDAP status: Due, Education has been provided regarding the importance of this vaccine. Advised may receive this vaccine at local pharmacy or Health Dept. Aware to provide a copy of the vaccination record if obtained from local pharmacy or Health Dept. Verbalized acceptance and understanding. Flu Vaccine status: Up to date Pneumococcal vaccine status: Up to date Covid-19 vaccine status: Completed vaccines  Qualifies for Shingles Vaccine? Yes   Zostavax completed Yes   Shingrix Completed?: No.    Education has been provided regarding the importance of this vaccine. Patient has been advised to call insurance company to determine out of pocket expense if they have not yet received this vaccine. Advised may  also receive vaccine at local pharmacy or Health Dept. Verbalized acceptance and understanding.  Screening Tests Health Maintenance  Topic Date Due  . TETANUS/TDAP  10/20/2019  . INFLUENZA VACCINE  05/13/2020  . COVID-19 Vaccine  Completed  . PNA vac Low Risk Adult  Completed  Health Maintenance  Health Maintenance Due  Topic Date Due  . TETANUS/TDAP  10/20/2019  . INFLUENZA VACCINE  05/13/2020    Colorectal cancer screening: No longer required.   Lung Cancer Screening: (Low Dose CT Chest recommended if Age 87-80 years, 30 pack-year currently smoking OR have quit w/in 15years.) does not qualify.   Lung Cancer Screening Referral: N/A  Additional Screening:  Hepatitis C Screening: does not qualify;   Vision Screening: Recommended annual ophthalmology exams for early detection of glaucoma and other disorders of the eye. Is the patient up to date with their annual eye exam?  Yes  Who is the provider or what is the name of the office in which the patient attends annual eye exams? [facility]  If pt is not established with a provider, would they like to be referred to a provider to establish care? No .   Dental Screening: Recommended annual dental exams for proper oral hygiene  Community Resource Referral / Chronic Care Management: CRR required this visit?  No   CCM required this visit?  No      Plan:     I have personally reviewed and noted the following in the patient's chart:   . Medical and social history . Use of alcohol, tobacco or illicit drugs  . Current medications and supplements . Functional ability and status . Nutritional status . Physical activity . Advanced directives . List of other physicians . Hospitalizations, surgeries, and ER visits in previous 12 months . Vitals . Screenings to include cognitive, depression, and falls . Referrals and appointments  In addition, I have reviewed and discussed with patient certain preventive  protocols, quality metrics, and best practice recommendations. A written personalized care plan for preventive services as well as general preventive health recommendations were provided to patient.     Ofilia Neas, LPN   10/27/7260   Nurse Notes: None

## 2020-06-12 NOTE — Telephone Encounter (Signed)
Spoke with pt advised that Dr Volanda Napoleon did not approve refill since having pains from using the Rx, Advised pt to call the office for any developing symptoms. Pt verbalized understanding

## 2020-07-10 ENCOUNTER — Other Ambulatory Visit: Payer: Self-pay

## 2020-07-11 ENCOUNTER — Ambulatory Visit (INDEPENDENT_AMBULATORY_CARE_PROVIDER_SITE_OTHER): Payer: Medicare Other | Admitting: Family Medicine

## 2020-07-11 ENCOUNTER — Encounter: Payer: Self-pay | Admitting: Family Medicine

## 2020-07-11 VITALS — BP 128/82 | HR 78 | Temp 98.5°F | Wt 233.2 lb

## 2020-07-11 DIAGNOSIS — B372 Candidiasis of skin and nail: Secondary | ICD-10-CM

## 2020-07-11 DIAGNOSIS — N62 Hypertrophy of breast: Secondary | ICD-10-CM | POA: Diagnosis not present

## 2020-07-11 DIAGNOSIS — E291 Testicular hypofunction: Secondary | ICD-10-CM | POA: Diagnosis not present

## 2020-07-11 DIAGNOSIS — Z8546 Personal history of malignant neoplasm of prostate: Secondary | ICD-10-CM | POA: Diagnosis not present

## 2020-07-11 LAB — POC URINALSYSI DIPSTICK (AUTOMATED)
Bilirubin, UA: NEGATIVE
Blood, UA: NEGATIVE
Glucose, UA: NEGATIVE
Ketones, UA: NEGATIVE
Leukocytes, UA: NEGATIVE
Nitrite, UA: NEGATIVE
Protein, UA: NEGATIVE
Spec Grav, UA: 1.025 (ref 1.010–1.025)
Urobilinogen, UA: 0.2 E.U./dL
pH, UA: 5.5 (ref 5.0–8.0)

## 2020-07-11 NOTE — Patient Instructions (Addendum)
Gynecomastia, Adult Gynecomastia is an overgrowth of gland tissue in a man's breasts. This may cause one or both breasts to become enlarged. The condition often develops in men who have an imbalance of the male sex hormone (testosterone) and the male sex hormone (estrogen). This means that a man may have too much estrogen, too little testosterone, or both. Gynecomastia may be a normal part of aging for some men. It can also happen to adolescent boys during puberty. What are the causes? This condition may be caused by:  Certain medicines, such as: ? Estrogen supplements and medicines that act like estrogen in the body. ? Medicines that keep testosterone from functioning normally in the body (testosterone-inhibiting drugs). ? Anabolic steroids. ? Medicines to treat heartburn, cancer, heart disease, mental health problems, HIV, or AIDS. ? Antibiotic medicine. ? Chemotherapy medicine.  Recreational drugs, including alcohol, marijuana, and opioids.  Herbal products, including lavender and tea tree oil.  A gene that is passed from parent to child (inherited).  Certain medical conditions, such as: ? Tumors in the pituitary or adrenal gland. ? An overactive thyroid gland. ? Certain inherited disorders, including a genetic disease that causes low testosterone in males (Klinefelter syndrome). ? Cancer of the lung, kidney, liver, testicle, or gastrointestinal tract. ? Conditions that cause liver or kidney failure. ? Poor nutrition and starvation. ? Testicle shrinking or failure (testicularatrophy). In some cases, the cause may not be known. What increases the risk? The following factors may make you more likely to develop this condition:  Being 50 years old or older.  Being overweight.  Abusing alcohol or other drugs.  Having a family history of gynecomastia. What are the signs or symptoms? In most cases, breast enlargement is the only symptom. The enlargement may start near the nipple,  and the breast tissue may feel firm and rubbery. Other symptoms may include:  Pain or tenderness in the breasts.  Itchy breasts. How is this diagnosed? This condition may be diagnosed based on:  Your symptoms and medical history.  A physical exam.  Imaging tests, such as: ? An ultrasound. ? A mammogram. ? An MRI.  Blood tests.  Removal of a sample of breast tissue to be tested in a lab (biopsy). How is this treated? This condition may go away on its own, without treatment. If gynecomastia is caused by a medical problem or drug abuse, treatment may include:  Getting treatment for the underlying medical problem or for drug abuse.  Changing or stopping medicines.  Medicines to block the effects of estrogen.  Taking a testosterone replacement.  Surgery to remove breast tissue or any lumps in your breasts.  Breast reduction surgery. This may be an option if you have severe or painful gynecomastia. Follow these instructions at home:   Take over-the-counter and prescription medicines only as told by your health care provider.  Talk to your health care provider before taking any herbal medicines or diet supplements.  Do not abuse drugs or alcohol.  Keep all follow-up visits as told by your health care provider. This is important. Contact a health care provider if:  Your breast tissue grows larger or gets more swollen or painful.  You have a lump in your testicle.  You have blood or discharge coming from your nipples.  Your nipple changes shape.  You develop a hard or painful lump in your breast. Summary  Gynecomastia is an overgrowth of gland tissue in a man's breasts. This may cause one or both breasts to become   enlarged.  In most cases, breast enlargement is the only symptom. The enlargement may start near the nipple, and the breast tissue may feel firm and rubbery.  This condition may go away on its own, without treatment. In some cases, treatment for an  underlying medical problem or for drug abuse may be needed.  Take over-the-counter and prescription medicines only as told by your health care provider.  Do not abuse drugs or alcohol. This information is not intended to replace advice given to you by your health care provider. Make sure you discuss any questions you have with your health care provider. Document Revised: 03/24/2019 Document Reviewed: 03/24/2019 Elsevier Patient Education  [place].  Testosterone Test Why am I having this test? Testosterone is a hormone made by the adrenal glands in the abdomen in both males and females. In males, it is also made by the testicles. Starting at puberty, testosterone stimulates the development of secondary sex characteristics in males. This includes a deeper voice, muscle and body hair growth, and penis enlargement. In females, testosterone is also produced in the ovaries. The male body converts testosterone into estradiol, the main male sex hormone. An abnormal level of testosterone can cause health issues in both males and females. You may have this test if your health care provider suspects that an abnormal testosterone level is causing or contributing to other health problems. In males, an abnormally low testosterone level can cause:  Inability to have children (infertility).  Trouble getting or maintaining an erection (erectile dysfunction).  Delayed puberty. In females, an abnormally high testosterone level can cause:  Infertility.  Polycystic ovary syndrome (PCOS).  Development of masculine features (virilization). What is being tested? This test measures the amount of total testosterone in your blood. What kind of sample is taken?  A blood sample is required for this test. It is usually collected by inserting a needle into a blood vessel. The sample is most often collected in the morning because that is when testosterone is usually the highest. Tell a health care  provider about:  Any allergies you have.  All medicines you are taking, including vitamins, herbs, eye drops, creams, and over-the-counter medicines.  Any blood disorders you have.  Any surgeries you have had.  Any medical conditions you have.  Whether you are pregnant or may be pregnant. How are the results reported? Your test results will be reported as a value that indicates how much testosterone is in your blood. This will be given as nanograms of testosterone per deciliter of blood (ng/dL). Your health care provider will compare your test results to normal ranges that were established after testing a large group of people (reference ranges). Reference ranges may vary among labs and hospitals. For this test, common reference ranges for total testosterone are:  Male: ? 7 months to 84 years old: less than 30 ng/dL. ? 81-74 years old: less than 300 ng/dL. ? 35-62 years old: 170-540 ng/dL. ? 64-51 years old: 250-910 ng/dL. ? 20 years and older: 280-1,080 ng/dL.  Male: ? 7 months to 85 years old: less than 30 ng/dL. ? 37-27 years old: less than 40 ng/dL. ? 2-46 years old: less than 60 ng/dL. ? 66-26 years old: less than 70 ng/dL. ? 20 years and older: less than 70 ng/dL. What do the results mean? A result that is within your reference range means that you have a normal amount of testosterone in your blood. In males:  A high testosterone level may mean that  you: ? Have certain types of tumors. ? Have an overactive thyroid gland (hyperthyroidism). ? Currently use anabolic steroids or used anabolic steroids in the past. ? Have an inherited disorder that affects the adrenal glands (congenital adrenal hyperplasia). ? Are starting puberty early (precocious puberty).  A low testosterone level may mean that you: ? Have certain genetic diseases. ? Have had certain viral infections, such as mumps. ? Have a condition that affects the pituitary gland. ? Have injured your  testicles. In females:  A high testosterone level may mean that you have: ? Certain types of tumors, such as ovarian or adrenal gland tumors. ? An inherited disorder that affects certain cells in the adrenal glands (congenital adrenal hyperplasia). ? Polycystic ovary syndrome.  A low testosterone level usually will not cause health problems. Talk with your health care provider about what your results mean. Questions to ask your health care provider Ask your health care provider, or the department that is doing the test:  When will my results be ready?  How will I get my results?  What are my treatment options?  What other tests do I need?  What are my next steps? Summary  Testosterone is a hormone made by the adrenal glands in the abdomen in both males and females. In males, it is also made by the testicles. Starting at puberty, testosterone stimulates the development of secondary sex characteristics in males.  In females, testosterone is also produced in the ovaries. The male body converts testosterone into estradiol, the main male sex hormone.  An abnormal level of testosterone can cause health issues in both males and females. You may have this test if your health care provider suspects that an abnormal testosterone level is causing or contributing to other health problems. This information is not intended to replace advice given to you by your health care provider. Make sure you discuss any questions you have with your health care provider. Document Revised: 03/17/2019 Document Reviewed: 06/30/2017 Elsevier Patient Education  [place].  Skin Yeast Infection  A skin yeast infection is a condition in which there is an overgrowth of yeast (candida) that normally lives on the skin. This condition usually occurs in areas of the skin that are constantly warm and moist, such as the armpits or the groin. What are the causes? This condition is caused by a change in the  normal balance of the yeast and bacteria that live on the skin. What increases the risk? You are more likely to develop this condition if you:  Are obese.  Are pregnant.  Take birth control pills.  Have diabetes.  Take antibiotic medicines.  Take steroid medicines.  Are malnourished.  Have a weak body defense system (immune system).  Are 37 years of age or older.  Wear tight clothing. What are the signs or symptoms? The most common symptom of this condition is itchiness in the affected area. Other symptoms include:  Red, swollen area of the skin.  Bumps on the skin. How is this diagnosed?  This condition is diagnosed with a medical history and physical exam.  Your health care provider may check for yeast by taking light scrapings of the skin to be viewed under a microscope. How is this treated? This condition is treated with medicine. Medicines may be prescribed or be available over the counter. The medicines may be:  Taken by mouth (orally).  Applied as a cream or powder to your skin. Follow these instructions at home:   Take  or apply over-the-counter and prescription medicines only as told by your health care provider.  Maintain a healthy weight. If you need help losing weight, talk with your health care provider.  Keep your skin clean and dry.  If you have diabetes, keep your blood sugar under control.  Keep all follow-up visits as told by your health care provider. This is important. Contact a health care provider if:  Your symptoms go away and then return.  Your symptoms do not get better with treatment.  Your symptoms get worse.  Your rash spreads.  You have a fever or chills.  You have new symptoms.  You have new warmth or redness of your skin. Summary  A skin yeast infection is a condition in which there is an overgrowth of yeast (candida) that normally lives on the skin. This condition is caused by a change in the normal balance of the  yeast and bacteria that live on the skin.  Take or apply over-the-counter and prescription medicines only as told by your health care provider.  Keep your skin clean and dry.  Contact a health care provider if your symptoms do not get better with treatment. This information is not intended to replace advice given to you by your health care provider. Make sure you discuss any questions you have with your health care provider. Document Revised: 02/16/2018 Document Reviewed: 02/16/2018 Elsevier Patient Education  [place].

## 2020-07-11 NOTE — Progress Notes (Signed)
Subjective:    Patient ID: Gary Osborn, male    DOB: 10/19/1932, 86 y.o.   MRN: [id number]  No chief complaint on file.   HPI Pt is an 86 yo male with pmh sig for bradycardia, anxiety, MDD, h/o prostate cancer s/p resection who was seen today for f/u on lab results.  Pt was informed of lab results but was not sure what it all meant.  Pt states he would like something to help with gynecomastia.  Currently followed by psychchiatry.  On buspar, remeron, klonopin, paxil.  Pt also notes having to wear adult briefs 2/2 urine leakage s/p prostate resection.  Pt endorses occasional skin irritation of penis. May use an ointment.  Notes improvement in hemorrhoids since last OFV, 8/23.    Past Medical History:  Diagnosis Date  . ALLERGIC RHINITIS 10/05/2007  . ANXIETY 10/05/2007  . COLONIC POLYPS, HX OF 10/05/2007  . DEPRESSION 10/04/2008  . PROSTATE CANCER, UNSPEC. 10/05/2007  . SINUS BRADYCARDIA 10/04/2008    Allergies  Allergen Reactions  . Cats Claw [Uncaria Tomentosa (Cats Claw)]     PT IS ALLERGIC TO CATS/ DOES NOT HAVE ANY    ROS General: Denies fever, chills, night sweats, changes in weight, changes in appetite  +gynecomastia HEENT: Denies headaches, ear pain, changes in vision, rhinorrhea, sore throat CV: Denies CP, palpitations, SOB, orthopnea Pulm: Denies SOB, cough, wheezing GI: Denies abdominal pain, nausea, vomiting, diarrhea, constipation GU: Denies dysuria, hematuria, frequency Msk: Denies muscle cramps, joint pains Neuro: Denies weakness, numbness, tingling Skin: Denies rashes, bruising  +skin irritation of penis Psych: Denies depression, anxiety, hallucinations     Objective:    Blood pressure 128/82, pulse 78, temperature 98.5 F (36.9 C), temperature source Oral, weight 233 lb 3.2 oz (105.8 kg), SpO2 96 %.  Gen. Pleasant, well-nourished, in no distress, normal affect   HEENT: [place]/AT, face symmetric, conjunctiva clear, no scleral icterus, PERRLA, EOMI, nares patent  without drainage Lungs: no accessory muscle use, CTAB, no wheezes or rales Cardiovascular: RRR, no m/r/g, no peripheral edema Musculoskeletal: Gynecomastia.  No cyanosis or clubbing, normal tone Neuro:  A&Ox3, CN II-XII intact, normal gait Skin:  Warm, dry, intact.  Rectum without hemorrhoids or skin tags.  Mild erythema and satellite lesions noted in gluteal cleft.  Mild erythema and satellite lesions of glans penis.   Wt Readings from Last 3 Encounters:  07/11/20 233 lb 3.2 oz (105.8 kg)  06/04/20 231 lb (104.8 kg)  06/01/19 212 lb (96.2 kg)    Lab Results  Component Value Date   WBC 3.2 (L) 06/04/2020   HGB 14.2 06/04/2020   HCT 41.7 06/04/2020   PLT 165 06/04/2020   GLUCOSE 109 (H) 06/04/2020   CHOL 143 06/04/2020   TRIG 90 06/04/2020   HDL 53 06/04/2020   LDLCALC 73 06/04/2020   ALT 28 06/04/2020   AST 19 06/04/2020   NA 139 06/04/2020   K 4.5 06/04/2020   CL 106 06/04/2020   CREATININE 1.01 06/04/2020   BUN 22 06/04/2020   CO2 25 06/04/2020   TSH 2.24 06/04/2020   PSA 0.00 (L) 06/01/2019   HGBA1C 5.2 09/16/2014    Assessment/Plan:  Primary male hypogonadism  -reviewed labs form 06/04/20 LH 29, testosterone 14, TSH 2.24 -given hx of prostate cancer s/p resection discussed f/u with Urology for additional recs as pt is interested in testosterone replacement. - Plan: Ambulatory referral to Urology  History of prostate cancer  - Plan: Ambulatory referral to Urology  Gynecomastia  -  Discussed possible causes including medications, testosterone deficiency, other hormone abnormalities -LH 29, testosterone 14, and TSH 2.24 on 06/04/20 - Plan: Ambulatory referral to Urology  Yeast dermatitis  -Discussed treatment with nystatin.  Patient declines at this time.   -advised to f/u for continued or worsened symptoms - Plan: POCT Urinalysis Dipstick (Automated)  F/u prn  Grier Mitts, MD

## 2020-07-15 ENCOUNTER — Encounter: Payer: Self-pay | Admitting: Family Medicine

## 2020-07-15 DIAGNOSIS — E291 Testicular hypofunction: Secondary | ICD-10-CM | POA: Insufficient documentation

## 2020-07-15 DIAGNOSIS — Z8546 Personal history of malignant neoplasm of prostate: Secondary | ICD-10-CM | POA: Insufficient documentation

## 2020-07-15 DIAGNOSIS — N62 Hypertrophy of breast: Secondary | ICD-10-CM | POA: Insufficient documentation

## 2020-07-16 ENCOUNTER — Encounter: Payer: Self-pay | Admitting: Family Medicine

## 2020-07-25 DIAGNOSIS — Z23 Encounter for immunization: Secondary | ICD-10-CM | POA: Diagnosis not present

## 2020-07-31 DIAGNOSIS — F251 Schizoaffective disorder, depressive type: Secondary | ICD-10-CM | POA: Diagnosis not present

## 2020-08-02 ENCOUNTER — Other Ambulatory Visit: Payer: Self-pay | Admitting: Family Medicine

## 2020-08-02 DIAGNOSIS — E039 Hypothyroidism, unspecified: Secondary | ICD-10-CM

## 2020-08-07 DIAGNOSIS — C61 Malignant neoplasm of prostate: Secondary | ICD-10-CM | POA: Diagnosis not present

## 2020-08-07 DIAGNOSIS — E291 Testicular hypofunction: Secondary | ICD-10-CM | POA: Diagnosis not present

## 2020-08-10 DIAGNOSIS — E291 Testicular hypofunction: Secondary | ICD-10-CM | POA: Diagnosis not present

## 2020-08-10 DIAGNOSIS — C61 Malignant neoplasm of prostate: Secondary | ICD-10-CM | POA: Diagnosis not present

## 2020-08-13 DIAGNOSIS — H401431 Capsular glaucoma with pseudoexfoliation of lens, bilateral, mild stage: Secondary | ICD-10-CM | POA: Diagnosis not present

## 2020-09-25 DIAGNOSIS — D225 Melanocytic nevi of trunk: Secondary | ICD-10-CM | POA: Diagnosis not present

## 2020-09-25 DIAGNOSIS — L814 Other melanin hyperpigmentation: Secondary | ICD-10-CM | POA: Diagnosis not present

## 2020-09-25 DIAGNOSIS — L304 Erythema intertrigo: Secondary | ICD-10-CM | POA: Diagnosis not present

## 2020-09-25 DIAGNOSIS — H401431 Capsular glaucoma with pseudoexfoliation of lens, bilateral, mild stage: Secondary | ICD-10-CM | POA: Diagnosis not present

## 2020-09-25 DIAGNOSIS — L821 Other seborrheic keratosis: Secondary | ICD-10-CM | POA: Diagnosis not present

## 2020-09-25 DIAGNOSIS — D1801 Hemangioma of skin and subcutaneous tissue: Secondary | ICD-10-CM | POA: Diagnosis not present

## 2020-09-25 DIAGNOSIS — L570 Actinic keratosis: Secondary | ICD-10-CM | POA: Diagnosis not present

## 2020-10-04 ENCOUNTER — Other Ambulatory Visit: Payer: Self-pay | Admitting: Family Medicine

## 2020-10-04 DIAGNOSIS — E039 Hypothyroidism, unspecified: Secondary | ICD-10-CM

## 2020-10-26 ENCOUNTER — Other Ambulatory Visit: Payer: Self-pay

## 2020-10-30 ENCOUNTER — Ambulatory Visit: Payer: Medicare Other | Admitting: Endocrinology

## 2020-11-06 ENCOUNTER — Other Ambulatory Visit: Payer: Self-pay | Admitting: Family Medicine

## 2020-11-06 DIAGNOSIS — E039 Hypothyroidism, unspecified: Secondary | ICD-10-CM

## 2020-11-20 DIAGNOSIS — L570 Actinic keratosis: Secondary | ICD-10-CM | POA: Diagnosis not present

## 2020-11-20 DIAGNOSIS — L859 Epidermal thickening, unspecified: Secondary | ICD-10-CM | POA: Diagnosis not present

## 2020-11-20 DIAGNOSIS — L304 Erythema intertrigo: Secondary | ICD-10-CM | POA: Diagnosis not present

## 2020-11-20 DIAGNOSIS — L821 Other seborrheic keratosis: Secondary | ICD-10-CM | POA: Diagnosis not present

## 2020-11-20 DIAGNOSIS — D1801 Hemangioma of skin and subcutaneous tissue: Secondary | ICD-10-CM | POA: Diagnosis not present

## 2020-11-20 DIAGNOSIS — L219 Seborrheic dermatitis, unspecified: Secondary | ICD-10-CM | POA: Diagnosis not present

## 2020-11-20 DIAGNOSIS — L814 Other melanin hyperpigmentation: Secondary | ICD-10-CM | POA: Diagnosis not present

## 2020-11-28 DIAGNOSIS — F251 Schizoaffective disorder, depressive type: Secondary | ICD-10-CM | POA: Diagnosis not present

## 2020-12-11 ENCOUNTER — Other Ambulatory Visit: Payer: Self-pay

## 2020-12-13 ENCOUNTER — Ambulatory Visit (INDEPENDENT_AMBULATORY_CARE_PROVIDER_SITE_OTHER): Payer: Medicare Other | Admitting: Endocrinology

## 2020-12-13 ENCOUNTER — Other Ambulatory Visit: Payer: Self-pay

## 2020-12-13 VITALS — BP 140/84 | HR 87 | Ht 69.0 in | Wt 235.8 lb

## 2020-12-13 DIAGNOSIS — E291 Testicular hypofunction: Secondary | ICD-10-CM

## 2020-12-13 DIAGNOSIS — N62 Hypertrophy of breast: Secondary | ICD-10-CM

## 2020-12-13 MED ORDER — TAMOXIFEN CITRATE 10 MG PO TABS: 10.0000 mg | ORAL_TABLET | Freq: Every day | ORAL | 3 refills | Status: DC

## 2020-12-13 NOTE — Progress Notes (Signed)
Subjective:    Patient ID: Gary Osborn, male    DOB: 1932/11/02, 87 y.o.   MRN: [id number]  HPI Pt is referred by Azucena Fallen, NP, for low testosterone.  He had 2 biological children.  He says he has never taken illicit androgens. He has never been on any prescribed medication for hypogonadism.  He does not take antiandrogens or opioids.  He denies any h/o infertility, XRT, or genital infection.  He has never had surgery, or a serious injury to the genital area.  He has no h/o sleep apnea or DVT.   He does not consume alcohol excessively.  He had surgical reduction of prominent nipples in approx 1980.  He reports recent increase in bilat breast growth.   Past Medical History:  Diagnosis Date  . ALLERGIC RHINITIS 10/05/2007  . ANXIETY 10/05/2007  . COLONIC POLYPS, HX OF 10/05/2007  . DEPRESSION 10/04/2008  . PROSTATE CANCER, UNSPEC. 10/05/2007  . 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 NASAL SINUS SURGERY    . PROSTATE SURGERY    . singulotomy     for OCD  . TONSILLECTOMY      Social History   Socioeconomic History  . Marital status: Married    Spouse name: Not on file  . Number of children: 2  . Years of education: Not on file  . Highest education level: Not on file  Occupational History  . Occupation: Retired    Fish farm manager: RETIRED  Tobacco Use  . Smoking status: Former Smoker    Types: Cigarettes    Quit date: 10/13/1973    Years since quitting: 47.2  . Smokeless tobacco: Never Used  Substance and Sexual Activity  . Alcohol use: Yes    Alcohol/week: 28.0 standard drinks    Types: 14 Cans of beer, 14 Standard drinks or equivalent per week    Comment: glass of chardonnay each night  . Drug use: No  . Sexual activity: Not on file  Other Topics Concern  . Not on file  Social History Narrative  . Not on file   Social Determinants of Health   Financial Resource Strain: Low Risk   . Difficulty of Paying Living Expenses:  Not hard at all  Food Insecurity: No Food Insecurity  . Worried About Charity fundraiser in the Last Year: Never true  . Ran Out of Food in the Last Year: Never true  Transportation Needs: No Transportation Needs  . Lack of Transportation (Medical): No  . Lack of Transportation (Non-Medical): No  Physical Activity: Inactive  . Days of Exercise per Week: 0 days  . Minutes of Exercise per Session: 0 min  Stress: No Stress Concern Present  . Feeling of Stress : Not at all  Social Connections: Moderately Isolated  . Frequency of Communication with Friends and Family: More than three times a week  . Frequency of Social Gatherings with Friends and Family: More than three times a week  . Attends Religious Services: Never  . Active Member of Clubs or Organizations: No  . Attends Archivist Meetings: Never  . Marital Status: Married  Human resources officer Violence: Not At Risk  . Fear of Current or Ex-Partner: No  . Emotionally Abused: No  . Physically Abused: No  . Sexually Abused: No    Current Outpatient Medications on File Prior to Visit  Medication Sig Dispense Refill  . busPIRone (BUSPAR) 10 MG tablet Take 10 mg by  mouth 2 (two) times daily. Take 2 tabs twice daily    . clonazePAM (KLONOPIN) 0.5 MG tablet Take 1 tablet (0.5 mg total) by mouth 2 (two) times daily as needed. For severe anxiety 8 tablet 0  . docusate sodium (COLACE) 100 MG capsule Take 100 mg by mouth daily.    . hydrocortisone (PROCTOSOL HC) 2.5 % rectal cream Place 1 application rectally 2 (two) times daily. 30 g 0  . levothyroxine (SYNTHROID) 50 MCG tablet TAKE 1 TABLET ONCE DAILY BEFORE BREAKFAST. 30 tablet 5  . mirtazapine (REMERON) 45 MG tablet Take 0.5 tablets (22.5 mg total) by mouth at bedtime. For depression 30 tablet 0  . Multiple Vitamins-Minerals (CENTRUM SILVER PO) Take by mouth daily.    Marland Kitchen nystatin-triamcinolone ointment (MYCOLOG) Apply 1 application topically 2 (two) times daily. 30 g 0  .  PARoxetine (PAXIL) 40 MG tablet Take 1 tablet (40 mg total) by mouth at bedtime. For depression 30 tablet 0   No current facility-administered medications on file prior to visit.    Allergies  Allergen Reactions  . Cats Claw [Uncaria Tomentosa (Cats Claw)]     PT IS ALLERGIC TO CATS/ DOES NOT HAVE ANY    Family History  Problem Relation Age of Onset  . Prostate cancer Father   . Breast cancer Sister     BP 140/84 (BP Location: Right Arm, Patient Position: Sitting, Cuff Size: Large)   Pulse 87   Ht 5\' 9"  (1.753 m)   Wt 235 lb 12.8 oz (107 kg)   SpO2 95%   BMI 34.82 kg/m    Review of Systems denies weight change, headache, and sob.       Objective:   Physical Exam VS: see vs page GEN: no distress HEAD: head: no deformity eyes: no periorbital swelling, no proptosis external nose and ears are normal NECK: supple, thyroid is not enlarged CHEST WALL: no deformity BREASTS: bilat pseudogynecomastia.   LUNGS: clear to auscultation CV: reg rate and rhythm, no murmur.  GENITALIA: Normal male testicles, scrotum, and penis.   MUSCULOSKELETAL: gait is normal and steady EXTEMITIES: no deformity.  no leg edema NEURO:  readily moves all 4's.  sensation is intact to touch on all 4's.  bilat pill rolling of the hands.  SKIN:  Normal texture and temperature.  No rash or suspicious lesion is visible.  Normal male hair distribution.  NODES:  None palpable at the neck PSYCH: alert, well-oriented.  Does not appear anxious nor depressed.  Lab Results  Component Value Date   TSH 2.24 06/04/2020   T3TOTAL 71.7 (L) 09/16/2014   outside test results are reviewed: FSH=120 LH=23 Prolactin=8 PSA is undetectable Testosterone=15  I have reviewed outside records, and summarized: Pt was noted to have low testosterone, and referred here.  He was dx'ed with prost cancer in 2008.  He was rx'ed with prostatectomy only.       Assessment & Plan:  Primary hypogonadism.  If this was not caused  by rx for prostate cancer, that would be a coincidence Gynecomastia: this is the only symptom if concern to the pt, so we'll address this.   Patient Instructions  Blood tests are requested for you today.  We'll let you know about the results.  Based on the results, I hope to be able to prescribe a pill to reduce the breast swelling.   Losing weight also helps reduce the breast swelling.    Please come back for a follow-up appointment in 2 months.

## 2020-12-13 NOTE — Patient Instructions (Addendum)
Blood tests are requested for you today.  We'll let you know about the results.  Based on the results, I hope to be able to prescribe a pill to reduce the breast swelling.   Losing weight also helps reduce the breast swelling.    Please come back for a follow-up appointment in 2 months.

## 2020-12-14 LAB — PROLACTIN: Prolactin: 10.8 ng/mL (ref 2.0–18.0)

## 2020-12-20 ENCOUNTER — Other Ambulatory Visit: Payer: Self-pay | Admitting: Endocrinology

## 2020-12-20 LAB — ESTRADIOL, FREE
Estradiol, Free: <0.03 pg/mL
Estradiol: <2 pg/mL (ref <=29)

## 2020-12-20 LAB — HCG, SERUM, QUALITATIVE: Preg, Serum: NEGATIVE

## 2020-12-25 DIAGNOSIS — H401431 Capsular glaucoma with pseudoexfoliation of lens, bilateral, mild stage: Secondary | ICD-10-CM | POA: Diagnosis not present

## 2021-01-28 DIAGNOSIS — L304 Erythema intertrigo: Secondary | ICD-10-CM | POA: Diagnosis not present

## 2021-01-28 DIAGNOSIS — L821 Other seborrheic keratosis: Secondary | ICD-10-CM | POA: Diagnosis not present

## 2021-01-28 DIAGNOSIS — L820 Inflamed seborrheic keratosis: Secondary | ICD-10-CM | POA: Diagnosis not present

## 2021-01-28 DIAGNOSIS — L570 Actinic keratosis: Secondary | ICD-10-CM | POA: Diagnosis not present

## 2021-02-13 ENCOUNTER — Ambulatory Visit (INDEPENDENT_AMBULATORY_CARE_PROVIDER_SITE_OTHER): Payer: Medicare Other | Admitting: Endocrinology

## 2021-02-13 ENCOUNTER — Other Ambulatory Visit: Payer: Self-pay

## 2021-02-13 VITALS — BP 134/78 | HR 83 | Ht 69.0 in | Wt 234.8 lb

## 2021-02-13 DIAGNOSIS — N62 Hypertrophy of breast: Secondary | ICD-10-CM | POA: Diagnosis not present

## 2021-02-13 NOTE — Patient Instructions (Addendum)
Please continue the same Tamoxifen.    Losing weight also helps reduce the breast swelling.    Please come back for a follow-up appointment in 6 months.

## 2021-02-13 NOTE — Progress Notes (Signed)
Subjective:    Patient ID: Gary Osborn, male    DOB: 06/07/1933, 87 y.o.   MRN: [id number]  HPI Pt returns for f/u of low testosterone (he had 2 biological children; he had surgical reduction of prominent nipples in approx 1980; he was rx'ed tamoxifen for bilat breast growth; he can't have rx for testosterone, due to h/o prostate ca).  Pt says breasts are smaller since on tamoxifen.   Past Medical History:  Diagnosis Date  . ALLERGIC RHINITIS 10/05/2007  . ANXIETY 10/05/2007  . COLONIC POLYPS, HX OF 10/05/2007  . DEPRESSION 10/04/2008  . PROSTATE CANCER, UNSPEC. 10/05/2007  . 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 NASAL SINUS SURGERY    . PROSTATE SURGERY    . singulotomy     for OCD  . TONSILLECTOMY      Social History   Socioeconomic History  . Marital status: Married    Spouse name: Not on file  . Number of children: 2  . Years of education: Not on file  . Highest education level: Not on file  Occupational History  . Occupation: Retired    Fish farm manager: RETIRED  Tobacco Use  . Smoking status: Former Smoker    Types: Cigarettes    Quit date: 10/13/1973    Years since quitting: 47.3  . Smokeless tobacco: Never Used  Substance and Sexual Activity  . Alcohol use: Yes    Alcohol/week: 28.0 standard drinks    Types: 14 Cans of beer, 14 Standard drinks or equivalent per week    Comment: glass of chardonnay each night  . Drug use: No  . Sexual activity: Not on file  Other Topics Concern  . Not on file  Social History Narrative  . Not on file   Social Determinants of Health   Financial Resource Strain: Low Risk   . Difficulty of Paying Living Expenses: Not hard at all  Food Insecurity: No Food Insecurity  . Worried About Charity fundraiser in the Last Year: Never true  . Ran Out of Food in the Last Year: Never true  Transportation Needs: No Transportation Needs  . Lack of Transportation (Medical): No  . Lack of  Transportation (Non-Medical): No  Physical Activity: Inactive  . Days of Exercise per Week: 0 days  . Minutes of Exercise per Session: 0 min  Stress: No Stress Concern Present  . Feeling of Stress : Not at all  Social Connections: Moderately Isolated  . Frequency of Communication with Friends and Family: More than three times a week  . Frequency of Social Gatherings with Friends and Family: More than three times a week  . Attends Religious Services: Never  . Active Member of Clubs or Organizations: No  . Attends Archivist Meetings: Never  . Marital Status: Married  Human resources officer Violence: Not At Risk  . Fear of Current or Ex-Partner: No  . Emotionally Abused: No  . Physically Abused: No  . Sexually Abused: No    Current Outpatient Medications on File Prior to Visit  Medication Sig Dispense Refill  . busPIRone (BUSPAR) 10 MG tablet Take 10 mg by mouth 2 (two) times daily. Take 2 tabs twice daily    . clonazePAM (KLONOPIN) 0.5 MG tablet Take 1 tablet (0.5 mg total) by mouth 2 (two) times daily as needed. For severe anxiety 8 tablet 0  . docusate sodium (COLACE) 100 MG capsule Take 100 mg by mouth daily.    Marland Kitchen  hydrocortisone (PROCTOSOL HC) 2.5 % rectal cream Place 1 application rectally 2 (two) times daily. 30 g 0  . levothyroxine (SYNTHROID) 50 MCG tablet TAKE 1 TABLET ONCE DAILY BEFORE BREAKFAST. 30 tablet 5  . mirtazapine (REMERON) 45 MG tablet Take 0.5 tablets (22.5 mg total) by mouth at bedtime. For depression 30 tablet 0  . Multiple Vitamins-Minerals (CENTRUM SILVER PO) Take by mouth daily.    Marland Kitchen nystatin-triamcinolone ointment (MYCOLOG) Apply 1 application topically 2 (two) times daily. 30 g 0  . PARoxetine (PAXIL) 40 MG tablet Take 1 tablet (40 mg total) by mouth at bedtime. For depression 30 tablet 0  . tamoxifen (NOLVADEX) 10 MG tablet Take 1 tablet (10 mg total) by mouth daily. 90 tablet 3   No current facility-administered medications on file prior to visit.     Allergies  Allergen Reactions  . Cats Claw [Uncaria Tomentosa (Cats Claw)]     PT IS ALLERGIC TO CATS/ DOES NOT HAVE ANY    Family History  Problem Relation Age of Onset  . Prostate cancer Father   . Breast cancer Sister     BP 134/78 (BP Location: Right Arm, Patient Position: Sitting, Cuff Size: Large)   Pulse 83   Ht 5\' 9"  (1.753 m)   Wt 234 lb 12.8 oz (106.5 kg)   SpO2 94%   BMI 34.67 kg/m    Review of Systems     Objective:   Physical Exam VITAL SIGNS:  See vs page GENERAL: no distress BREASTS: moderate bilat pseudogynecomastia, but no palpable ductal tissue.      Assessment & Plan:  Gynecomastia: improved on rx  Patient Instructions  Please continue the same Tamoxifen.    Losing weight also helps reduce the breast swelling.    Please come back for a follow-up appointment in 6 months.

## 2021-02-19 DIAGNOSIS — H401431 Capsular glaucoma with pseudoexfoliation of lens, bilateral, mild stage: Secondary | ICD-10-CM | POA: Diagnosis not present

## 2021-02-26 DIAGNOSIS — F251 Schizoaffective disorder, depressive type: Secondary | ICD-10-CM | POA: Diagnosis not present

## 2021-03-12 DIAGNOSIS — Z23 Encounter for immunization: Secondary | ICD-10-CM | POA: Diagnosis not present

## 2021-03-21 DIAGNOSIS — L7211 Pilar cyst: Secondary | ICD-10-CM | POA: Diagnosis not present

## 2021-03-21 DIAGNOSIS — D225 Melanocytic nevi of trunk: Secondary | ICD-10-CM | POA: Diagnosis not present

## 2021-03-21 DIAGNOSIS — D1801 Hemangioma of skin and subcutaneous tissue: Secondary | ICD-10-CM | POA: Diagnosis not present

## 2021-03-21 DIAGNOSIS — L219 Seborrheic dermatitis, unspecified: Secondary | ICD-10-CM | POA: Diagnosis not present

## 2021-03-21 DIAGNOSIS — L821 Other seborrheic keratosis: Secondary | ICD-10-CM | POA: Diagnosis not present

## 2021-03-21 DIAGNOSIS — L820 Inflamed seborrheic keratosis: Secondary | ICD-10-CM | POA: Diagnosis not present

## 2021-03-21 DIAGNOSIS — L570 Actinic keratosis: Secondary | ICD-10-CM | POA: Diagnosis not present

## 2021-03-21 DIAGNOSIS — L304 Erythema intertrigo: Secondary | ICD-10-CM | POA: Diagnosis not present

## 2021-04-16 ENCOUNTER — Ambulatory Visit: Payer: Medicare Other | Admitting: Endocrinology

## 2021-04-23 DIAGNOSIS — L570 Actinic keratosis: Secondary | ICD-10-CM | POA: Diagnosis not present

## 2021-04-23 DIAGNOSIS — L821 Other seborrheic keratosis: Secondary | ICD-10-CM | POA: Diagnosis not present

## 2021-04-23 DIAGNOSIS — L814 Other melanin hyperpigmentation: Secondary | ICD-10-CM | POA: Diagnosis not present

## 2021-04-23 DIAGNOSIS — L304 Erythema intertrigo: Secondary | ICD-10-CM | POA: Diagnosis not present

## 2021-05-11 ENCOUNTER — Other Ambulatory Visit: Payer: Self-pay | Admitting: Family Medicine

## 2021-05-11 DIAGNOSIS — E039 Hypothyroidism, unspecified: Secondary | ICD-10-CM

## 2021-05-27 DIAGNOSIS — F251 Schizoaffective disorder, depressive type: Secondary | ICD-10-CM | POA: Diagnosis not present

## 2021-05-31 DIAGNOSIS — H2513 Age-related nuclear cataract, bilateral: Secondary | ICD-10-CM | POA: Diagnosis not present

## 2021-05-31 DIAGNOSIS — H401431 Capsular glaucoma with pseudoexfoliation of lens, bilateral, mild stage: Secondary | ICD-10-CM | POA: Diagnosis not present

## 2021-06-05 ENCOUNTER — Encounter: Payer: Self-pay | Admitting: Family Medicine

## 2021-06-05 ENCOUNTER — Ambulatory Visit (INDEPENDENT_AMBULATORY_CARE_PROVIDER_SITE_OTHER): Payer: Medicare Other | Admitting: Family Medicine

## 2021-06-05 ENCOUNTER — Other Ambulatory Visit: Payer: Self-pay

## 2021-06-05 VITALS — BP 112/80 | HR 60 | Temp 98.2°F | Ht 69.75 in | Wt 232.4 lb

## 2021-06-05 DIAGNOSIS — Z8546 Personal history of malignant neoplasm of prostate: Secondary | ICD-10-CM | POA: Diagnosis not present

## 2021-06-05 DIAGNOSIS — Z0000 Encounter for general adult medical examination without abnormal findings: Secondary | ICD-10-CM

## 2021-06-05 DIAGNOSIS — F251 Schizoaffective disorder, depressive type: Secondary | ICD-10-CM | POA: Diagnosis not present

## 2021-06-05 DIAGNOSIS — E291 Testicular hypofunction: Secondary | ICD-10-CM | POA: Diagnosis not present

## 2021-06-05 DIAGNOSIS — E039 Hypothyroidism, unspecified: Secondary | ICD-10-CM | POA: Diagnosis not present

## 2021-06-05 DIAGNOSIS — N62 Hypertrophy of breast: Secondary | ICD-10-CM | POA: Diagnosis not present

## 2021-06-05 LAB — CBC WITH DIFFERENTIAL/PLATELET
Basophils Absolute: 0 10*3/uL (ref 0.0–0.1)
Basophils Relative: 1.1 % (ref 0.0–3.0)
Eosinophils Absolute: 0.1 10*3/uL (ref 0.0–0.7)
Eosinophils Relative: 2.3 % (ref 0.0–5.0)
HCT: 40.2 % (ref 39.0–52.0)
Hemoglobin: 13.7 g/dL (ref 13.0–17.0)
Lymphocytes Relative: 30.5 % (ref 12.0–46.0)
Lymphs Abs: 0.8 10*3/uL (ref 0.7–4.0)
MCHC: 34 g/dL (ref 30.0–36.0)
MCV: 99.6 fl (ref 78.0–100.0)
Monocytes Absolute: 0.3 10*3/uL (ref 0.1–1.0)
Monocytes Relative: 10.2 % (ref 3.0–12.0)
Neutro Abs: 1.5 10*3/uL (ref 1.4–7.7)
Neutrophils Relative %: 55.9 % (ref 43.0–77.0)
Platelets: 151 10*3/uL (ref 150.0–400.0)
RBC: 4.04 Mil/uL — ABNORMAL LOW (ref 4.22–5.81)
RDW: 12.9 % (ref 11.5–15.5)
WBC: 2.7 10*3/uL — ABNORMAL LOW (ref 4.0–10.5)

## 2021-06-05 LAB — COMPREHENSIVE METABOLIC PANEL
ALT: 18 U/L (ref 0–53)
AST: 18 U/L (ref 0–37)
Albumin: 4.1 g/dL (ref 3.5–5.2)
Alkaline Phosphatase: 61 U/L (ref 39–117)
BUN: 20 mg/dL (ref 6–23)
CO2: 26 mEq/L (ref 19–32)
Calcium: 9 mg/dL (ref 8.4–10.5)
Chloride: 106 mEq/L (ref 96–112)
Creatinine, Ser: 1.16 mg/dL (ref 0.40–1.50)
GFR: 57.05 mL/min — ABNORMAL LOW (ref >60.00)
Glucose, Bld: 89 mg/dL (ref 70–99)
Potassium: 4.4 mEq/L (ref 3.5–5.1)
Sodium: 140 mEq/L (ref 135–145)
Total Bilirubin: 0.6 mg/dL (ref 0.2–1.2)
Total Protein: 6.3 g/dL (ref 6.0–8.3)

## 2021-06-05 LAB — LIPID PANEL
Cholesterol: 125 mg/dL (ref 0–200)
HDL: 44.3 mg/dL (ref >39.00)
LDL Cholesterol: 64 mg/dL (ref 0–99)
NonHDL: 80.45
Total CHOL/HDL Ratio: 3
Triglycerides: 84 mg/dL (ref 0.0–149.0)
VLDL: 16.8 mg/dL (ref 0.0–40.0)

## 2021-06-05 LAB — TSH: TSH: 2.55 u[IU]/mL (ref 0.35–5.50)

## 2021-06-05 NOTE — Progress Notes (Signed)
Annual Wellness Visit  Patient: Gary Osborn, Male    DOB: [birth year], 87 y.o.   MRN: [id number] Visit Date: 06/05/2021  Today's Provider: Billie Ruddy, MD  Subjective:    Chief Complaint  Patient presents with   Annual Exam   Gary Osborn is a 87 y.o. male who presents today for his Annual Wellness Visit.  HPI Pt states he is doing well.  Had a f/u with Urology.  States he was sent to Dr. Loanne Drilling as a test was inconclusive?.  Started on a medication, but not sure if he has noticed any difference.  Patient endorses 1 recent fall out of the bed.  Pt denies injury at the time of the incident.  Had some soreness of left side.  Endorses recent dental visit with Dr. Mariea Clonts and eye exam with Dr. Sherlene Shams.  Patient also seen by dermatology at his ALF.  Monitoring skin every few months.  Endorses having several areas frozen off.  Patient Active Problem List   Diagnosis Date Noted   Schizoaffective disorder, depressive type ([place]) 06/05/2021   Gynecomastia 07/15/2020   History of prostate cancer 07/15/2020   Primary male hypogonadism 07/15/2020   Epistaxis 11/11/2016   Severe recurrent major depression with psychotic features [facility])    MDD (major depressive disorder), recurrent severe, without psychosis ([place]) 09/14/2014   H/O suicide attempt 09/08/2014   Unsuccessful suicide attempt ([place]) 09/08/2014   Severe major depression without psychotic features ([place]) 09/08/2014   Major depressive disorder, recurrent, severe without psychotic features ([place])    Suicidal ideation    MDD (major depressive disorder), recurrent, severe, with psychosis ([place]) 01/05/2014   Heart palpitations 06/30/2012   Brown hairy tongue 06/30/2012   SINUS BRADYCARDIA 10/04/2008   PROSTATE CANCER, UNSPEC. 10/05/2007   Anxiety state 10/05/2007   Allergic rhinitis 10/05/2007   COLONIC POLYPS, HX OF 10/05/2007   Past Medical History:  Diagnosis Date   ALLERGIC RHINITIS 10/05/2007   ANXIETY 10/05/2007   COLONIC  POLYPS, HX OF 10/05/2007   DEPRESSION 10/04/2008   PROSTATE CANCER, UNSPEC. 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   Past Surgical History:  Procedure Laterality Date   NASAL SINUS SURGERY     PROSTATE SURGERY     singulotomy     for OCD   TONSILLECTOMY     Social History   Tobacco Use   Smoking status: Former    Types: Cigarettes    Quit date: 10/13/1973    Years since quitting: 47.6   Smokeless tobacco: Never  Substance Use Topics   Alcohol use: Yes    Alcohol/week: 28.0 standard drinks    Types: 14 Cans of beer, 14 Standard drinks or equivalent per week    Comment: glass of chardonnay each night   Drug use: No   Family History  Problem Relation Age of Onset   Prostate cancer Father    Breast cancer Sister    Allergies  Allergen Reactions   Cats Claw [Uncaria Tomentosa (Cats Claw)]     PT IS ALLERGIC TO CATS/ DOES NOT HAVE ANY       Medications: Outpatient Medications Prior to Visit  Medication Sig   busPIRone (BUSPAR) 10 MG tablet Take 10 mg by mouth 2 (two) times daily. Take 2 tabs twice daily   clonazePAM (KLONOPIN) 0.5 MG tablet Take 1 tablet (0.5 mg total) by mouth 2 (two) times daily as needed. For severe anxiety   docusate sodium (COLACE) 100 MG capsule Take 100 mg by  mouth daily.   dorzolamide-timolol (COSOPT) 22.3-6.8 MG/ML ophthalmic solution 1 drop 2 (two) times daily.   levothyroxine (SYNTHROID) 50 MCG tablet TAKE 1 TABLET ONCE DAILY BEFORE BREAKFAST.   Multiple Vitamins-Minerals (CENTRUM SILVER PO) Take by mouth daily.   PARoxetine (PAXIL) 40 MG tablet Take 1 tablet (40 mg total) by mouth at bedtime. For depression   tamoxifen (NOLVADEX) 10 MG tablet Take 1 tablet (10 mg total) by mouth daily.   hydrocortisone (PROCTOSOL HC) 2.5 % rectal cream Place 1 application rectally 2 (two) times daily. (Patient not taking: Reported on 06/05/2021)   mirtazapine (REMERON) 45 MG tablet Take 0.5 tablets (22.5 mg total) by mouth at bedtime. For depression (Patient  not taking: Reported on 06/05/2021)   nystatin-triamcinolone ointment (MYCOLOG) Apply 1 application topically 2 (two) times daily.   No facility-administered medications prior to visit.    Allergies  Allergen Reactions   Cats Claw [Uncaria Tomentosa (Cats Claw)]     PT IS ALLERGIC TO CATS/ DOES NOT HAVE ANY    Patient Care Team: Billie Ruddy, MD as PCP - General (Family Medicine) Norma Fredrickson, MD as Consulting Physician (Psychiatry)  Review of Systems General: Denies fever, chills, night sweats, changes in weight, changes in appetite HEENT: Denies headaches, ear pain, changes in vision, rhinorrhea, sore throat CV: Denies CP, palpitations, SOB, orthopnea Pulm: Denies SOB, cough, wheezing GI: Denies abdominal pain, nausea, vomiting, diarrhea, constipation GU: Denies dysuria, hematuria, frequency Msk: Denies muscle cramps, joint pains Neuro: Denies weakness, numbness, tingling Skin: Denies rashes, bruising Psych: Denies depression, anxiety, hallucinations  Last CBC Lab Results  Component Value Date   WBC 3.2 (L) 06/04/2020   HGB 14.2 06/04/2020   HCT 41.7 06/04/2020   MCV 97.7 06/04/2020   MCH 33.3 (H) 06/04/2020   RDW 12.5 06/04/2020   PLT 165 [id number]   Last metabolic panel Lab Results  Component Value Date   GLUCOSE 109 (H) 06/04/2020   NA 139 06/04/2020   K 4.5 06/04/2020   CL 106 06/04/2020   CO2 25 06/04/2020   BUN 22 06/04/2020   CREATININE 1.01 06/04/2020   GFRNONAA 68 06/04/2020   GFRAA 78 06/04/2020   CALCIUM 9.2 06/04/2020   PROT 6.5 06/04/2020   ALBUMIN 4.5 06/01/2019   BILITOT 0.7 06/04/2020   ALKPHOS 86 06/01/2019   AST 19 06/04/2020   ALT 28 06/04/2020   ANIONGAP 4 (L) 10/21/2014   Last lipids Lab Results  Component Value Date   CHOL 143 06/04/2020   HDL 53 06/04/2020   LDLCALC 73 06/04/2020   TRIG 90 06/04/2020   CHOLHDL 2.7 06/04/2020   Last thyroid functions Lab Results  Component Value Date   TSH 2.24 06/04/2020   T3TOTAL  71.7 (L) 09/16/2014      Objective:    Vitals: BP 112/80 (BP Location: Left Arm, Patient Position: Sitting, Cuff Size: Normal)   Pulse 60   Temp 98.2 F (36.8 C) (Oral)   Ht 5' 9.75" (1.772 m)   Wt 232 lb 6.4 oz (105.4 kg)   SpO2 91%   BMI 33.59 kg/m  BP Readings from Last 3 Encounters:  06/05/21 112/80  02/13/21 134/78  12/13/20 140/84    Physical Exam Gen. Pleasant, well developed, well-nourished, in NAD HEENT - [place]/AT, PERRL, EOMI, conjunctive clear, no scleral icterus, no nasal drainage, pharynx without erythema or exudate.  Normal external ears and narrow canals.TMs normal b/l Neck: No JVD, no thyromegaly, no carotid bruits Lungs: no use of accessory muscles, CTAB, no  wheezes, rales or rhonchi Cardiovascular: distant sounding heart sounds, RRR, No r/g/m, no peripheral edema Abdomen: BS present, soft, nontender,nondistended. Musculoskeletal: No deformities, moves all four extremities, no cyanosis or clubbing, normal tone Neuro:  A&Ox3, CN II-XII intact, pill-rolling motion during conversation.  Normal gait Skin:  Warm, dry, intact.  Seborrheic keratosis and skin tags of neck and upper chest.  Age spots on UEs. Psych: normal affect, mood appropriate  Most recent functional status assessment: In your present state of health, do you have any difficulty performing the following activities: 06/05/2021  Hearing? N  Vision? N  Difficulty concentrating or making decisions? N  Comment -  Walking or climbing stairs? N  Dressing or bathing? N  Doing errands, shopping? N  Preparing Food and eating ? -  Using the Toilet? -  In the past six months, have you accidently leaked urine? -  Do you have problems with loss of bowel control? -  Managing your Medications? -  Managing your Finances? -  Housekeeping or managing your Housekeeping? -  Some recent data might be hidden    Most recent fall risk assessment: Fall Risk  06/05/2021  Falls in the past year? 1  Number falls in past  yr: 0  Injury with Fall? 0  Risk for fall due to : -  Follow up -     Most recent depression screenings: PHQ 2/9 Scores 06/05/2021 06/12/2020  PHQ - 2 Score 0 0  PHQ- 9 Score - 0    Most recent cognitive screening: 6CIT Screen 06/12/2020  What Year? 0 points  What month? 0 points  What time? 0 points  Count back from 20 0 points  Months in reverse 0 points  Repeat phrase 0 points  Total Score 0    No results found for any visits on 06/05/21.     Assessment & Plan:      Annual wellness visit done today including the all of the following: Reviewed patient's Family Medical History Reviewed and updated list of patient's medical providers Assessment of cognitive impairment was done Assessed patient's functional ability Established a written schedule for health screening [place] Completed and Reviewed  Exercise Activities and Dietary recommendations  Goals      DIET - INCREASE WATER INTAKE     Try to increase water to 6- 8 cups per day        Immunization History  Administered Date(s) Administered   Fluad Quad(high Dose 65+) 07/09/2019   Influenza Split 08/06/2011, 07/08/2012   Influenza Whole 07/29/2007, 07/26/2008, 07/10/2009, 06/27/2010   Influenza, High Dose Seasonal PF 07/06/2015, 06/30/2018, 07/16/2020   Influenza,inj,Quad PF,6+ Mos 06/22/2013, 06/29/2014   Influenza-Unspecified 07/04/2016, 07/22/2017   Moderna Sars-Covid-2 Vaccination 10/17/2019, 11/14/2019   Pneumococcal Conjugate-13 03/30/2015   Pneumococcal Polysaccharide-23 07/14/2003   Td 10/19/2009   Zoster, Live 12/13/2008    Health Maintenance  Topic Date Due   Zoster Vaccines- Shingrix (1 of 2) Never done   TETANUS/TDAP  10/20/2019   COVID-19 Vaccine (3 - Moderna risk series) 12/12/2019   INFLUENZA VACCINE  05/13/2021   PNA vac Low Risk Adult  Completed   HPV VACCINES  Aged Out    Discussed health benefits of physical activity, and encouraged him to engage in  regular exercise appropriate for his age and condition.   Gynecomastia  -Stable -Continue follow-up with endocrinology - Plan: CBC with Differential/Platelet, TSH  Primary male hypogonadism  -Continue follow-up with urology and endocrinology - Plan: TSH, Lipid panel, CMP  Medicare annual wellness visit, subsequent  History of prostate cancer  -Admission -Continue tamoxifen 10 mg -Continue follow-up with urology - Plan: CBC with Differential/Platelet, CMP  Schizoaffective disorder, depressive type (HCC)  -Continue BuSpar, Klonopin 0.5 mg, Paxil 40 mg -Continue follow-up with Dr. Casimiro Needle, psychiatry - Plan: CBC with Differential/Platelet, TSH  Acquired hypothyroidism  -Continue Synthroid 50 mcg daily - Plan: TSH, Lipid panel  Billie Ruddy, MD

## 2021-06-06 ENCOUNTER — Other Ambulatory Visit: Payer: Self-pay | Admitting: Family Medicine

## 2021-06-06 ENCOUNTER — Other Ambulatory Visit: Payer: Self-pay

## 2021-06-06 ENCOUNTER — Telehealth: Payer: Self-pay

## 2021-06-06 DIAGNOSIS — D72820 Lymphocytosis (symptomatic): Secondary | ICD-10-CM

## 2021-06-06 NOTE — Telephone Encounter (Signed)
Patient called stating he was given amoxicillin on two different visits for a severe dental infection. Patient says he took medication everyday up until his physical appt. And wants to know if that effected his Lab results.

## 2021-06-07 NOTE — Telephone Encounter (Signed)
Called pt, no answer. Left vm. Per Dr Volanda Napoleon, the antibiotics did not have any affect on lab results, will need to come to lab appointment to repeat the lab draw.

## 2021-06-10 ENCOUNTER — Other Ambulatory Visit: Payer: Self-pay

## 2021-06-10 ENCOUNTER — Other Ambulatory Visit (INDEPENDENT_AMBULATORY_CARE_PROVIDER_SITE_OTHER): Payer: Medicare Other

## 2021-06-10 ENCOUNTER — Ambulatory Visit: Payer: Medicare Other

## 2021-06-10 DIAGNOSIS — D72820 Lymphocytosis (symptomatic): Secondary | ICD-10-CM

## 2021-06-10 LAB — CBC WITH DIFFERENTIAL/PLATELET
Basophils Absolute: 0 10*3/uL (ref 0.0–0.1)
Basophils Relative: 1.2 % (ref 0.0–3.0)
Eosinophils Absolute: 0.1 10*3/uL (ref 0.0–0.7)
Eosinophils Relative: 3.5 % (ref 0.0–5.0)
HCT: 40.6 % (ref 39.0–52.0)
Hemoglobin: 13.7 g/dL (ref 13.0–17.0)
Lymphocytes Relative: 32 % (ref 12.0–46.0)
Lymphs Abs: 0.8 10*3/uL (ref 0.7–4.0)
MCHC: 33.8 g/dL (ref 30.0–36.0)
MCV: 99.2 fl (ref 78.0–100.0)
Monocytes Absolute: 0.3 10*3/uL (ref 0.1–1.0)
Monocytes Relative: 11.4 % (ref 3.0–12.0)
Neutro Abs: 1.3 10*3/uL — ABNORMAL LOW (ref 1.4–7.7)
Neutrophils Relative %: 51.9 % (ref 43.0–77.0)
Platelets: 152 10*3/uL (ref 150.0–400.0)
RBC: 4.09 Mil/uL — ABNORMAL LOW (ref 4.22–5.81)
RDW: 12.9 % (ref 11.5–15.5)
WBC: 2.6 10*3/uL — ABNORMAL LOW (ref 4.0–10.5)

## 2021-06-13 ENCOUNTER — Telehealth: Payer: Self-pay | Admitting: Family Medicine

## 2021-06-13 DIAGNOSIS — D72810 Lymphocytopenia: Secondary | ICD-10-CM

## 2021-06-13 NOTE — Telephone Encounter (Signed)
PT called to get the results from the labs done on Monday where Dr.Banks had him repeat the labs due to seeing low white blood cell count.

## 2021-06-14 NOTE — Telephone Encounter (Signed)
Called patient about results, no answer, left message to call office.

## 2021-06-18 NOTE — Progress Notes (Signed)
Spoke with patient, verbalized understanding. Wants to go to heme-onc.

## 2021-06-19 NOTE — Telephone Encounter (Signed)
Spoke with patient, verbalized understanding. Placed referral for Heme-Onc.

## 2021-06-24 NOTE — Telephone Encounter (Signed)
Spoke with pt, informed him the referral is through [facility].

## 2021-06-24 NOTE — Telephone Encounter (Signed)
Patient is wanting to know if the placed he was referred to is with [place].  Patient can be contacted at [phone].  Please advise.

## 2021-06-24 NOTE — Addendum Note (Signed)
Addended by: Anderson Malta on: 06/24/2021 04:06 PM   Modules accepted: Orders

## 2021-06-25 ENCOUNTER — Other Ambulatory Visit: Payer: Self-pay | Admitting: *Deleted

## 2021-06-25 DIAGNOSIS — D72810 Lymphocytopenia: Secondary | ICD-10-CM

## 2021-06-25 NOTE — Progress Notes (Signed)
Referral reviewed and patient scheduled for 9/28 with labs

## 2021-07-02 DIAGNOSIS — Z23 Encounter for immunization: Secondary | ICD-10-CM | POA: Diagnosis not present

## 2021-07-08 ENCOUNTER — Inpatient Hospital Stay: Payer: Medicare Other | Attending: Oncology

## 2021-07-08 ENCOUNTER — Other Ambulatory Visit: Payer: Self-pay

## 2021-07-08 DIAGNOSIS — Z79810 Long term (current) use of selective estrogen receptor modulators (SERMs): Secondary | ICD-10-CM | POA: Insufficient documentation

## 2021-07-08 DIAGNOSIS — Z87891 Personal history of nicotine dependence: Secondary | ICD-10-CM | POA: Insufficient documentation

## 2021-07-08 DIAGNOSIS — N62 Hypertrophy of breast: Secondary | ICD-10-CM | POA: Insufficient documentation

## 2021-07-08 DIAGNOSIS — E039 Hypothyroidism, unspecified: Secondary | ICD-10-CM | POA: Diagnosis not present

## 2021-07-08 DIAGNOSIS — Z79899 Other long term (current) drug therapy: Secondary | ICD-10-CM | POA: Insufficient documentation

## 2021-07-08 DIAGNOSIS — K921 Melena: Secondary | ICD-10-CM | POA: Insufficient documentation

## 2021-07-08 DIAGNOSIS — Z8546 Personal history of malignant neoplasm of prostate: Secondary | ICD-10-CM | POA: Insufficient documentation

## 2021-07-08 DIAGNOSIS — Z8719 Personal history of other diseases of the digestive system: Secondary | ICD-10-CM | POA: Insufficient documentation

## 2021-07-08 DIAGNOSIS — D72819 Decreased white blood cell count, unspecified: Secondary | ICD-10-CM | POA: Diagnosis not present

## 2021-07-08 DIAGNOSIS — D72810 Lymphocytopenia: Secondary | ICD-10-CM

## 2021-07-08 LAB — CBC WITH DIFFERENTIAL (CANCER CENTER ONLY)
Abs Immature Granulocytes: 0 10*3/uL (ref 0.00–0.07)
Basophils Absolute: 0 10*3/uL (ref 0.0–0.1)
Basophils Relative: 1 %
Eosinophils Absolute: 0.1 10*3/uL (ref 0.0–0.5)
Eosinophils Relative: 3 %
HCT: 39.9 % (ref 39.0–52.0)
Hemoglobin: 13.3 g/dL (ref 13.0–17.0)
Immature Granulocytes: 0 %
Lymphocytes Relative: 32 %
Lymphs Abs: 1.2 10*3/uL (ref 0.7–4.0)
MCH: 32.8 pg (ref 26.0–34.0)
MCHC: 33.3 g/dL (ref 30.0–36.0)
MCV: 98.5 fL (ref 80.0–100.0)
Monocytes Absolute: 0.4 10*3/uL (ref 0.1–1.0)
Monocytes Relative: 11 %
Neutro Abs: 1.9 10*3/uL (ref 1.7–7.7)
Neutrophils Relative %: 53 %
Platelet Count: 163 10*3/uL (ref 150–400)
RBC: 4.05 MIL/uL — ABNORMAL LOW (ref 4.22–5.81)
RDW: 12.4 % (ref 11.5–15.5)
WBC Count: 3.6 10*3/uL — ABNORMAL LOW (ref 4.0–10.5)
nRBC: 0 % (ref 0.0–0.2)

## 2021-07-08 LAB — SAVE SMEAR(SSMR), FOR PROVIDER SLIDE REVIEW

## 2021-07-08 NOTE — Progress Notes (Signed)
New Hematology/Oncology Consult   Requesting MD: Dr. Grier Mitts  [phone]      Reason for Consult: Leukopenia  HPI: Gary Osborn is an 87 year old man referred for evaluation of leukopenia.  Review of labs in the EMR show mild leukopenia dating to December 2012.  The absolute neutrophil count is typically in normal to low normal range.  Labs done 06/10/2021 shows total white count 2.6, ANC 1.3, normal hemoglobin and platelet count.  Labs in our office done 07/08/2021 show total white count 3.6, absolute for count 1.9, normal hemoglobin and platelet count.   Past Medical History:  Diagnosis Date   ALLERGIC RHINITIS 10/05/2007   ANXIETY 10/05/2007   COLONIC POLYPS, HX OF 10/05/2007   DEPRESSION 10/04/2008   PROSTATE CANCER, UNSPEC. 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  :   Past Surgical History:  Procedure Laterality Date   NASAL SINUS SURGERY     PROSTATE SURGERY     singulotomy     for OCD   TONSILLECTOMY    :   Current Outpatient Medications:    clonazePAM (KLONOPIN) 0.5 MG tablet, Take by mouth., Disp: , Rfl:    busPIRone (BUSPAR) 10 MG tablet, Take 10 mg by mouth 2 (two) times daily. Take 2 tabs twice daily, Disp: , Rfl:    busPIRone (BUSPAR) 10 MG tablet, Take by mouth., Disp: , Rfl:    clonazePAM (KLONOPIN) 0.5 MG tablet, Take 1 tablet (0.5 mg total) by mouth 2 (two) times daily as needed. For severe anxiety, Disp: 8 tablet, Rfl: 0   docusate sodium (COLACE) 100 MG capsule, Take 100 mg by mouth daily., Disp: , Rfl:    dorzolamide-timolol (COSOPT) 22.3-6.8 MG/ML ophthalmic solution, 1 drop 2 (two) times daily., Disp: , Rfl:    hydrocortisone (PROCTOSOL HC) 2.5 % rectal cream, Place 1 application rectally 2 (two) times daily. (Patient not taking: Reported on 06/05/2021), Disp: 30 g, Rfl: 0   levothyroxine (SYNTHROID) 50 MCG tablet, TAKE 1 TABLET ONCE DAILY BEFORE BREAKFAST., Disp: 30 tablet, Rfl: 3   mirtazapine (REMERON) 15 MG tablet, Take 15 mg by mouth at  bedtime., Disp: , Rfl:    mirtazapine (REMERON) 45 MG tablet, Take 0.5 tablets (22.5 mg total) by mouth at bedtime. For depression (Patient not taking: Reported on 06/05/2021), Disp: 30 tablet, Rfl: 0   Multiple Vitamins-Minerals (CENTRUM SILVER PO), Take by mouth daily., Disp: , Rfl:    nystatin-triamcinolone ointment (MYCOLOG), Apply 1 application topically 2 (two) times daily., Disp: 30 g, Rfl: 0   PARoxetine (PAXIL) 40 MG tablet, Take 1 tablet (40 mg total) by mouth at bedtime. For depression, Disp: 30 tablet, Rfl: 0   tamoxifen (NOLVADEX) 10 MG tablet, Take 1 tablet (10 mg total) by mouth daily., Disp: 90 tablet, Rfl: 3:    Allergies  Allergen Reactions   Cats Claw [Uncaria Tomentosa (Cats Claw)]     PT IS ALLERGIC TO CATS/ DOES NOT HAVE ANY  :  FH: No family history of a blood disorder.  SOCIAL HISTORY: Gary Osborn lives in [place] with his wife.  They reside at [facility].  He has a son who lives in [place].  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  He quit smoking 50 years ago.  He quit drinking alcohol a few months ago in an attempt to lose weight.  Prior to quitting he estimates to 3 beers per night, Friday, Saturday and Sunday.  Review of Systems: No fevers or sweats.  No recurrent infections.  No  anorexia or weight loss.  He denies pain.  No unusual headaches.  No visual disturbance.  No dysphagia.  Rare nausea.  He has dyspnea on exertion.  No cough.  No chest pain.  No urinary symptoms.  No change in bowel habits.  He notes black stool on the toilet tissue after having a bowel movement.  This has been occurring for the past few months.   Physical Exam:  Blood pressure 121/79, pulse 70, temperature (!) 97.5 F (36.4 C), temperature source Temporal, resp. rate 18, weight 234 lb 9.6 oz (106.4 kg), SpO2 98 %.  Lungs: Lungs clear bilaterally. Cardiac: Regular rate and rhythm. Abdomen: No hepatosplenomegaly. Vascular: No leg edema.  Prominent vein pattern over  the abdominal wall. Lymph nodes: No palpable cervical, supraclavicular, axillary or inguinal lymph nodes. Breast: Bilateral gynecomastia. GU: Tip of the penis with erythema/superficial ulceration.  Testicles are small. Skin: Brown discoloration over the pretibial regions.  LABS:   Recent Labs    07/08/21 1250  WBC 3.6*  HGB 13.3  HCT 39.9  PLT 163  Peripheral blood smear-mostly mature neutrophils and lymphocytes, no blasts or other young forms; platelets normal in number; variation in size of red cells, few ovalocytes.  RADIOLOGY:  No results found.  Assessment and Plan:   Mild chronic leukopenia Gynecomastia on tamoxifen Anxiety/depression Hypothyroid Alcohol use Black stools Remote history of prostate cancer Probable balanitis  Gary Osborn has been referred for evaluation of leukopenia.  He has chronic mild leukopenia over many years.  Potential etiologies were discussed.  The leukopenia may be due to underlying liver disease.  We are referring him for an abdominal ultrasound to evaluate for cirrhosis.  Ferritin and B12 will be added to the blood work from 07/08/2021.  He notes black stools.  He will complete a set of stool cards.  He will return for lab and follow-up in approximately 1 month.  We are available to see him sooner if needed.  Patient seen with Dr. Benay Spice.   Ned Card, NP 07/10/2021, 1:48 PM  This was a shared visit with Ned Card.  Gary Osborn was interviewed and examined.  I reviewed the peripheral blood smear.  He is referred for evaluation of leukopenia.  The leukopenia is most likely a benign normal variant or related to chronic liver disease.  I have a low clinical suspicion for myelodysplasia or a hematopoietic malignancy.  We will perform additional diagnostic evaluation for underlying liver disease.  I was present for greater than 50% of today's visit. I performed medical decision making.  Julieanne Manson, MD

## 2021-07-10 ENCOUNTER — Ambulatory Visit: Payer: Medicare Other

## 2021-07-10 ENCOUNTER — Encounter: Payer: Self-pay | Admitting: Nurse Practitioner

## 2021-07-10 ENCOUNTER — Other Ambulatory Visit: Payer: Self-pay

## 2021-07-10 ENCOUNTER — Inpatient Hospital Stay (HOSPITAL_BASED_OUTPATIENT_CLINIC_OR_DEPARTMENT_OTHER): Payer: Medicare Other | Admitting: Nurse Practitioner

## 2021-07-10 VITALS — BP 121/79 | HR 70 | Temp 97.5°F | Resp 18 | Wt 234.6 lb

## 2021-07-10 DIAGNOSIS — F419 Anxiety disorder, unspecified: Secondary | ICD-10-CM | POA: Diagnosis not present

## 2021-07-10 DIAGNOSIS — E039 Hypothyroidism, unspecified: Secondary | ICD-10-CM | POA: Diagnosis not present

## 2021-07-10 DIAGNOSIS — Z8546 Personal history of malignant neoplasm of prostate: Secondary | ICD-10-CM

## 2021-07-10 DIAGNOSIS — Z79899 Other long term (current) drug therapy: Secondary | ICD-10-CM

## 2021-07-10 DIAGNOSIS — F329 Major depressive disorder, single episode, unspecified: Secondary | ICD-10-CM | POA: Diagnosis not present

## 2021-07-10 DIAGNOSIS — Z79810 Long term (current) use of selective estrogen receptor modulators (SERMs): Secondary | ICD-10-CM | POA: Diagnosis not present

## 2021-07-10 DIAGNOSIS — Z87891 Personal history of nicotine dependence: Secondary | ICD-10-CM

## 2021-07-10 DIAGNOSIS — N62 Hypertrophy of breast: Secondary | ICD-10-CM

## 2021-07-10 DIAGNOSIS — K921 Melena: Secondary | ICD-10-CM

## 2021-07-10 DIAGNOSIS — D72819 Decreased white blood cell count, unspecified: Secondary | ICD-10-CM | POA: Diagnosis not present

## 2021-07-10 LAB — VITAMIN B12: Vitamin B-12: 482 pg/mL (ref 180–914)

## 2021-07-10 LAB — FERRITIN: Ferritin: 161 ng/mL (ref 24–336)

## 2021-07-11 DIAGNOSIS — N62 Hypertrophy of breast: Secondary | ICD-10-CM | POA: Diagnosis not present

## 2021-07-11 DIAGNOSIS — Z79899 Other long term (current) drug therapy: Secondary | ICD-10-CM | POA: Diagnosis not present

## 2021-07-11 DIAGNOSIS — E039 Hypothyroidism, unspecified: Secondary | ICD-10-CM | POA: Diagnosis not present

## 2021-07-11 DIAGNOSIS — D72819 Decreased white blood cell count, unspecified: Secondary | ICD-10-CM | POA: Diagnosis not present

## 2021-07-11 DIAGNOSIS — K921 Melena: Secondary | ICD-10-CM | POA: Diagnosis not present

## 2021-07-11 DIAGNOSIS — Z8719 Personal history of other diseases of the digestive system: Secondary | ICD-10-CM | POA: Diagnosis not present

## 2021-07-12 ENCOUNTER — Other Ambulatory Visit (HOSPITAL_BASED_OUTPATIENT_CLINIC_OR_DEPARTMENT_OTHER): Payer: Self-pay

## 2021-07-12 DIAGNOSIS — N62 Hypertrophy of breast: Secondary | ICD-10-CM | POA: Diagnosis not present

## 2021-07-12 DIAGNOSIS — Z8719 Personal history of other diseases of the digestive system: Secondary | ICD-10-CM | POA: Diagnosis not present

## 2021-07-12 DIAGNOSIS — Z79899 Other long term (current) drug therapy: Secondary | ICD-10-CM | POA: Diagnosis not present

## 2021-07-12 DIAGNOSIS — D72819 Decreased white blood cell count, unspecified: Secondary | ICD-10-CM | POA: Diagnosis not present

## 2021-07-12 DIAGNOSIS — K921 Melena: Secondary | ICD-10-CM | POA: Diagnosis not present

## 2021-07-12 DIAGNOSIS — E039 Hypothyroidism, unspecified: Secondary | ICD-10-CM | POA: Diagnosis not present

## 2021-07-12 LAB — OCCULT BLOOD X 1 CARD TO LAB, STOOL
Fecal Occult Bld: NEGATIVE
Fecal Occult Bld: NEGATIVE
Fecal Occult Bld: NEGATIVE

## 2021-07-17 ENCOUNTER — Other Ambulatory Visit: Payer: Self-pay | Admitting: Nurse Practitioner

## 2021-07-17 ENCOUNTER — Ambulatory Visit (HOSPITAL_BASED_OUTPATIENT_CLINIC_OR_DEPARTMENT_OTHER)
Admission: RE | Admit: 2021-07-17 | Discharge: 2021-07-17 | Disposition: A | Discharge status: Home / Self Care | Payer: Medicare Other | Source: Ambulatory Visit | Attending: Nurse Practitioner | Admitting: Nurse Practitioner

## 2021-07-17 ENCOUNTER — Other Ambulatory Visit: Payer: Self-pay

## 2021-07-17 DIAGNOSIS — D72819 Decreased white blood cell count, unspecified: Secondary | ICD-10-CM | POA: Insufficient documentation

## 2021-07-17 DIAGNOSIS — B179 Acute viral hepatitis, unspecified: Secondary | ICD-10-CM | POA: Diagnosis not present

## 2021-07-17 DIAGNOSIS — K760 Fatty (change of) liver, not elsewhere classified: Secondary | ICD-10-CM | POA: Diagnosis not present

## 2021-07-17 DIAGNOSIS — K862 Cyst of pancreas: Secondary | ICD-10-CM | POA: Diagnosis not present

## 2021-07-17 DIAGNOSIS — K7689 Other specified diseases of liver: Secondary | ICD-10-CM | POA: Diagnosis not present

## 2021-07-29 DIAGNOSIS — Z23 Encounter for immunization: Secondary | ICD-10-CM | POA: Diagnosis not present

## 2021-07-30 NOTE — Addendum Note (Signed)
Addended by: Lenox Ponds E on: 07/30/2021 03:31 PM   Modules accepted: Orders

## 2021-08-09 ENCOUNTER — Other Ambulatory Visit: Payer: Self-pay

## 2021-08-09 ENCOUNTER — Inpatient Hospital Stay: Payer: Medicare Other | Attending: Oncology | Admitting: Oncology

## 2021-08-09 ENCOUNTER — Encounter: Payer: Self-pay | Admitting: Oncology

## 2021-08-09 ENCOUNTER — Encounter (INDEPENDENT_AMBULATORY_CARE_PROVIDER_SITE_OTHER): Payer: Self-pay

## 2021-08-09 ENCOUNTER — Inpatient Hospital Stay: Payer: Medicare Other

## 2021-08-09 VITALS — BP 139/72 | HR 64 | Temp 98.7°F | Resp 20 | Ht 69.0 in | Wt 233.8 lb

## 2021-08-09 DIAGNOSIS — Z79810 Long term (current) use of selective estrogen receptor modulators (SERMs): Secondary | ICD-10-CM | POA: Insufficient documentation

## 2021-08-09 DIAGNOSIS — K760 Fatty (change of) liver, not elsewhere classified: Secondary | ICD-10-CM | POA: Insufficient documentation

## 2021-08-09 DIAGNOSIS — D72819 Decreased white blood cell count, unspecified: Secondary | ICD-10-CM | POA: Insufficient documentation

## 2021-08-09 DIAGNOSIS — N62 Hypertrophy of breast: Secondary | ICD-10-CM | POA: Insufficient documentation

## 2021-08-09 DIAGNOSIS — Z8546 Personal history of malignant neoplasm of prostate: Secondary | ICD-10-CM | POA: Diagnosis not present

## 2021-08-09 LAB — CMP (CANCER CENTER ONLY)
ALT: 18 U/L (ref 0–44)
AST: 22 U/L (ref 15–41)
Albumin: 4.1 g/dL (ref 3.5–5.0)
Alkaline Phosphatase: 66 U/L (ref 38–126)
Anion gap: 7 (ref 5–15)
BUN: 20 mg/dL (ref 8–23)
CO2: 28 mmol/L (ref 22–32)
Calcium: 9.1 mg/dL (ref 8.9–10.3)
Chloride: 105 mmol/L (ref 98–111)
Creatinine: 1.33 mg/dL — ABNORMAL HIGH (ref 0.61–1.24)
GFR, Estimated: 52 mL/min — ABNORMAL LOW (ref >60)
Glucose, Bld: 93 mg/dL (ref 70–99)
Potassium: 4.4 mmol/L (ref 3.5–5.1)
Sodium: 140 mmol/L (ref 135–145)
Total Bilirubin: 0.6 mg/dL (ref 0.3–1.2)
Total Protein: 6.6 g/dL (ref 6.5–8.1)

## 2021-08-09 LAB — CBC WITH DIFFERENTIAL (CANCER CENTER ONLY)
Abs Immature Granulocytes: 0.01 10*3/uL (ref 0.00–0.07)
Basophils Absolute: 0 10*3/uL (ref 0.0–0.1)
Basophils Relative: 1 %
Eosinophils Absolute: 0.1 10*3/uL (ref 0.0–0.5)
Eosinophils Relative: 3 %
HCT: 40.1 % (ref 39.0–52.0)
Hemoglobin: 13.2 g/dL (ref 13.0–17.0)
Immature Granulocytes: 0 %
Lymphocytes Relative: 30 %
Lymphs Abs: 1 10*3/uL (ref 0.7–4.0)
MCH: 32.8 pg (ref 26.0–34.0)
MCHC: 32.9 g/dL (ref 30.0–36.0)
MCV: 99.8 fL (ref 80.0–100.0)
Monocytes Absolute: 0.4 10*3/uL (ref 0.1–1.0)
Monocytes Relative: 12 %
Neutro Abs: 1.8 10*3/uL (ref 1.7–7.7)
Neutrophils Relative %: 54 %
Platelet Count: 161 10*3/uL (ref 150–400)
RBC: 4.02 MIL/uL — ABNORMAL LOW (ref 4.22–5.81)
RDW: 12.8 % (ref 11.5–15.5)
WBC Count: 3.3 10*3/uL — ABNORMAL LOW (ref 4.0–10.5)
nRBC: 0 % (ref 0.0–0.2)

## 2021-08-09 NOTE — Progress Notes (Signed)
  [facility] OFFICE PROGRESS NOTE   Diagnosis: Leukopenia  INTERVAL HISTORY:   Gary Osborn returns as scheduled.  He feels well.  No complaint.  We saw him last month for evaluation of leukopenia.  Additional hematologic evaluation revealed a normal vitamin B12 and ferritin levels.  The stool Hemoccults returned negative. He underwent an abdominal ultrasound to look for evidence of cirrhosis.  This revealed fatty infiltration of the liver and hepatic cyst.  There was a cyst near the pancreas at the midline of uncertain etiology.  The spleen was small and there is normal direction of blood flow from the portal vein. Objective:  Vital signs in last 24 hours:  Blood pressure 139/72, pulse 64, temperature 98.7 F (37.1 C), temperature source Oral, resp. rate 20, height 5\' 9"  (1.753 m), weight 233 lb 12.8 oz (106.1 kg), SpO2 98 %.    Resp: Lungs clear bilaterally Cardio: Regular rate and rhythm GI: No hepatosplenomegaly Vascular: No leg edema GU: Superficial ulceration at the head of the penis   Lab Results:  Lab Results  Component Value Date   WBC 3.3 (L) 08/09/2021   HGB 13.2 08/09/2021   HCT 40.1 08/09/2021   MCV 99.8 08/09/2021   PLT 161 08/09/2021   NEUTROABS 1.8 08/09/2021    CMP  Lab Results  Component Value Date   NA 140 08/09/2021   K 4.4 08/09/2021   CL 105 08/09/2021   CO2 28 08/09/2021   GLUCOSE 93 08/09/2021   BUN 20 08/09/2021   CREATININE 1.33 (H) 08/09/2021   CALCIUM 9.1 08/09/2021   PROT 6.6 08/09/2021   ALBUMIN 4.1 08/09/2021   AST 22 08/09/2021   ALT 18 08/09/2021   ALKPHOS 66 08/09/2021   BILITOT 0.6 08/09/2021   GFRNONAA 52 (L) 08/09/2021   GFRAA 78 06/04/2020     Medications: I have reviewed the patient's current medications.   Assessment/Plan: Mild chronic leukopenia Gynecomastia on tamoxifen Anxiety/depression Hypothyroid Alcohol use Black stools Remote history of prostate cancer Probable balanitis-persistent on  exam 08/09/2021 Abdominal ultrasound 07/17/2021-evidence of fatty liver, midline "cyst "of uncertain etiology, normal direction of flow from portal vein, no splenomegaly    Disposition: Gary Osborn has chronic leukopenia.  The hemoglobin and platelets are normal.  The white count is stable today.  The etiology of leukopenia is unclear.  This may be a benign normal variant.  The differential diagnosis includes undiagnosed chronic liver disease, and myelodysplasia.  I have a low clinical suspicion for hematopoietic malignancy.  I recommend observation.  He will return for an office visit and CBC in 6 months.  He will follow-up with Dr. Volanda Napoleon for evaluation of the skin breakdown at the penis.  I did not see a clear etiology to recommend an abdominal CT for follow-up of the midline cyst.  Betsy Coder, MD  08/09/2021  11:25 AM

## 2021-08-19 ENCOUNTER — Ambulatory Visit (INDEPENDENT_AMBULATORY_CARE_PROVIDER_SITE_OTHER): Payer: Medicare Other | Admitting: Endocrinology

## 2021-08-19 ENCOUNTER — Other Ambulatory Visit: Payer: Self-pay

## 2021-08-19 VITALS — BP 110/60 | HR 93 | Ht 69.0 in | Wt 233.4 lb

## 2021-08-19 DIAGNOSIS — N62 Hypertrophy of breast: Secondary | ICD-10-CM | POA: Diagnosis not present

## 2021-08-19 NOTE — Progress Notes (Signed)
Subjective:    Patient ID: Gary Osborn, male    DOB: [birth year], 87 y.o.   MRN: [id number]  HPI Pt returns for f/u of low testosterone (he had 2 biological children; he had surgical reduction of prominent nipples in approx 1980; he was rx'ed tamoxifen for bilat breast growth; he can't have rx for testosterone, due to h/o prostate ca; urol released him from their care in 2021; gonadotropins are menopausal; reason for low testosterone is unknown, as he never had orchiectomy).  Pt says breasts shrinkage has slowed since last ov.  Pt requests to recheck E2.   Past Medical History:  Diagnosis Date   ALLERGIC RHINITIS 10/05/2007   ANXIETY 10/05/2007   COLONIC POLYPS, HX OF 10/05/2007   DEPRESSION 10/04/2008   PROSTATE CANCER, UNSPEC. 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    Past Surgical History:  Procedure Laterality Date   NASAL SINUS SURGERY     PROSTATE SURGERY     singulotomy     for OCD   TONSILLECTOMY      Social History   Socioeconomic History   Marital status: Married    Spouse name: Not on file   Number of children: 2   Years of education: Not on file   Highest education level: Not on file  Occupational History   Occupation: Retired    Fish farm manager: RETIRED  Tobacco Use   Smoking status: Former    Types: Cigarettes    Quit date: 10/13/1973    Years since quitting: 47.8   Smokeless tobacco: Never  Substance and Sexual Activity   Alcohol use: Yes    Alcohol/week: 28.0 standard drinks    Types: 14 Cans of beer, 14 Standard drinks or equivalent per week    Comment: glass of chardonnay each night   Drug use: No   Sexual activity: Not on file  Other Topics Concern   Not on file  Social History Narrative   Not on file   Social Determinants of Health   Financial Resource Strain: Not on file  Food Insecurity: Not on file  Transportation Needs: Not on file  Physical Activity: Not on file  Stress: Not on file  Social Connections: Not on file  Intimate Partner  Violence: Not on file    Current Outpatient Medications on File Prior to Visit  Medication Sig Dispense Refill   busPIRone (BUSPAR) 10 MG tablet Take 10 mg by mouth 2 (two) times daily. Take 2 tabs twice daily     clonazePAM (KLONOPIN) 0.5 MG tablet Take 1 tablet (0.5 mg total) by mouth 2 (two) times daily as needed. For severe anxiety 8 tablet 0   docusate sodium (COLACE) 100 MG capsule Take 100 mg by mouth daily.     dorzolamide-timolol (COSOPT) 22.3-6.8 MG/ML ophthalmic solution Place 1 drop into both eyes 2 (two) times daily.     hydrocortisone (PROCTOSOL HC) 2.5 % rectal cream Place 1 application rectally 2 (two) times daily. 30 g 0   levothyroxine (SYNTHROID) 50 MCG tablet TAKE 1 TABLET ONCE DAILY BEFORE BREAKFAST. 30 tablet 3   mirtazapine (REMERON) 15 MG tablet Take 15 mg by mouth at bedtime.     mirtazapine (REMERON) 45 MG tablet Take 0.5 tablets (22.5 mg total) by mouth at bedtime. For depression 30 tablet 0   Multiple Vitamins-Minerals (CENTRUM SILVER PO) Take by mouth daily.     nystatin-triamcinolone ointment (MYCOLOG) Apply 1 application topically 2 (two) times daily. 30 g 0   PARoxetine (PAXIL) 40 MG  tablet Take 1 tablet (40 mg total) by mouth at bedtime. For depression 30 tablet 0   tamoxifen (NOLVADEX) 10 MG tablet Take 1 tablet (10 mg total) by mouth daily. 90 tablet 3   No current facility-administered medications on file prior to visit.    Allergies  Allergen Reactions   Cats Claw [Uncaria Tomentosa (Cats Claw)]     PT IS ALLERGIC TO CATS/ DOES NOT HAVE ANY    Family History  Problem Relation Age of Onset   Prostate cancer Father    Breast cancer Sister     BP 110/60 (BP Location: Right Arm, Patient Position: Sitting, Cuff Size: Large)   Pulse 93   Ht 5\' 9"  (1.753 m)   Wt 233 lb 6.4 oz (105.9 kg)   SpO2 93%   BMI 34.47 kg/m    Review of Systems     Objective:   Physical Exam VITAL SIGNS:  See vs page GENERAL: no distress BREASTS: moderate bilat  pseudogynecomastia, but no palpable ductal tissue.     Lab Results  Component Value Date   TESTOSTERONE 14 (L) 06/04/2020   Lab Results  Component Value Date   PSA 0.00 (L) 06/01/2019   PSA 0.00 Repeated and verified X2. (L) 05/22/2017   PSA 0.00 (L) 10/05/2007   Lab Results  Component Value Date   TSH 2.55 06/05/2021   T3TOTAL 71.7 (L) 09/16/2014      Assessment & Plan:  Gynedomastia/pseudogynecomastia: uncontrolled.    Patient Instructions  Blood tests are requested for you today.  We'll let you know about the results.  Please continue the same Tamoxifen.   Losing weight also helps reduce the breast swelling.   Please come back for a follow-up appointment in 12 months.

## 2021-08-19 NOTE — Patient Instructions (Addendum)
Blood tests are requested for you today.  We'll let you know about the results.  Please continue the same Tamoxifen.   Losing weight also helps reduce the breast swelling.   Please come back for a follow-up appointment in 12 months.

## 2021-08-20 DIAGNOSIS — D1801 Hemangioma of skin and subcutaneous tissue: Secondary | ICD-10-CM | POA: Diagnosis not present

## 2021-08-20 DIAGNOSIS — L821 Other seborrheic keratosis: Secondary | ICD-10-CM | POA: Diagnosis not present

## 2021-08-20 DIAGNOSIS — L853 Xerosis cutis: Secondary | ICD-10-CM | POA: Diagnosis not present

## 2021-08-20 DIAGNOSIS — L570 Actinic keratosis: Secondary | ICD-10-CM | POA: Diagnosis not present

## 2021-08-20 DIAGNOSIS — L859 Epidermal thickening, unspecified: Secondary | ICD-10-CM | POA: Diagnosis not present

## 2021-08-20 DIAGNOSIS — L304 Erythema intertrigo: Secondary | ICD-10-CM | POA: Diagnosis not present

## 2021-08-20 DIAGNOSIS — L814 Other melanin hyperpigmentation: Secondary | ICD-10-CM | POA: Diagnosis not present

## 2021-08-26 ENCOUNTER — Telehealth: Payer: Self-pay | Admitting: Endocrinology

## 2021-08-26 NOTE — Telephone Encounter (Signed)
Pt had his testosterone labs done 11/7 and would like the results of those.   Pt contact [phone]

## 2021-08-27 ENCOUNTER — Other Ambulatory Visit: Payer: Self-pay

## 2021-08-27 NOTE — Telephone Encounter (Signed)
Informed patient that results haven't yet been received and that he will receive a call with his results once we have them

## 2021-08-28 ENCOUNTER — Ambulatory Visit (INDEPENDENT_AMBULATORY_CARE_PROVIDER_SITE_OTHER): Payer: Medicare Other | Admitting: Family Medicine

## 2021-08-28 VITALS — BP 118/80 | HR 69 | Temp 98.5°F | Wt 234.8 lb

## 2021-08-28 DIAGNOSIS — B3742 Candidal balanitis: Secondary | ICD-10-CM

## 2021-08-28 LAB — ESTRADIOL, FREE
Estradiol, Free: 0.04 pg/mL
Estradiol: 2 pg/mL (ref <=29)

## 2021-08-28 MED ORDER — CLOTRIMAZOLE 1 % EX CREA: 1.0000 "application " | TOPICAL_CREAM | Freq: Two times a day (BID) | CUTANEOUS | 0 refills | Status: DC

## 2021-08-28 MED ORDER — FLUCONAZOLE 150 MG PO TABS
150.0000 mg | ORAL_TABLET | Freq: Once | ORAL | 0 refills | Status: AC
Start: 2021-08-28 — End: 2021-08-28

## 2021-08-28 NOTE — Progress Notes (Signed)
Subjective:    Patient ID: Gary Osborn, male    DOB: 1933-01-10, 87 y.o.   MRN: [id number]  Chief Complaint  Patient presents with   Rash    Skin breakdown on penis, does not hurt. When doctor held it is was painful. Sometimes has to wear depends,and sometimes feels like penis is pushing against the depend.    HPI Patient was seen today for acute concern.  Patient states noticing his penis was sore after recent doctor's appointment where he had it examined.  Patient had not discomfort prior to that appointment.  Patient unable to see pubic area.  Patient endorses some urinary incontinence status post prostatectomy.  Past Medical History:  Diagnosis Date   ALLERGIC RHINITIS 10/05/2007   ANXIETY 10/05/2007   COLONIC POLYPS, HX OF 10/05/2007   DEPRESSION 10/04/2008   PROSTATE CANCER, UNSPEC. 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    Allergies  Allergen Reactions   Cats Claw [Uncaria Tomentosa (Cats Claw)]     PT IS ALLERGIC TO CATS/ DOES NOT HAVE ANY    ROS General: Denies fever, chills, night sweats, changes in weight, changes in appetite HEENT: Denies headaches, ear pain, changes in vision, rhinorrhea, no recent doctor's appointment throat CV: Denies CP, palpitations, SOB, orthopnea Pulm: Denies SOB, cough, wheezing GI: Denies abdominal pain, nausea, vomiting, diarrhea, constipation GU: Denies dysuria, hematuria, frequency,  + penile discomfort Msk: Denies muscle cramps, joint pains Neuro: Denies weakness, numbness, tingling Skin: Denies rashes, bruising Psych: Denies depression, anxiety, hallucinations    Objective:    Blood pressure 118/80, pulse 69, temperature 98.5 F (36.9 C), temperature source Oral, weight 234 lb 12.8 oz (106.5 kg), SpO2 95 %.  Gen. Pleasant, well-nourished, in no distress, normal affect   HEENT: [place]/AT, face symmetric, conjunctiva clear, no scleral icterus, PERRLA, EOMI, nares patent without drainage Lungs: no accessory muscle  use Cardiovascular: RRR, no peripheral edema GU: Erythema, edema, and smegma of the glans penis Musculoskeletal: No deformities, no cyanosis or clubbing, normal tone Neuro:  A&Ox3, CN II-XII intact, normal gait Skin:  Warm, no lesions/ rash   Wt Readings from Last 3 Encounters:  08/19/21 233 lb 6.4 oz (105.9 kg)  08/09/21 233 lb 12.8 oz (106.1 kg)  07/10/21 234 lb 9.6 oz (106.4 kg)    Lab Results  Component Value Date   WBC 3.3 (L) 08/09/2021   HGB 13.2 08/09/2021   HCT 40.1 08/09/2021   PLT 161 08/09/2021   GLUCOSE 93 08/09/2021   CHOL 125 06/05/2021   TRIG 84.0 06/05/2021   HDL 44.30 06/05/2021   LDLCALC 64 06/05/2021   ALT 18 08/09/2021   AST 22 08/09/2021   NA 140 08/09/2021   K 4.4 08/09/2021   CL 105 08/09/2021   CREATININE 1.33 (H) 08/09/2021   BUN 20 08/09/2021   CO2 28 08/09/2021   TSH 2.55 06/05/2021   PSA 0.00 (L) 06/01/2019   HGBA1C 5.2 09/16/2014    Assessment/Plan:  Candidal balanitis  - Plan: clotrimazole (ANTIFUNGAL, CLOTRIMAZOLE,) 1 % cream, fluconazole (DIFLUCAN) 150 MG tablet -Consider ABX for continued or worsening symptoms -Given precautions  F/u as needed  Grier Mitts, MD

## 2021-09-02 DIAGNOSIS — H401431 Capsular glaucoma with pseudoexfoliation of lens, bilateral, mild stage: Secondary | ICD-10-CM | POA: Diagnosis not present

## 2021-09-12 ENCOUNTER — Ambulatory Visit (INDEPENDENT_AMBULATORY_CARE_PROVIDER_SITE_OTHER): Payer: Medicare Other | Admitting: Family Medicine

## 2021-09-12 ENCOUNTER — Encounter: Payer: Self-pay | Admitting: Family Medicine

## 2021-09-12 VITALS — BP 110/74 | HR 73 | Temp 98.5°F | Wt 233.0 lb

## 2021-09-12 DIAGNOSIS — B3742 Candidal balanitis: Secondary | ICD-10-CM | POA: Diagnosis not present

## 2021-09-12 NOTE — Progress Notes (Signed)
Subjective:    Patient ID: Gary Osborn, male    DOB: 11-May-1933, 87 y.o.   MRN: [id number]  Chief Complaint  Patient presents with   Follow-up    Yeast infection on head of penis. Not completely sure it is getting better. Tried to look at it yesterday in mirror, not sure if it is working. Was given a pill to take.    HPI Patient was seen today for follow-up on candidal pharyngitis.  Patient took 1 dose of Diflucan and using nystatin cream and powder.  Unable to visualize and denies pain.  Symptoms previously noted in outside doctor's visit when patient noticed penis was sensitive on exam.  Patient denies fever, chills, nausea, vomiting, pain, dysuria.  Endorses mild loose stools for the last 2 days.  Plans to pick up Imodium from store.  Past Medical History:  Diagnosis Date   ALLERGIC RHINITIS 10/05/2007   ANXIETY 10/05/2007   COLONIC POLYPS, HX OF 10/05/2007   DEPRESSION 10/04/2008   PROSTATE CANCER, UNSPEC. 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    Allergies  Allergen Reactions   Cats Claw [Uncaria Tomentosa (Cats Claw)]     PT IS ALLERGIC TO CATS/ DOES NOT HAVE ANY    ROS General: Denies fever, chills, night sweats, changes in weight, changes in appetite HEENT: Denies headaches, ear pain, changes in vision, rhinorrhea, sore throat CV: Denies CP, palpitations, SOB, orthopnea Pulm: Denies SOB, cough, wheezing GI: Denies abdominal pain, nausea, vomiting, diarrhea, constipation GU: Denies dysuria, hematuria, frequency +balantitis Msk: Denies muscle cramps, joint pains Neuro: Denies weakness, numbness, tingling Skin: Denies rashes, bruising Psych: Denies depression, anxiety, hallucinations      Objective:    Blood pressure 110/74, pulse 73, temperature 98.5 F (36.9 C), temperature source Oral, weight 233 lb (105.7 kg), SpO2 95 %.   Gen. Pleasant, well-nourished, in no distress, normal affect   HEENT: [place]/AT, face symmetric, conjunctiva clear, no scleral icterus,  PERRLA, EOMI, nares patent without drainage Lungs: no accessory muscle use Cardiovascular: RRR, no peripheral edema GU: Normal-appearing external male genitalia, uncircumcised.  Foreskin retracted, erythema on dorsum of glans penis. No smegma or erythema of foreskin or penile shaft. Musculoskeletal: No deformities, no cyanosis or clubbing, normal tone Neuro:  A&Ox3, CN II-XII intact, normal gait  Wt Readings from Last 3 Encounters:  09/12/21 233 lb (105.7 kg)  08/28/21 234 lb 12.8 oz (106.5 kg)  08/19/21 233 lb 6.4 oz (105.9 kg)    Lab Results  Component Value Date   WBC 3.3 (L) 08/09/2021   HGB 13.2 08/09/2021   HCT 40.1 08/09/2021   PLT 161 08/09/2021   GLUCOSE 93 08/09/2021   CHOL 125 06/05/2021   TRIG 84.0 06/05/2021   HDL 44.30 06/05/2021   LDLCALC 64 06/05/2021   ALT 18 08/09/2021   AST 22 08/09/2021   NA 140 08/09/2021   K 4.4 08/09/2021   CL 105 08/09/2021   CREATININE 1.33 (H) 08/09/2021   BUN 20 08/09/2021   CO2 28 08/09/2021   TSH 2.55 06/05/2021   PSA 0.00 (L) 06/01/2019   HGBA1C 5.2 09/16/2014    Assessment/Plan:  Candidal balanitis -discussed starting abx to cover secondary bacterial infection.  Pt wishes to wait for a wk.  Will see if his wife will assist with making sure ointment is applied correctly. -Will continue nystatin cream -Hygiene reviewed.  Patient encouraged to keep area clean and dry. -Given strict precautions  F/u in 1-2 weeks, sooner if needed  Grier Mitts, MD

## 2021-09-13 ENCOUNTER — Other Ambulatory Visit: Payer: Self-pay | Admitting: Family Medicine

## 2021-09-13 DIAGNOSIS — E039 Hypothyroidism, unspecified: Secondary | ICD-10-CM

## 2021-10-24 DIAGNOSIS — F251 Schizoaffective disorder, depressive type: Secondary | ICD-10-CM | POA: Diagnosis not present

## 2021-10-25 ENCOUNTER — Telehealth: Payer: Self-pay | Admitting: Family Medicine

## 2021-10-25 DIAGNOSIS — B3742 Candidal balanitis: Secondary | ICD-10-CM

## 2021-10-25 NOTE — Telephone Encounter (Signed)
Called pt, no answer, left vm to call office about his appt. Referral has been placed for Urology, they will be reaching out to schedule appointment. Upcoming visit can be canceled.

## 2021-10-25 NOTE — Telephone Encounter (Signed)
Patient called in stating that he has an antifungal cream that Dr.Banks prescribed to him a while ago. Patient stated that since he gained weight he cannot see his penis. The patient stated that another provider said he have another fungal type infection so he wants to come down so that Dr.Banks could take a look at it. Patient is requesting to be referred to Dr.Wrenn with [facility] if she doesn't know what's going on.  Patient will be coming in on Wednesday, January 18th at 11am.  Please advise.

## 2021-10-28 DIAGNOSIS — N393 Stress incontinence (female) (male): Secondary | ICD-10-CM | POA: Diagnosis not present

## 2021-10-28 DIAGNOSIS — N481 Balanitis: Secondary | ICD-10-CM | POA: Diagnosis not present

## 2021-10-28 DIAGNOSIS — C61 Malignant neoplasm of prostate: Secondary | ICD-10-CM | POA: Diagnosis not present

## 2021-10-28 DIAGNOSIS — E291 Testicular hypofunction: Secondary | ICD-10-CM | POA: Diagnosis not present

## 2021-10-28 NOTE — Telephone Encounter (Signed)
Pt is aware urology referral has been place and he is aware wed appt has been cancelled

## 2021-10-30 ENCOUNTER — Ambulatory Visit: Payer: Medicare Other | Admitting: Family Medicine

## 2021-11-13 ENCOUNTER — Other Ambulatory Visit: Payer: Self-pay | Admitting: Family Medicine

## 2021-11-13 DIAGNOSIS — E039 Hypothyroidism, unspecified: Secondary | ICD-10-CM

## 2021-12-16 ENCOUNTER — Other Ambulatory Visit: Payer: Self-pay | Admitting: Endocrinology

## 2021-12-19 IMAGING — US US ABDOMEN COMPLETE W/ ELASTOGRAPHY
1 series · 12 of 25 positions shown · non-contrast
Comparison: None

CLINICAL DATA: Leukopenia, suspected cirrhosis

EXAM:
ULTRASOUND ABDOMEN
ULTRASOUND HEPATIC ELASTOGRAPHY
TECHNIQUE: Sonography of the upper abdomen was performed. In addition,
ultrasound elastography evaluation of the liver was performed. A
region of interest was placed within the right lobe of the liver.
Following application of a compressive sonographic pulse, tissue
compressibility was assessed. Multiple assessments were performed at
the selected site. Median tissue compressibility was determined.
Previously, hepatic stiffness was assessed by shear wave velocity.
Based on recently published Society of Radiologists in Ultrasound
consensus article, reporting is now recommended to be performed in
the SI units of pressure (kiloPascals) representing hepatic
stiffness/elasticity. The obtained result is compared to the
published reference standards. (cACLD = compensated Advanced Chronic
Liver Disease)

[Series 1: us abdomen complete · 12 of 97 slices shown]
[im 5/97]
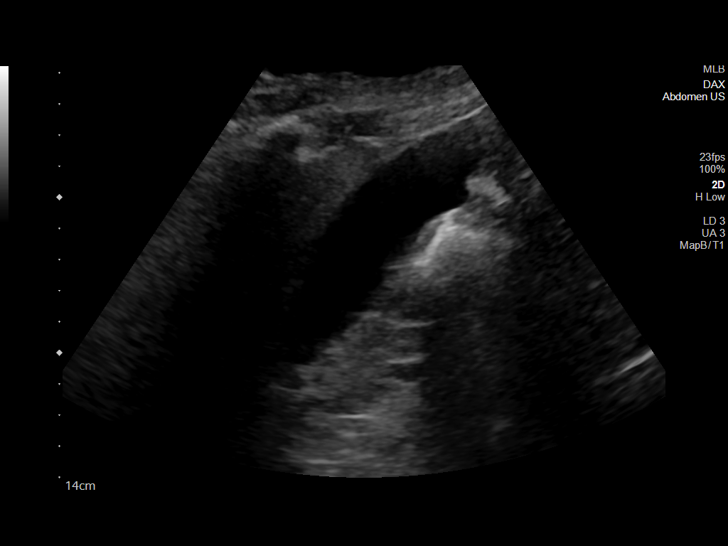
[im 13/97]
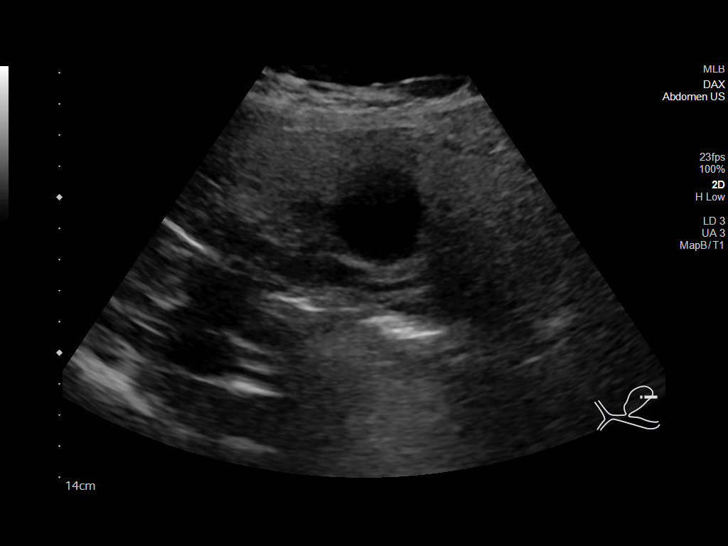
[im 21/97]
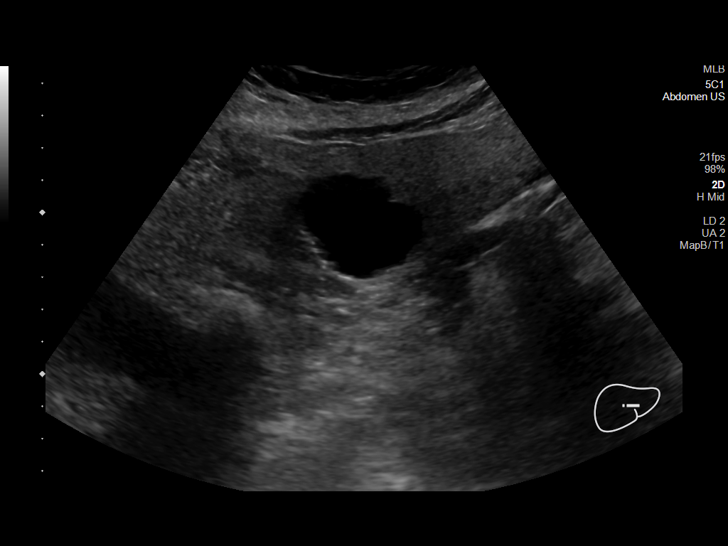
[im 29/97]
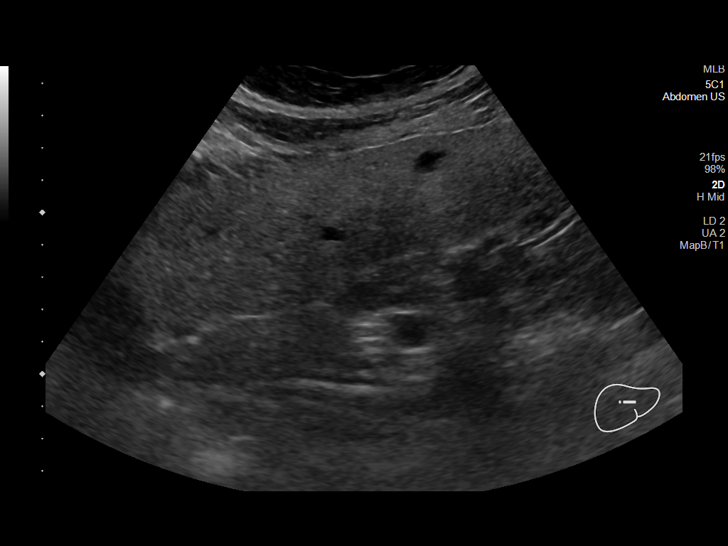
[im 37/97]
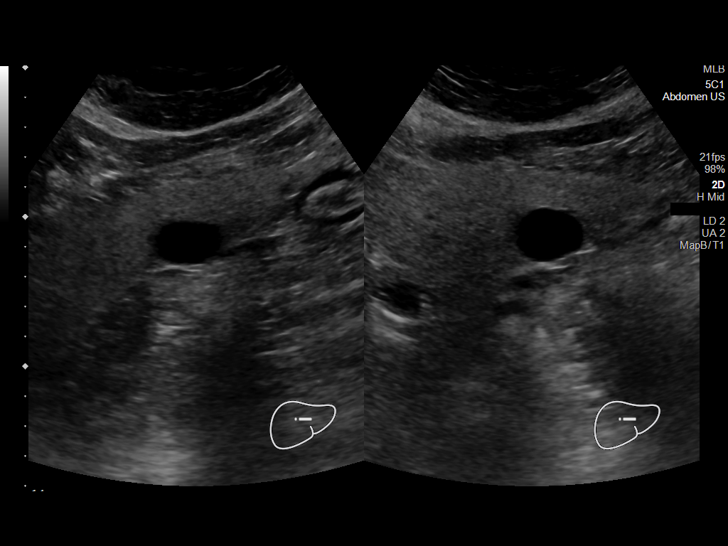
[im 45/97]
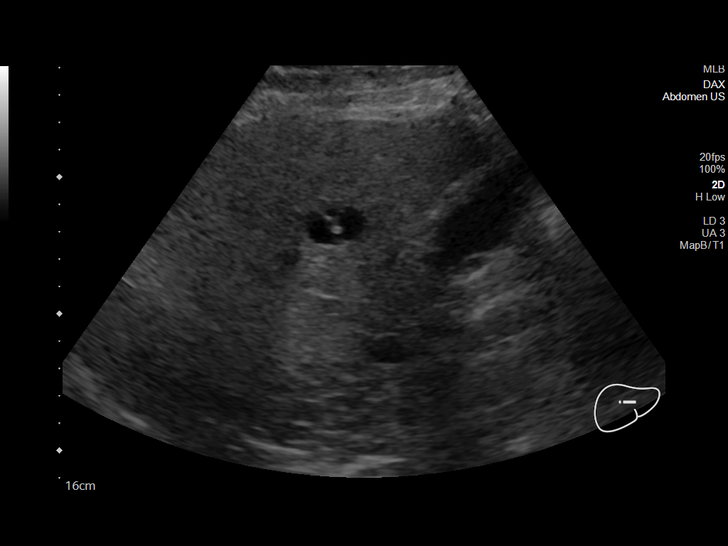
[im 53/97]
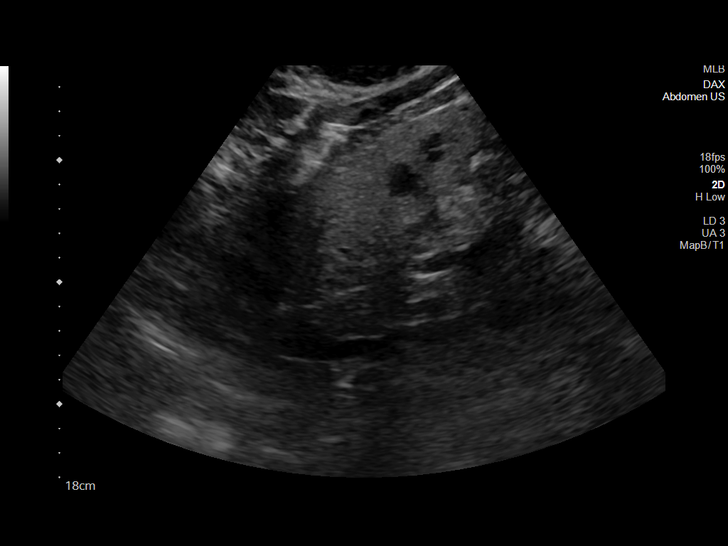
[im 61/97]
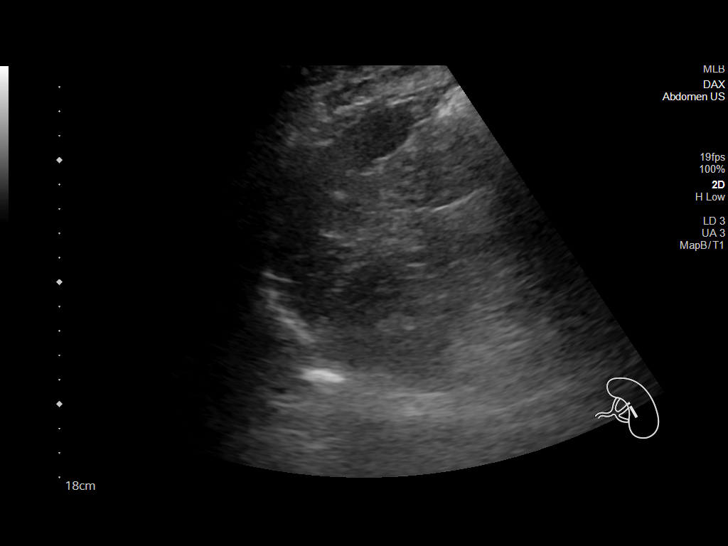
[im 69/97]
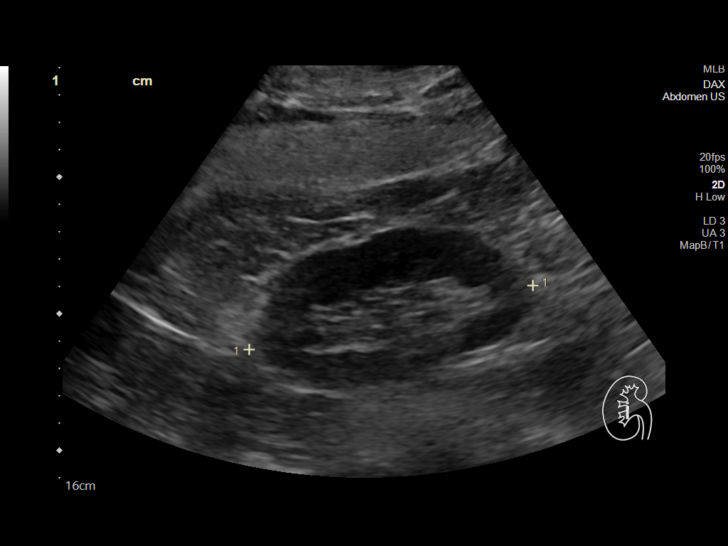
[im 77/97]
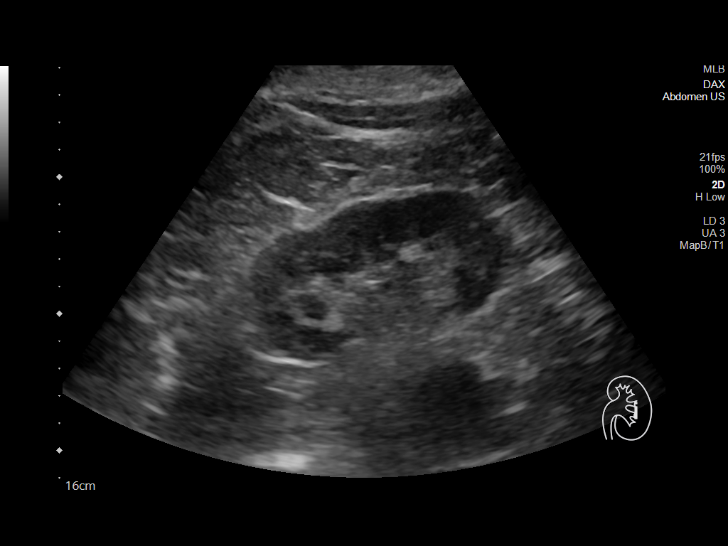
[im 85/97]
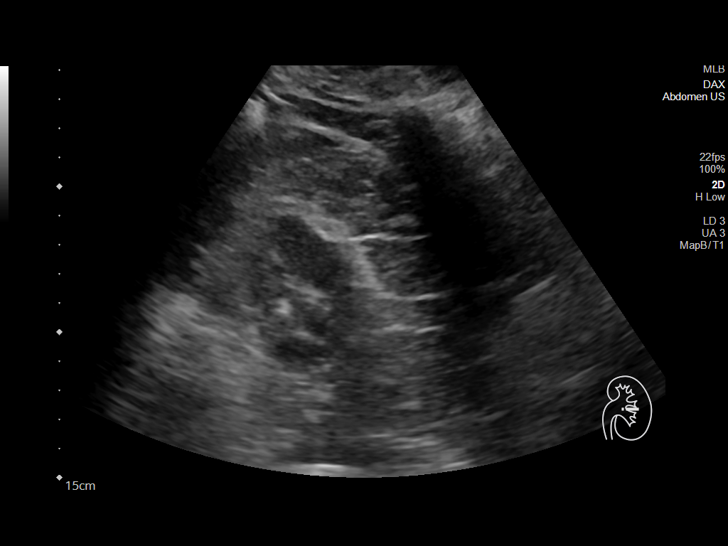
[im 93/97]
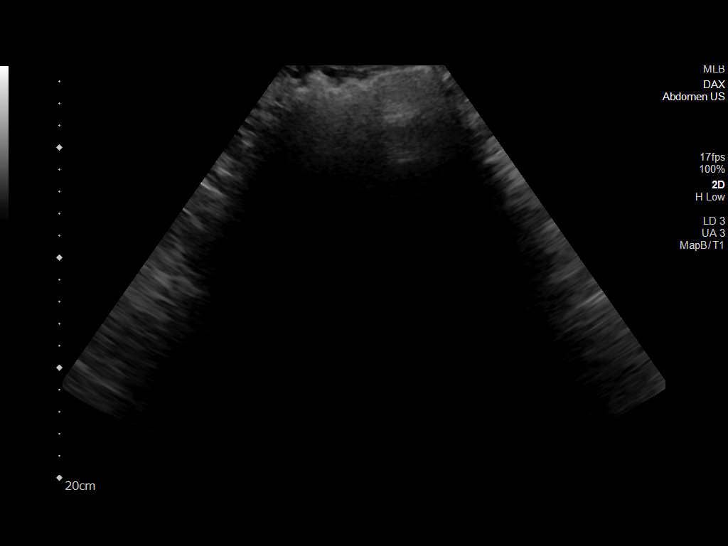

[12 of 25 positions shown; findings below may reference images not displayed]

FINDINGS: ULTRASOUND ABDOMEN

Gallbladder: Normally distended without stones or wall thickening.
No pericholecystic fluid or sonographic Murphy sign.

Common bile duct: Diameter: 4 mm, normal

Liver: Heterogeneous increased echogenicity. Cyst identified LEFT
lobe 2.9 x 3.2 x 3.1 cm. Additional small cyst LEFT lobe 2.3 x 1.5 x
2.2 cm. Two adjacent tiny cysts within RIGHT lobe. No solid mass.
Portal vein is patent on color Doppler imaging with normal direction
of blood flow towards the liver.

IVC: Normal appearance

Pancreas: Obscured by bowel gas

Spleen: Small and atrophic in appearance, 5.5 cm length

Right Kidney: Length: 10.6 cm. Normal morphology without mass or
hydronephrosis.

Left Kidney: Length: 10.3 cm. Normal morphology without mass or
hydronephrosis.

Abdominal aorta: Normal caliber

Other findings: Cyst identified at the region of the near the
midline 4.5 x 4.0 x 4.2 cm, of uncertain etiology.

ULTRASOUND HEPATIC ELASTOGRAPHY

Device: Siemens Helix VTQ

Patient position: Supine

Transducer 5C1

Number of measurements: 10

Hepatic segment:  8

Median kPa:

IQR:

IQR/Median kPa ratio:

Data quality:  Good

Diagnostic category:  < or = 5 kPa: high probability of being normal

The use of hepatic elastography is applicable to patients with viral
hepatitis and non-alcoholic fatty liver disease. At this time, there
is insufficient data for the referenced cut-off values and use in
other causes of liver disease, including alcoholic liver disease.
Patients, however, may be assessed by elastography and serve as
their own reference standard/baseline.

In patients with non-alcoholic liver disease, the values suggesting
compensated advanced chronic liver disease (cACLD) may be lower, and
patients may need additional testing with elasticity results of [DATE]
kPa.

Please note that abnormal hepatic elasticity and shear wave
velocities may also be identified in clinical settings other than
with hepatic fibrosis, such as: acute hepatitis, elevated right
heart and central venous pressures including use of beta blockers,
Chau disease (Shayeste), infiltrative processes such as
mastocytosis/amyloidosis/infiltrative tumor/lymphoma, extrahepatic
cholestasis, with hyperemia in the post-prandial state, and with
liver transplantation. Correlation with patient history, laboratory
data, and clinical condition recommended.

Diagnostic Categories:

< or =5 kPa: high probability of being normal

< or =9 kPa: in the absence of other known clinical signs, rules [DATE] kPa and ?13 kPa: suggestive of cACLD, but needs further testing

>13 kPa: highly suggestive of cACLD

> or =17 kPa: highly suggestive of cACLD with an increased
probability of clinically significant portal hypertension
IMPRESSION: ULTRASOUND ABDOMEN:

Probable fatty infiltration of liver with multiple hepatic cysts.

Cyst near pancreas at midline, 4.5 cm greatest size, of uncertain
etiology; this could be better assessed by follow-up CT imaging.

Remaining visualized upper abdomen unremarkable.

ULTRASOUND HEPATIC ELASTOGRAPHY:

Median kPa:

Diagnostic category:  < or = 5 kPa: high probability of being normal

## 2021-12-24 DIAGNOSIS — L814 Other melanin hyperpigmentation: Secondary | ICD-10-CM | POA: Diagnosis not present

## 2021-12-24 DIAGNOSIS — L219 Seborrheic dermatitis, unspecified: Secondary | ICD-10-CM | POA: Diagnosis not present

## 2021-12-24 DIAGNOSIS — D1801 Hemangioma of skin and subcutaneous tissue: Secondary | ICD-10-CM | POA: Diagnosis not present

## 2021-12-24 DIAGNOSIS — L7211 Pilar cyst: Secondary | ICD-10-CM | POA: Diagnosis not present

## 2021-12-24 DIAGNOSIS — L821 Other seborrheic keratosis: Secondary | ICD-10-CM | POA: Diagnosis not present

## 2021-12-24 DIAGNOSIS — L988 Other specified disorders of the skin and subcutaneous tissue: Secondary | ICD-10-CM | POA: Diagnosis not present

## 2021-12-24 DIAGNOSIS — L859 Epidermal thickening, unspecified: Secondary | ICD-10-CM | POA: Diagnosis not present

## 2021-12-24 DIAGNOSIS — D225 Melanocytic nevi of trunk: Secondary | ICD-10-CM | POA: Diagnosis not present

## 2021-12-24 DIAGNOSIS — L570 Actinic keratosis: Secondary | ICD-10-CM | POA: Diagnosis not present

## 2021-12-31 DIAGNOSIS — H401431 Capsular glaucoma with pseudoexfoliation of lens, bilateral, mild stage: Secondary | ICD-10-CM | POA: Diagnosis not present

## 2022-01-08 DIAGNOSIS — K1329 Other disturbances of oral epithelium, including tongue: Secondary | ICD-10-CM | POA: Diagnosis not present

## 2022-01-16 ENCOUNTER — Encounter: Payer: Self-pay | Admitting: Endocrinology

## 2022-01-16 ENCOUNTER — Ambulatory Visit (INDEPENDENT_AMBULATORY_CARE_PROVIDER_SITE_OTHER): Payer: Medicare Other | Admitting: Endocrinology

## 2022-01-16 VITALS — BP 112/76 | HR 93 | Ht 69.0 in | Wt 224.6 lb

## 2022-01-16 DIAGNOSIS — N62 Hypertrophy of breast: Secondary | ICD-10-CM

## 2022-01-16 NOTE — Patient Instructions (Addendum)
You can stop taking the Tamoxifen.   ?Losing weight also helps reduce the breast swelling.   ?Best wishes on your future health.   ?

## 2022-01-16 NOTE — Progress Notes (Signed)
? ?Subjective:  ? ? Patient ID: Gary Osborn, male    DOB: [birth year], 88 y.o.   MRN: [id number] ? ?HPI ?Pt returns for f/u of low testosterone (he had 2 biological children; he had surgical reduction of prominent nipples in approx 1980; he was rx'ed tamoxifen for bilat breast growth; he can't have rx for testosterone, due to h/o prostate ca; urol released him from their care in 2021; gonadotropins are menopausal; reason for low testosterone is unknown, as he never had orchiectomy).   ?Past Medical History:  ?Diagnosis Date  ? ALLERGIC RHINITIS 10/05/2007  ? ANXIETY 10/05/2007  ? COLONIC POLYPS, HX OF 10/05/2007  ? DEPRESSION 10/04/2008  ? PROSTATE CANCER, UNSPEC. 10/05/2007  ? 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 ? ?Past Surgical History:  ?Procedure Laterality Date  ? NASAL SINUS SURGERY    ? PROSTATE SURGERY    ? singulotomy    ? for OCD  ? TONSILLECTOMY    ? ? ?Social History  ? ?Socioeconomic History  ? Marital status: Married  ?  Spouse name: Not on file  ? Number of children: 2  ? Years of education: Not on file  ? Highest education level: Not on file  ?Occupational History  ? Occupation: Retired  ?  Employer: RETIRED  ?Tobacco Use  ? Smoking status: Former  ?  Types: Cigarettes  ?  Quit date: 10/13/1973  ?  Years since quitting: 48.2  ? Smokeless tobacco: Never  ?Substance and Sexual Activity  ? Alcohol use: Yes  ?  Alcohol/week: 28.0 standard drinks  ?  Types: 14 Cans of beer, 14 Standard drinks or equivalent per week  ?  Comment: glass of chardonnay each night  ? Drug use: No  ? Sexual activity: Not on file  ?Other Topics Concern  ? Not on file  ?Social History Narrative  ? Not on file  ? ?Social Determinants of Health  ? ?Financial Resource Strain: Not on file  ?Food Insecurity: Not on file  ?Transportation Needs: Not on file  ?Physical Activity: Not on file  ?Stress: Not on file  ?Social Connections: Not on file  ?Intimate Partner Violence: Not on file  ? ? ?Current Outpatient Medications on File Prior to Visit   ?Medication Sig Dispense Refill  ? busPIRone (BUSPAR) 10 MG tablet Take 10 mg by mouth 2 (two) times daily. Take 2 tabs twice daily    ? clonazePAM (KLONOPIN) 0.5 MG tablet Take 1 tablet (0.5 mg total) by mouth 2 (two) times daily as needed. For severe anxiety 8 tablet 0  ? clotrimazole (ANTIFUNGAL, CLOTRIMAZOLE,) 1 % cream Apply 1 application topically 2 (two) times daily. 30 g 0  ? docusate sodium (COLACE) 100 MG capsule Take 100 mg by mouth daily.    ? dorzolamide-timolol (COSOPT) 22.3-6.8 MG/ML ophthalmic solution Place 1 drop into both eyes 2 (two) times daily.    ? hydrocortisone (PROCTOSOL HC) 2.5 % rectal cream Place 1 application rectally 2 (two) times daily. 30 g 0  ? levothyroxine (SYNTHROID) 50 MCG tablet Take 1 tablet (50 mcg total) by mouth daily before breakfast. 90 tablet 1  ? mirtazapine (REMERON) 15 MG tablet Take 15 mg by mouth at bedtime.    ? mirtazapine (REMERON) 45 MG tablet Take 0.5 tablets (22.5 mg total) by mouth at bedtime. For depression 30 tablet 0  ? Multiple Vitamins-Minerals (CENTRUM SILVER PO) Take by mouth daily.    ? nystatin-triamcinolone ointment (MYCOLOG) Apply 1 application topically 2 (two) times daily. 30 g  0  ? PARoxetine (PAXIL) 40 MG tablet Take 1 tablet (40 mg total) by mouth at bedtime. For depression 30 tablet 0  ? ?No current facility-administered medications on file prior to visit.  ? ? ?Allergies  ?Allergen Reactions  ? Cats Claw [Uncaria Tomentosa (Cats Claw)]   ?  PT IS ALLERGIC TO CATS/ DOES NOT HAVE ANY  ? ? ?Family History  ?Problem Relation Age of Onset  ? Prostate cancer Father   ? Breast cancer Sister   ? ? ?BP 112/76 (BP Location: Left Arm, Patient Position: Sitting, Cuff Size: Normal)   Pulse 93   Ht '5\' 9"'$  (1.753 m)   Wt 224 lb 9.6 oz (101.9 kg)   SpO2 96%   BMI 33.17 kg/m?  ? ? ?Review of Systems ?Denies decreased urinary stream.   ?   ?Objective:  ? Physical Exam ?VITAL SIGNS:  See vs page.   ?GENERAL: no distress.   ?BREASTS: moderate bilat  pseudogynecomastia, but no palpable ductal tissue.   ? ? ?Lab Results  ?Component Value Date  ? TSH 2.55 06/05/2021  ? T3TOTAL 71.7 (L) 09/16/2014  ? ?Lab Results  ?Component Value Date  ? TESTOSTERONE 14 (L) 06/04/2020  ? ?   ?Assessment & Plan:  ?Gynecomastia.  We discussed.  Pt requests to have a trial off tamoxifen--OK with me ? ?

## 2022-01-27 DIAGNOSIS — L988 Other specified disorders of the skin and subcutaneous tissue: Secondary | ICD-10-CM | POA: Diagnosis not present

## 2022-01-27 DIAGNOSIS — D225 Melanocytic nevi of trunk: Secondary | ICD-10-CM | POA: Diagnosis not present

## 2022-01-27 DIAGNOSIS — L918 Other hypertrophic disorders of the skin: Secondary | ICD-10-CM | POA: Diagnosis not present

## 2022-01-27 DIAGNOSIS — L219 Seborrheic dermatitis, unspecified: Secondary | ICD-10-CM | POA: Diagnosis not present

## 2022-01-27 DIAGNOSIS — L304 Erythema intertrigo: Secondary | ICD-10-CM | POA: Diagnosis not present

## 2022-01-27 DIAGNOSIS — L570 Actinic keratosis: Secondary | ICD-10-CM | POA: Diagnosis not present

## 2022-01-27 DIAGNOSIS — L7211 Pilar cyst: Secondary | ICD-10-CM | POA: Diagnosis not present

## 2022-01-29 DIAGNOSIS — N393 Stress incontinence (female) (male): Secondary | ICD-10-CM | POA: Diagnosis not present

## 2022-01-29 DIAGNOSIS — N481 Balanitis: Secondary | ICD-10-CM | POA: Diagnosis not present

## 2022-02-07 ENCOUNTER — Inpatient Hospital Stay: Payer: Medicare Other | Attending: Oncology

## 2022-02-07 ENCOUNTER — Inpatient Hospital Stay (HOSPITAL_BASED_OUTPATIENT_CLINIC_OR_DEPARTMENT_OTHER): Payer: Medicare Other | Admitting: Oncology

## 2022-02-07 VITALS — BP 139/73 | HR 100 | Temp 97.8°F | Resp 18 | Ht 69.0 in | Wt 222.2 lb

## 2022-02-07 DIAGNOSIS — D72819 Decreased white blood cell count, unspecified: Secondary | ICD-10-CM

## 2022-02-07 DIAGNOSIS — E039 Hypothyroidism, unspecified: Secondary | ICD-10-CM | POA: Diagnosis not present

## 2022-02-07 DIAGNOSIS — N62 Hypertrophy of breast: Secondary | ICD-10-CM | POA: Diagnosis not present

## 2022-02-07 DIAGNOSIS — N481 Balanitis: Secondary | ICD-10-CM | POA: Insufficient documentation

## 2022-02-07 DIAGNOSIS — Z79810 Long term (current) use of selective estrogen receptor modulators (SERMs): Secondary | ICD-10-CM | POA: Insufficient documentation

## 2022-02-07 DIAGNOSIS — Z8546 Personal history of malignant neoplasm of prostate: Secondary | ICD-10-CM | POA: Insufficient documentation

## 2022-02-07 LAB — CBC WITH DIFFERENTIAL (CANCER CENTER ONLY)
Abs Immature Granulocytes: 0.01 10*3/uL (ref 0.00–0.07)
Basophils Absolute: 0 10*3/uL (ref 0.0–0.1)
Basophils Relative: 1 %
Eosinophils Absolute: 0.1 10*3/uL (ref 0.0–0.5)
Eosinophils Relative: 2 %
HCT: 40 % (ref 39.0–52.0)
Hemoglobin: 13.2 g/dL (ref 13.0–17.0)
Immature Granulocytes: 0 %
Lymphocytes Relative: 27 %
Lymphs Abs: 1.1 10*3/uL (ref 0.7–4.0)
MCH: 32.8 pg (ref 26.0–34.0)
MCHC: 33 g/dL (ref 30.0–36.0)
MCV: 99.3 fL (ref 80.0–100.0)
Monocytes Absolute: 0.4 10*3/uL (ref 0.1–1.0)
Monocytes Relative: 11 %
Neutro Abs: 2.3 10*3/uL (ref 1.7–7.7)
Neutrophils Relative %: 59 %
Platelet Count: 156 10*3/uL (ref 150–400)
RBC: 4.03 MIL/uL — ABNORMAL LOW (ref 4.22–5.81)
RDW: 13 % (ref 11.5–15.5)
WBC Count: 4 10*3/uL (ref 4.0–10.5)
nRBC: 0 % (ref 0.0–0.2)

## 2022-02-07 NOTE — Progress Notes (Signed)
?  [facility] ?OFFICE PROGRESS NOTE ? ? ?Diagnosis: Leukopenia ? ?INTERVAL HISTORY:  ? ?Mr. Deemer returns as scheduled.  He feels well.  He relates weight loss to not drinking beer.  He is seeing Dr. Jeffie Pollock for treatment of Balanitus. The Balanitus has improved.  No fever or night sweats.  No recent infection. ?He had a recent biopsy of a lesion at the left side of the tongue.  He has been referred to a surgeon for a "precancerous "lesion. ?Objective: ? ?Vital signs in last 24 hours: ? ?Blood pressure 139/73, pulse 100, temperature 97.8 ?F (36.6 ?C), temperature source Oral, resp. rate 18, height '5\' 9"'$  (1.753 m), weight 222 lb 3.2 oz (100.8 kg), SpO2 100 %. ?  ? ?HEENT: Biopsy site at the posterior left tongue ?Lymphatics: No cervical, supraclavicular, axillary, or inguinal nodes ?Resp: Lungs clear bilaterally ?Cardio: Regular rhythm with premature beats ?GI: No hepatosplenomegaly ?Vascular: No leg edema ?GU: Few erythematous areas at the head of the penis, mild yeast rash in the groin ? ? ?Lab Results: ? ?Lab Results  ?Component Value Date  ? WBC 4.0 02/07/2022  ? HGB 13.2 02/07/2022  ? HCT 40.0 02/07/2022  ? MCV 99.3 02/07/2022  ? PLT 156 02/07/2022  ? NEUTROABS 2.3 02/07/2022  ? ? ?CMP  ?Lab Results  ?Component Value Date  ? NA 140 08/09/2021  ? K 4.4 08/09/2021  ? CL 105 08/09/2021  ? CO2 28 08/09/2021  ? GLUCOSE 93 08/09/2021  ? BUN 20 08/09/2021  ? CREATININE 1.33 (H) 08/09/2021  ? CALCIUM 9.1 08/09/2021  ? PROT 6.6 08/09/2021  ? ALBUMIN 4.1 08/09/2021  ? AST 22 08/09/2021  ? ALT 18 08/09/2021  ? ALKPHOS 66 08/09/2021  ? BILITOT 0.6 08/09/2021  ? GFRNONAA 52 (L) 08/09/2021  ? GFRAA 78 06/04/2020  ? ? ?Medications: I have reviewed the patient's current medications. ? ? ?Assessment/Plan: ?Mild chronic leukopenia ?Gynecomastia on tamoxifen ?Anxiety/depression ?Hypothyroid ?Alcohol use ?Black stools ?Remote history of prostate cancer ?Probable balanitis-persistent on exam 08/09/2021 ?Abdominal  ultrasound 07/17/2021-evidence of fatty liver, midline "cyst "of uncertain etiology, normal direction of flow from portal vein, no splenomegaly ?Balanitis ? ? ? ?Disposition: ?Mr. Carrizales was referred for evaluation of leukopenia.  The white count is normal today.  I do not recommend further evaluation at present. ?He will return for an office visit and CBC in 8 months.  He will follow-up with oral surgery for the tongue lesion. ? ?Betsy Coder, MD ? ?02/07/2022  ?11:32 AM ? ? ?

## 2022-02-18 DIAGNOSIS — D225 Melanocytic nevi of trunk: Secondary | ICD-10-CM | POA: Diagnosis not present

## 2022-02-18 DIAGNOSIS — D1800 Hemangioma unspecified site: Secondary | ICD-10-CM | POA: Diagnosis not present

## 2022-02-18 DIAGNOSIS — L304 Erythema intertrigo: Secondary | ICD-10-CM | POA: Diagnosis not present

## 2022-02-18 DIAGNOSIS — L814 Other melanin hyperpigmentation: Secondary | ICD-10-CM | POA: Diagnosis not present

## 2022-02-18 DIAGNOSIS — L821 Other seborrheic keratosis: Secondary | ICD-10-CM | POA: Diagnosis not present

## 2022-02-18 DIAGNOSIS — L219 Seborrheic dermatitis, unspecified: Secondary | ICD-10-CM | POA: Diagnosis not present

## 2022-02-18 DIAGNOSIS — L7211 Pilar cyst: Secondary | ICD-10-CM | POA: Diagnosis not present

## 2022-02-18 DIAGNOSIS — L570 Actinic keratosis: Secondary | ICD-10-CM | POA: Diagnosis not present

## 2022-02-18 DIAGNOSIS — L820 Inflamed seborrheic keratosis: Secondary | ICD-10-CM | POA: Diagnosis not present

## 2022-02-18 DIAGNOSIS — L918 Other hypertrophic disorders of the skin: Secondary | ICD-10-CM | POA: Diagnosis not present

## 2022-02-19 ENCOUNTER — Telehealth: Payer: Self-pay

## 2022-02-19 NOTE — Telephone Encounter (Signed)
TC from Gary Osborn stated he mail a letter to Dr. Benay Spice about doing oral surgery. I discussed this matter with Dr. Benay Spice and he advised the patient to speak with the oral surgeon about his concerns. Patient gave verbal understanding  ?

## 2022-02-28 ENCOUNTER — Telehealth: Payer: Self-pay

## 2022-02-28 DIAGNOSIS — K1379 Other lesions of oral mucosa: Secondary | ICD-10-CM | POA: Diagnosis not present

## 2022-02-28 NOTE — Telephone Encounter (Signed)
Maggie from [facility]'s office requested a pathology for a tongue lesion. I explain to [facility] our office does not have the report and reach put to the surgery. I called and left a message  to let her know, she can also call [facility] at [phone] for the pathology report.

## 2022-03-24 DIAGNOSIS — F251 Schizoaffective disorder, depressive type: Secondary | ICD-10-CM | POA: Diagnosis not present

## 2022-04-29 DIAGNOSIS — K1379 Other lesions of oral mucosa: Secondary | ICD-10-CM | POA: Diagnosis not present

## 2022-05-05 DIAGNOSIS — N393 Stress incontinence (female) (male): Secondary | ICD-10-CM | POA: Diagnosis not present

## 2022-05-05 DIAGNOSIS — N481 Balanitis: Secondary | ICD-10-CM | POA: Diagnosis not present

## 2022-05-06 DIAGNOSIS — H401431 Capsular glaucoma with pseudoexfoliation of lens, bilateral, mild stage: Secondary | ICD-10-CM | POA: Diagnosis not present

## 2022-05-20 DIAGNOSIS — N481 Balanitis: Secondary | ICD-10-CM | POA: Diagnosis not present

## 2022-05-23 ENCOUNTER — Other Ambulatory Visit: Payer: Self-pay | Admitting: Family Medicine

## 2022-05-23 DIAGNOSIS — N481 Balanitis: Secondary | ICD-10-CM | POA: Diagnosis not present

## 2022-05-23 DIAGNOSIS — E039 Hypothyroidism, unspecified: Secondary | ICD-10-CM

## 2022-06-06 ENCOUNTER — Ambulatory Visit (INDEPENDENT_AMBULATORY_CARE_PROVIDER_SITE_OTHER): Payer: Medicare Other

## 2022-06-06 VITALS — Ht 69.0 in | Wt 222.0 lb

## 2022-06-06 DIAGNOSIS — Z0000 Encounter for general adult medical examination without abnormal findings: Secondary | ICD-10-CM | POA: Diagnosis not present

## 2022-06-06 NOTE — Progress Notes (Signed)
Subjective:   Gary Osborn is a 88 y.o. male who presents for Medicare Annual/Subsequent preventive examination.  Review of Systems    Virtual Visit via Telephone Note  I connected with  COLIE FUGITT on 06/06/22 at 12:30 PM EDT by telephone and verified that I am speaking with the correct person using two identifiers.  Location: Patient: Home Provider: Office Persons participating in the virtual visit: patient/Nurse Health Advisor   I discussed the limitations, risks, security and privacy concerns of performing an evaluation and management service by telephone and the availability of in person appointments. The patient expressed understanding and agreed to proceed.  Interactive audio and video telecommunications were attempted between this nurse and patient, however failed, due to patient having technical difficulties OR patient did not have access to video capability.  We continued and completed visit with audio only.  Some vital signs may be absent or patient reported.   Criselda Peaches, LPN  Cardiac Risk Factors include: advanced age (>11mn, >>17women)     Objective:    Today's Vitals   06/06/22 1237  Weight: 222 lb (100.7 kg)  Height: '5\' 9"'$  (1.753 m)   Body mass index is 32.78 kg/m.     06/06/2022   12:48 PM 08/09/2021   11:06 AM 06/05/2021    9:05 AM 06/12/2020    9:52 AM 09/14/2014    6:00 PM 09/08/2014    9:15 AM 01/05/2014    7:58 PM  Advanced Directives  Does Patient Have a Medical Advance Directive? Yes Yes Yes Yes  No   Type of [place]amed[place]of [place] will [place] will  [place] will     Does patient want to make changes to medical advance directive? No - Patient declined   No - Patient declined     Copy of HGrafordin Chart? No - copy requested No - copy requested  No - copy requested     Would patient like information on creating a medical advance  directive?      No - patient declined information      Information is confidential and restricted. Go to Review Flowsheets to unlock data.    Current Medications (verified) Outpatient Encounter Medications as of 06/06/2022  Medication Sig   busPIRone (BUSPAR) 10 MG tablet Take 10 mg by mouth 2 (two) times daily. Take 2 tabs twice daily   clonazePAM (KLONOPIN) 0.5 MG tablet Take 1 tablet (0.5 mg total) by mouth 2 (two) times daily as needed. For severe anxiety   clotrimazole (ANTIFUNGAL, CLOTRIMAZOLE,) 1 % cream Apply 1 application topically 2 (two) times daily.   clotrimazole-betamethasone (LOTRISONE) cream SMARTSIG:sparingly Topical Twice Daily   docusate sodium (COLACE) 100 MG capsule Take 100 mg by mouth daily.   dorzolamide-timolol (COSOPT) 22.3-6.8 MG/ML ophthalmic solution Place 1 drop into both eyes 2 (two) times daily.   levothyroxine (SYNTHROID) 50 MCG tablet Take 1 tablet (50 mcg total) by mouth daily before breakfast.   mirtazapine (REMERON) 15 MG tablet Take 15 mg by mouth at bedtime.   mirtazapine (REMERON) 45 MG tablet Take 0.5 tablets (22.5 mg total) by mouth at bedtime. For depression   Multiple Vitamins-Minerals (CENTRUM SILVER PO) Take by mouth daily.   PARoxetine (PAXIL) 40 MG tablet Take 1 tablet (40 mg total) by mouth at bedtime. For depression   No facility-administered encounter medications on file as of 06/06/2022.    Allergies (verified) Cats claw [Angelica Ran  tomentosa (cats claw)]   History: Past Medical History:  Diagnosis Date   ALLERGIC RHINITIS 10/05/2007   ANXIETY 10/05/2007   COLONIC POLYPS, HX OF 10/05/2007   DEPRESSION 10/04/2008   PROSTATE CANCER, UNSPEC. 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   Past Surgical History:  Procedure Laterality Date   NASAL SINUS SURGERY     PROSTATE SURGERY     singulotomy     for OCD   TONSILLECTOMY     Family History  Problem Relation Age of Onset   Prostate cancer Father    Breast cancer Sister     Social History   Socioeconomic History   Marital status: Married    Spouse name: Not on file   Number of children: 2   Years of education: Not on file   Highest education level: Not on file  Occupational History   Occupation: Retired    Fish farm manager: RETIRED  Tobacco Use   Smoking status: Former    Types: Cigarettes    Quit date: 10/13/1973    Years since quitting: 48.6   Smokeless tobacco: Never  Substance and Sexual Activity   Alcohol use: Yes    Alcohol/week: 28.0 standard drinks of alcohol    Types: 14 Cans of beer, 14 Standard drinks or equivalent per week    Comment: glass of chardonnay each night   Drug use: No   Sexual activity: Not on file  Other Topics Concern   Not on file  Social History Narrative   Not on file   Social Determinants of Health   Financial Resource Strain: Low Risk  (06/06/2022)   Overall Financial Resource Strain (CARDIA)    Difficulty of Paying Living Expenses: Not hard at all  Food Insecurity: No Food Insecurity (06/06/2022)   Hunger Vital Sign    Worried About Running Out of Food in the Last Year: Never true    Ran Out of Food in the Last Year: Never true  Transportation Needs: No Transportation Needs (06/06/2022)   PRAPARE - Hydrologist (Medical): No    Lack of Transportation (Non-Medical): No  Physical Activity: Inactive (06/06/2022)   Exercise Vital Sign    Days of Exercise per Week: 0 days    Minutes of Exercise per Session: 0 min  Stress: No Stress Concern Present (06/06/2022)   [place]    Feeling of Stress : Not at all  Social Connections: Moderately Isolated (06/06/2022)   Social Connection and Isolation Panel [NHANES]    Frequency of Communication with Friends and Family: More than three times a week    Frequency of Social Gatherings with Friends and Family: More than three times a week    Attends Religious Services: Never    Building surveyor or Organizations: No    Attends Music therapist: Never    Marital Status: Married    Tobacco Counseling Counseling given: Not Answered   Clinical Intake:  Pre-visit preparation completed: No  Pain : No/denies pain     BMI - recorded: 32.8 Nutritional Status: BMI > 30  Obese Nutritional Risks: None Diabetes: No  How often do you need to have someone help you when you read instructions, pamphlets, or other written materials from your doctor or pharmacy?: 1 - Never  Diabetic?  No  Interpreter Needed?: No  Information entered by :: Rolene Arbour LPN   Activities of Daily Living    06/06/2022  12:44 PM  In your present state of health, do you have any difficulty performing the following activities:  Hearing? 0  Vision? 0  Difficulty concentrating or making decisions? 0  Walking or climbing stairs? 0  Dressing or bathing? 0  Doing errands, shopping? 0  Preparing Food and eating ? N  Using the Toilet? N  In the past six months, have you accidently leaked urine? Y  Comment Dx Postrate Cancer. Wears depends. Followed by Urologist  Do you have problems with loss of bowel control? N  Managing your Medications? N  Managing your Finances? N  Housekeeping or managing your Housekeeping? N    Patient Care Team: Billie Ruddy, MD as PCP - General (Family Medicine) Norma Fredrickson, MD as Consulting Physician (Psychiatry)  Indicate any recent Medical Services you may have received from other than [facility] providers in the past year (date may be approximate).     Assessment:   This is a routine wellness examination for [place].  Hearing/Vision screen Hearing Screening - Comments:: No hearing difficulty Vision Screening - Comments:: Wears glasses. Followed by [place] issues and exercise activities discussed: Current Exercise Habits: Home exercise routine, Type of exercise: walking, Time (Minutes): 10, Frequency (Times/Week): 3,  Weekly Exercise (Minutes/Week): 30, Intensity: Moderate, Exercise limited by: None identified   Goals Addressed               This Visit's Progress     Stay Healthy (pt-stated)        Enjoy Life.       Depression Screen    06/06/2022   12:42 PM 06/05/2021    9:06 AM 06/12/2020    9:55 AM 06/04/2020    8:16 AM 06/01/2019    4:41 PM 06/01/2019    4:38 PM 05/25/2018    8:42 AM  PHQ 2/9 Scores  PHQ - 2 Score 0 0 0 0 0 0 0  PHQ- 9 Score   0  0 0     Fall Risk    06/06/2022   12:47 PM 06/05/2021    9:06 AM 06/05/2021    8:12 AM 06/12/2020    9:53 AM 06/04/2020    8:16 AM  Fall Risk   Falls in the past year? 0 1 1 0 0  Number falls in past yr: 0 0 0 0 0  Injury with Fall? 0 0 0 0 0  Risk for fall due to : No Fall Risks   Medication side effect   Follow up Falls prevention discussed   Falls evaluation completed;Falls prevention discussed     FALL RISK PREVENTION PERTAINING TO THE HOME:  Any stairs in or around the home? Yes  If so, are there any without handrails? No  Home free of loose throw rugs in walkways, pet beds, electrical cords, etc? Yes  Adequate lighting in your home to reduce risk of falls? Yes   ASSISTIVE DEVICES UTILIZED TO PREVENT FALLS:  Life alert? No  Use of a cane, walker or w/c? No  Grab bars in the bathroom? Yes  Shower chair or bench in shower? Yes  Elevated toilet seat or a handicapped toilet? Yes   TIMED UP AND GO:  Was the test performed? No . Audio Visit  Cognitive Function:        06/06/2022   12:48 PM 06/12/2020    9:57 AM  6CIT Screen  What Year? 0 points 0 points  What month? 0 points 0 points  What time? 0  points 0 points  Count back from 20 0 points 0 points  Months in reverse 0 points 0 points  Repeat phrase 0 points 0 points  Total Score 0 points 0 points    Immunizations Immunization History  Administered Date(s) Administered   Fluad Quad(high Dose 65+) 07/09/2019   Influenza Split 08/06/2011, 07/08/2012   Influenza  Whole 07/29/2007, 07/26/2008, 07/10/2009, 06/27/2010   Influenza, High Dose Seasonal PF 07/06/2015, 06/30/2018, 07/16/2020   Influenza,inj,Quad PF,6+ Mos 06/22/2013, 06/29/2014   Influenza-Unspecified 07/04/2016, 07/22/2017, 07/25/2021   Moderna Sars-Covid-2 Vaccination 10/17/2019, 11/14/2019   Pneumococcal Conjugate-13 03/30/2015   Pneumococcal Polysaccharide-23 07/14/2003   Td 10/19/2009   Zoster, Live 12/13/2008    TDAP status: Due, Education has been provided regarding the importance of this vaccine. Advised may receive this vaccine at local pharmacy or Health Dept. Aware to provide a copy of the vaccination record if obtained from local pharmacy or Health Dept. Verbalized acceptance and understanding.  Flu Vaccine status: Up to date  Pneumococcal vaccine status: Up to date  Covid-19 vaccine status: Completed vaccines  Qualifies for Shingles Vaccine? Yes   Zostavax completed Yes   Shingrix Completed?: Yes  Screening Tests Health Maintenance  Topic Date Due   Zoster Vaccines- Shingrix (1 of 2) Never done   INFLUENZA VACCINE  05/13/2022   COVID-19 Vaccine (3 - Moderna risk series) 06/22/2022 (Originally 12/12/2019)   TETANUS/TDAP  06/07/2023 (Originally 10/20/2019)   Pneumonia Vaccine 24+ Years old  Completed   HPV VACCINES  Aged Out    Health Maintenance  Health Maintenance Due  Topic Date Due   Zoster Vaccines- Shingrix (1 of 2) Never done   INFLUENZA VACCINE  05/13/2022    Colorectal cancer screening: No longer required.   Lung Cancer Screening: (Low Dose CT Chest recommended if Age 53-80 years, 30 pack-year currently smoking OR have quit w/in 15years.) does not qualify.    Additional Screening:  Hepatitis C Screening: does not qualify; Completed   Vision Screening: Recommended annual ophthalmology exams for early detection of glaucoma and other disorders of the eye. Is the patient up to date with their annual eye exam?  Yes  Who is the provider or what is the  name of the office in which the patient attends annual eye exams? [facility] If pt is not established with a provider, would they like to be referred to a provider to establish care? No .   Dental Screening: Recommended annual dental exams for proper oral hygiene  Community Resource Referral / Chronic Care Management:   CRR required this visit?  No   CCM required this visit?  No      Plan:     I have personally reviewed and noted the following in the patient's chart:   Medical and social history Use of alcohol, tobacco or illicit drugs  Current medications and supplements including opioid prescriptions. Patient is not currently taking opioid prescriptions. Functional ability and status Nutritional status Physical activity Advanced directives List of other physicians Hospitalizations, surgeries, and ER visits in previous 12 months Vitals Screenings to include cognitive, depression, and falls Referrals and appointments  In addition, I have reviewed and discussed with patient certain preventive protocols, quality metrics, and best practice recommendations. A written personalized care plan for preventive services as well as general preventive health recommendations were provided to patient.     Criselda Peaches, LPN   [id number]   Nurse Notes: Patient request f/u with concerns of past postate surgery

## 2022-06-06 NOTE — Patient Instructions (Addendum)
[name] , Thank you for taking time to come for your Medicare Wellness Visit. I appreciate your ongoing commitment to your health goals. Please review the following plan we discussed and let me know if I can assist you in the future.   Screening recommendations/referrals: Colonoscopy: No longer required due to age Recommended yearly ophthalmology/optometry visit for glaucoma screening and checkup Recommended yearly dental visit for hygiene and checkup  Vaccinations: Influenza vaccine: Up to date repeat in fall Pneumococcal vaccine: Up to date Tdap vaccine: Due Last done 2011 Shingles vaccine: Done at pharmacy per patient   Covid-19: Done  Advanced directives: Please bring a copy of your health care power of attorney and living will to the office to be added to your chart at your convenience.   Conditions/risks identified: Each day, aim for 6 glasses of water, plenty of protein in your diet and try to get up and walk/ stretch every hour for 5-10 minutes at a time.    Next appointment: Follow up in one year for your annual wellness visit.   Preventive Care 86 Years and Older, Male  Preventive care refers to lifestyle choices and visits with your health care provider that can promote health and wellness. What does preventive care include? A yearly physical exam. This is also called an annual well check. Dental exams once or twice a year. Routine eye exams. Ask your health care provider how often you should have your eyes checked. Personal lifestyle choices, including: Daily care of your teeth and gums. Regular physical activity. Eating a healthy diet. Avoiding tobacco and drug use. Limiting alcohol use. Practicing safe sex. Taking low doses of aspirin every day. Taking vitamin and mineral supplements as recommended by your health care provider. What happens during an annual well check? The services and screenings done by your health care provider during your annual well check  will depend on your age, overall health, lifestyle risk factors, and family history of disease. Counseling  Your health care provider may ask you questions about your: Alcohol use. Tobacco use. Drug use. Emotional well-being. Home and relationship well-being. Sexual activity. Eating habits. History of falls. Memory and ability to understand (cognition). Work and work Statistician. Screening  You may have the following tests or measurements: Height, weight, and BMI. Blood pressure. Lipid and cholesterol levels. These may be checked every 5 years, or more frequently if you are over 86 years old. Skin check. Lung cancer screening. You may have this screening every year starting at age 86 if you have a 30-pack-year history of smoking and currently smoke or have quit within the past 15 years. Fecal occult blood test (FOBT) of the stool. You may have this test every year starting at age 86. Flexible sigmoidoscopy or colonoscopy. You may have a sigmoidoscopy every 5 years or a colonoscopy every 10 years starting at age 86. Prostate cancer screening. Recommendations will vary depending on your family history and other risks. Hepatitis C blood test. Hepatitis B blood test. Sexually transmitted disease (STD) testing. Diabetes screening. This is done by checking your blood sugar (glucose) after you have not eaten for a while (fasting). You may have this done every 1-3 years. Abdominal aortic aneurysm (AAA) screening. You may need this if you are a current or former smoker. Osteoporosis. You may be screened starting at age 86 if you are at high risk. Talk with your health care provider about your test results, treatment options, and if necessary, the need for more tests. Vaccines  Your  health care provider may recommend certain vaccines, such as: Influenza vaccine. This is recommended every year. Tetanus, diphtheria, and acellular pertussis (Tdap, Td) vaccine. You may need a Td booster every 10  years. Zoster vaccine. You may need this after age 49. Pneumococcal 13-valent conjugate (PCV13) vaccine. One dose is recommended after age 86. Pneumococcal polysaccharide (PPSV23) vaccine. One dose is recommended after age 100. Talk to your health care provider about which screenings and vaccines you need and how often you need them. This information is not intended to replace advice given to you by your health care provider. Make sure you discuss any questions you have with your health care provider. Document Released: 10/26/2015 Document Revised: 06/18/2016 Document Reviewed: 07/31/2015 Elsevier Interactive Patient Education  2017 [place] Prevention in the Home Falls can cause injuries. They can happen to people of all ages. There are many things you can do to make your home safe and to help prevent falls. What can I do on the outside of my home? Regularly fix the edges of walkways and driveways and fix any cracks. Remove anything that might make you trip as you walk through a door, such as a raised step or threshold. Trim any bushes or trees on the path to your home. Use bright outdoor lighting. Clear any walking paths of anything that might make someone trip, such as rocks or tools. Regularly check to see if handrails are loose or broken. Make sure that both sides of any steps have handrails. Any raised decks and porches should have guardrails on the edges. Have any leaves, snow, or ice cleared regularly. Use sand or salt on walking paths during winter. Clean up any spills in your garage right away. This includes oil or grease spills. What can I do in the bathroom? Use night lights. Install grab bars by the toilet and in the tub and shower. Do not use towel bars as grab bars. Use non-skid mats or decals in the tub or shower. If you need to sit down in the shower, use a plastic, non-slip stool. Keep the floor dry. Clean up any water that spills on the floor as soon as it  happens. Remove soap buildup in the tub or shower regularly. Attach bath mats securely with double-sided non-slip rug tape. Do not have throw rugs and other things on the floor that can make you trip. What can I do in the bedroom? Use night lights. Make sure that you have a light by your bed that is easy to reach. Do not use any sheets or blankets that are too big for your bed. They should not hang down onto the floor. Have a firm chair that has side arms. You can use this for support while you get dressed. Do not have throw rugs and other things on the floor that can make you trip. What can I do in the kitchen? Clean up any spills right away. Avoid walking on wet floors. Keep items that you use a lot in easy-to-reach places. If you need to reach something above you, use a strong step stool that has a grab bar. Keep electrical cords out of the way. Do not use floor polish or wax that makes floors slippery. If you must use wax, use non-skid floor wax. Do not have throw rugs and other things on the floor that can make you trip. What can I do with my stairs? Do not leave any items on the stairs. Make sure that there are handrails  on both sides of the stairs and use them. Fix handrails that are broken or loose. Make sure that handrails are as long as the stairways. Check any carpeting to make sure that it is firmly attached to the stairs. Fix any carpet that is loose or worn. Avoid having throw rugs at the top or bottom of the stairs. If you do have throw rugs, attach them to the floor with carpet tape. Make sure that you have a light switch at the top of the stairs and the bottom of the stairs. If you do not have them, ask someone to add them for you. What else can I do to help prevent falls? Wear shoes that: Do not have high heels. Have rubber bottoms. Are comfortable and fit you well. Are closed at the toe. Do not wear sandals. If you use a stepladder: Make sure that it is fully opened.  Do not climb a closed stepladder. Make sure that both sides of the stepladder are locked into place. Ask someone to hold it for you, if possible. Clearly mark and make sure that you can see: Any grab bars or handrails. First and last steps. Where the edge of each step is. Use tools that help you move around (mobility aids) if they are needed. These include: Canes. Walkers. Scooters. Crutches. Turn on the lights when you go into a dark area. Replace any light bulbs as soon as they burn out. Set up your furniture so you have a clear path. Avoid moving your furniture around. If any of your floors are uneven, fix them. If there are any pets around you, be aware of where they are. Review your medicines with your doctor. Some medicines can make you feel dizzy. This can increase your chance of falling. Ask your doctor what other things that you can do to help prevent falls. This information is not intended to replace advice given to you by your health care provider. Make sure you discuss any questions you have with your health care provider. Document Released: 07/26/2009 Document Revised: 03/06/2016 Document Reviewed: 11/03/2014 Elsevier Interactive Patient Education  2017 [employer].

## 2022-06-09 ENCOUNTER — Ambulatory Visit (INDEPENDENT_AMBULATORY_CARE_PROVIDER_SITE_OTHER): Payer: Medicare Other | Admitting: Family Medicine

## 2022-06-09 ENCOUNTER — Encounter: Payer: Self-pay | Admitting: Family Medicine

## 2022-06-09 VITALS — BP 128/72 | HR 72 | Temp 98.0°F | Ht 70.0 in | Wt 222.2 lb

## 2022-06-09 DIAGNOSIS — Z6832 Body mass index (BMI) 32.0-32.9, adult: Secondary | ICD-10-CM

## 2022-06-09 DIAGNOSIS — Z8546 Personal history of malignant neoplasm of prostate: Secondary | ICD-10-CM | POA: Diagnosis not present

## 2022-06-09 DIAGNOSIS — E039 Hypothyroidism, unspecified: Secondary | ICD-10-CM

## 2022-06-09 DIAGNOSIS — N62 Hypertrophy of breast: Secondary | ICD-10-CM

## 2022-06-09 DIAGNOSIS — Z8659 Personal history of other mental and behavioral disorders: Secondary | ICD-10-CM

## 2022-06-09 DIAGNOSIS — D499 Neoplasm of unspecified behavior of unspecified site: Secondary | ICD-10-CM

## 2022-06-09 DIAGNOSIS — E661 Drug-induced obesity: Secondary | ICD-10-CM | POA: Diagnosis not present

## 2022-06-09 DIAGNOSIS — K429 Umbilical hernia without obstruction or gangrene: Secondary | ICD-10-CM

## 2022-06-09 DIAGNOSIS — L219 Seborrheic dermatitis, unspecified: Secondary | ICD-10-CM | POA: Diagnosis not present

## 2022-06-09 DIAGNOSIS — L918 Other hypertrophic disorders of the skin: Secondary | ICD-10-CM

## 2022-06-09 DIAGNOSIS — N3945 Continuous leakage: Secondary | ICD-10-CM | POA: Diagnosis not present

## 2022-06-09 DIAGNOSIS — R0981 Nasal congestion: Secondary | ICD-10-CM | POA: Diagnosis not present

## 2022-06-09 LAB — COMPREHENSIVE METABOLIC PANEL
ALT: 13 U/L (ref 0–53)
AST: 16 U/L (ref 0–37)
Albumin: 4.2 g/dL (ref 3.5–5.2)
Alkaline Phosphatase: 63 U/L (ref 39–117)
BUN: 23 mg/dL (ref 6–23)
CO2: 28 mEq/L (ref 19–32)
Calcium: 9.3 mg/dL (ref 8.4–10.5)
Chloride: 106 mEq/L (ref 96–112)
Creatinine, Ser: 1.3 mg/dL (ref 0.40–1.50)
GFR: 49.41 mL/min — ABNORMAL LOW (ref >60.00)
Glucose, Bld: 95 mg/dL (ref 70–99)
Potassium: 5 mEq/L (ref 3.5–5.1)
Sodium: 141 mEq/L (ref 135–145)
Total Bilirubin: 0.7 mg/dL (ref 0.2–1.2)
Total Protein: 6.8 g/dL (ref 6.0–8.3)

## 2022-06-09 LAB — CBC WITH DIFFERENTIAL/PLATELET
Basophils Absolute: 0 10*3/uL (ref 0.0–0.1)
Basophils Relative: 0.8 % (ref 0.0–3.0)
Eosinophils Absolute: 0.1 10*3/uL (ref 0.0–0.7)
Eosinophils Relative: 2.8 % (ref 0.0–5.0)
HCT: 40.5 % (ref 39.0–52.0)
Hemoglobin: 13.7 g/dL (ref 13.0–17.0)
Lymphocytes Relative: 28.7 % (ref 12.0–46.0)
Lymphs Abs: 1.1 10*3/uL (ref 0.7–4.0)
MCHC: 33.8 g/dL (ref 30.0–36.0)
MCV: 99 fl (ref 78.0–100.0)
Monocytes Absolute: 0.4 10*3/uL (ref 0.1–1.0)
Monocytes Relative: 10.8 % (ref 3.0–12.0)
Neutro Abs: 2.2 10*3/uL (ref 1.4–7.7)
Neutrophils Relative %: 56.9 % (ref 43.0–77.0)
Platelets: 164 10*3/uL (ref 150.0–400.0)
RBC: 4.09 Mil/uL — ABNORMAL LOW (ref 4.22–5.81)
RDW: 13.7 % (ref 11.5–15.5)
WBC: 3.8 10*3/uL — ABNORMAL LOW (ref 4.0–10.5)

## 2022-06-09 LAB — T4, FREE: Free T4: 0.68 ng/dL (ref 0.60–1.60)

## 2022-06-09 LAB — LIPID PANEL
Cholesterol: 116 mg/dL (ref 0–200)
HDL: 44.8 mg/dL (ref >39.00)
LDL Cholesterol: 56 mg/dL (ref 0–99)
NonHDL: 71.38
Total CHOL/HDL Ratio: 3
Triglycerides: 75 mg/dL (ref 0.0–149.0)
VLDL: 15 mg/dL (ref 0.0–40.0)

## 2022-06-09 LAB — TSH: TSH: 2.52 u[IU]/mL (ref 0.35–5.50)

## 2022-06-09 NOTE — Patient Instructions (Signed)
A referral was placed for you to see a general surgeon regarding your umbilical hernia.  You should expect a phone call about scheduling this appointment.

## 2022-06-09 NOTE — Progress Notes (Signed)
Subjective:    Patient ID: Gary Osborn, male    DOB: 1933-05-22, 88 y.o.   MRN: [id number]  Chief Complaint  Patient presents with   Annual Exam    Fasting.     HPI Patient was seen today for follow-up.  Patient had AWV visit last week.  Patient states he is doing well overall.  Endorses an area above his navel that is often sore with pressing and edematous.  Patient states it is one of the areas they went through during his surgery for prostatectomy ~10 yrs ago.  Pt followed by [facility].  Endorses continuous urinary leakage status post prostatectomy.  Patient wears depends which are typically stay saturated.  Uses 1 pair of depends per day.  Pt has had several episodes of balanitis, using a cream which seems to help.  Patient followed by psychiatry.  States no longer having depression symptoms.  Patient taking Synthroid for hypothyroidism.  Patient states his dentist noticed a lesion in his mouth which is precancerous.  Had biopsy.  Followed by Dr. Constance Holster at [facility].  Patient seen by PA at dermatology.  Endorses having several skin lesions removed via cryotherapy.   Past Medical History:  Diagnosis Date   ALLERGIC RHINITIS 10/05/2007   ANXIETY 10/05/2007   COLONIC POLYPS, HX OF 10/05/2007   DEPRESSION 10/04/2008   PROSTATE CANCER, UNSPEC. 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    Allergies  Allergen Reactions   Cats Claw [Uncaria Tomentosa (Cats Claw)]     PT IS ALLERGIC TO CATS/ DOES NOT HAVE ANY    ROS General: Denies fever, chills, night sweats, changes in weight, changes in appetite HEENT: Denies headaches, ear pain, changes in vision, rhinorrhea, sore throat CV: Denies CP, palpitations, SOB, orthopnea Pulm: Denies SOB, cough, wheezing GI: Denies abdominal pain, nausea, vomiting, diarrhea, constipation +bulge above navel GU: Denies dysuria, hematuria, frequency + urinary leakage Msk: Denies muscle cramps, joint pains Neuro: Denies weakness, numbness,  tingling Skin: Denies rashes, bruising Psych: Denies depression, anxiety, hallucinations     Objective:    Blood pressure 128/72, pulse (!) 34, temperature 98 F (36.7 C), temperature source Oral, height '5\' 10"'$  (1.778 m), weight 222 lb 3.2 oz (100.8 kg), SpO2 92 %.  Gen. Pleasant, well-nourished, in no distress, normal affect   HEENT: /AT, face symmetric, conjunctiva clear, no scleral icterus, PERRLA, EOMI, nares patent without drainage, pharynx without erythema or exudate.  TMs normal bilaterally. Lungs: no accessory muscle use, CTAB, no wheezes or rales Cardiovascular: RRR, no m/r/g, no peripheral edema Abdomen: BS present, soft, 1.5 cm reducible umbilical hernia, NT/ND, no hepatosplenomegaly. Musculoskeletal: No deformities, no cyanosis or clubbing, normal tone Neuro:  A&Ox3, CN II-XII intact, normal gait.  Pill rolling tremor at rest. Skin:  Warm, dry, intact.  Numerous skin tags near neck and upper chest, seborrheic keratosis noted, and red nevi   Wt Readings from Last 3 Encounters:  06/09/22 222 lb 3.2 oz (100.8 kg)  06/06/22 222 lb (100.7 kg)  02/07/22 222 lb 3.2 oz (100.8 kg)    Lab Results  Component Value Date   WBC 4.0 02/07/2022   HGB 13.2 02/07/2022   HCT 40.0 02/07/2022   PLT 156 02/07/2022   GLUCOSE 93 08/09/2021   CHOL 125 06/05/2021   TRIG 84.0 06/05/2021   HDL 44.30 06/05/2021   LDLCALC 64 06/05/2021   ALT 18 08/09/2021   AST 22 08/09/2021   NA 140 08/09/2021   K 4.4 08/09/2021   CL 105 08/09/2021  CREATININE 1.33 (H) 08/09/2021   BUN 20 08/09/2021   CO2 28 08/09/2021   TSH 2.55 06/05/2021   PSA 0.00 (L) 06/01/2019   HGBA1C 5.2 09/16/2014      06/09/2022    8:17 AM 06/06/2022   12:42 PM 06/05/2021    9:06 AM  Depression screen PHQ 2/9  Decreased Interest 0 0 0  Down, Depressed, Hopeless 0 0 0  PHQ - 2 Score 0 0 0  Altered sleeping 0    Tired, decreased energy 0    Change in appetite 0    Feeling bad or failure about yourself  0     Trouble concentrating 0    Moving slowly or fidgety/restless 0    Suicidal thoughts 0    PHQ-9 Score 0    Difficult doing work/chores Not difficult at all      Assessment/Plan:  Umbilical hernia without obstruction and without gangrene -Reducible on exam -Given strict precautions - Plan: CBC with Differential/Platelet, CMP, Ambulatory referral to General Surgery  History of prostate cancer -Status post prostatectomy -Continue follow-up with [facility]  - Plan: CBC with Differential/Platelet  History of depression -History of schizoaffective disorder, depressive type -PHQ-9 score 0 -Continue current medications -Continue follow-up with psychiatry  Acquired hypothyroidism  -Continue Synthroid 50 mcg daily -We will adjust dose if needed based on lab results - Plan: TSH, T4, Free  Nasal congestion  -Likely 2/2 seasonal allergies -Continue OTC saline nasal rinse -For increased symptoms okay to use OTC antihistamine such as Allegra -Can also use local honey - Plan: CBC with Differential/Platelet  Gynecomastia  -Likely 2/2 medications - Plan: TSH, T4, Free  Continuous leakage of urine  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 Constance Holster at [facility]  Seborrheic dermatitis -Continue follow-up with Derm  Skin tags, multiple acquired -Discussed options for removal  -Continue follow-up with Derm  Class 1 drug-induced obesity with serious comorbidity and body mass index (BMI) of 32.0 to 32.9 in adult -Lifestyle modifications encouraged  - Plan: CBC with Differential/Platelet, Lipid panel  F/u 4-6 months, sooner as needed  Grier Mitts, MD

## 2022-06-23 DIAGNOSIS — K432 Incisional hernia without obstruction or gangrene: Secondary | ICD-10-CM | POA: Diagnosis not present

## 2022-06-24 DIAGNOSIS — L570 Actinic keratosis: Secondary | ICD-10-CM | POA: Diagnosis not present

## 2022-06-24 DIAGNOSIS — D485 Neoplasm of uncertain behavior of skin: Secondary | ICD-10-CM | POA: Diagnosis not present

## 2022-06-24 DIAGNOSIS — N481 Balanitis: Secondary | ICD-10-CM | POA: Diagnosis not present

## 2022-06-24 DIAGNOSIS — L821 Other seborrheic keratosis: Secondary | ICD-10-CM | POA: Diagnosis not present

## 2022-07-01 DIAGNOSIS — K1379 Other lesions of oral mucosa: Secondary | ICD-10-CM | POA: Diagnosis not present

## 2022-07-29 DIAGNOSIS — Z23 Encounter for immunization: Secondary | ICD-10-CM | POA: Diagnosis not present

## 2022-08-14 DIAGNOSIS — Z23 Encounter for immunization: Secondary | ICD-10-CM | POA: Diagnosis not present

## 2022-08-15 DIAGNOSIS — H401432 Capsular glaucoma with pseudoexfoliation of lens, bilateral, moderate stage: Secondary | ICD-10-CM | POA: Diagnosis not present

## 2022-08-15 DIAGNOSIS — H524 Presbyopia: Secondary | ICD-10-CM | POA: Diagnosis not present

## 2022-08-20 ENCOUNTER — Ambulatory Visit: Payer: Medicare Other | Admitting: Endocrinology

## 2022-08-20 DIAGNOSIS — F251 Schizoaffective disorder, depressive type: Secondary | ICD-10-CM | POA: Diagnosis not present

## 2022-08-26 DIAGNOSIS — L570 Actinic keratosis: Secondary | ICD-10-CM | POA: Diagnosis not present

## 2022-08-26 DIAGNOSIS — N481 Balanitis: Secondary | ICD-10-CM | POA: Diagnosis not present

## 2022-08-26 DIAGNOSIS — D1800 Hemangioma unspecified site: Secondary | ICD-10-CM | POA: Diagnosis not present

## 2022-08-26 DIAGNOSIS — L853 Xerosis cutis: Secondary | ICD-10-CM | POA: Diagnosis not present

## 2022-08-26 DIAGNOSIS — L821 Other seborrheic keratosis: Secondary | ICD-10-CM | POA: Diagnosis not present

## 2022-09-17 DIAGNOSIS — Z8546 Personal history of malignant neoplasm of prostate: Secondary | ICD-10-CM | POA: Diagnosis not present

## 2022-09-17 DIAGNOSIS — N481 Balanitis: Secondary | ICD-10-CM | POA: Diagnosis not present

## 2022-09-17 DIAGNOSIS — E291 Testicular hypofunction: Secondary | ICD-10-CM | POA: Diagnosis not present

## 2022-10-09 ENCOUNTER — Inpatient Hospital Stay: Payer: Medicare Other | Admitting: Nurse Practitioner

## 2022-10-09 ENCOUNTER — Telehealth: Payer: Self-pay | Admitting: Family Medicine

## 2022-10-09 ENCOUNTER — Inpatient Hospital Stay: Payer: Medicare Other | Attending: Family Medicine

## 2022-10-09 NOTE — Telephone Encounter (Signed)
Attempt to reach [facility]. Left a detail message that we did received the fax but provider is out of office and won't be back until next week. Once the provider is back, she will address the form. And for them to call us back if have further question.

## 2022-10-09 NOTE — Telephone Encounter (Signed)
[facility] / Nira Conn called to FU/inquire about the fax sent on 10/02/22  regarding surgical clearance.  Surgery is scheduled for 10/22/22  [phone]

## 2022-10-15 NOTE — Telephone Encounter (Signed)
This provider has not received surgical risk form.

## 2022-10-16 ENCOUNTER — Telehealth: Payer: Self-pay | Admitting: Family Medicine

## 2022-10-16 NOTE — Telephone Encounter (Signed)
Contacted [employer] spoke to [place]. Request for them to refax the form to [phone]. Waiting on the form.

## 2022-10-16 NOTE — Telephone Encounter (Signed)
Fax was received. The form was given to Dr. Volanda Napoleon.

## 2022-10-16 NOTE — Telephone Encounter (Signed)
Faxed over request for surgical clearance starting in November has not received response for patient to discontinue anticoagulant. Surgery is 10/22/22. Requesting form response. Please fax to [phone]

## 2022-10-17 NOTE — Telephone Encounter (Signed)
Form was brought to Dr. Volanda Napoleon yesterday.

## 2022-10-22 DIAGNOSIS — H268 Other specified cataract: Secondary | ICD-10-CM | POA: Diagnosis not present

## 2022-10-22 DIAGNOSIS — H269 Unspecified cataract: Secondary | ICD-10-CM | POA: Diagnosis not present

## 2022-10-22 DIAGNOSIS — H409 Unspecified glaucoma: Secondary | ICD-10-CM | POA: Diagnosis not present

## 2022-10-22 DIAGNOSIS — H2511 Age-related nuclear cataract, right eye: Secondary | ICD-10-CM | POA: Diagnosis not present

## 2022-10-22 DIAGNOSIS — H401412 Capsular glaucoma with pseudoexfoliation of lens, right eye, moderate stage: Secondary | ICD-10-CM | POA: Diagnosis not present

## 2022-10-22 DIAGNOSIS — H401111 Primary open-angle glaucoma, right eye, mild stage: Secondary | ICD-10-CM | POA: Diagnosis not present

## 2022-10-23 NOTE — Telephone Encounter (Signed)
Form was faxed successfully on 10/20/2022.

## 2022-10-23 NOTE — Telephone Encounter (Signed)
Was completed on left in completed folder last week after this provider was unable to due to busy signal.

## 2022-10-31 ENCOUNTER — Emergency Department (HOSPITAL_BASED_OUTPATIENT_CLINIC_OR_DEPARTMENT_OTHER)
Admission: EM | Admit: 2022-10-31 | Discharge: 2022-10-31 | Disposition: A | Discharge status: Home / Self Care | Payer: Medicare Other | Attending: Emergency Medicine | Admitting: Emergency Medicine

## 2022-10-31 ENCOUNTER — Other Ambulatory Visit (HOSPITAL_BASED_OUTPATIENT_CLINIC_OR_DEPARTMENT_OTHER): Payer: Medicare Other | Admitting: Radiology

## 2022-10-31 ENCOUNTER — Emergency Department (HOSPITAL_BASED_OUTPATIENT_CLINIC_OR_DEPARTMENT_OTHER): Payer: Medicare Other | Admitting: Radiology

## 2022-10-31 ENCOUNTER — Other Ambulatory Visit: Payer: Self-pay

## 2022-10-31 ENCOUNTER — Encounter (HOSPITAL_BASED_OUTPATIENT_CLINIC_OR_DEPARTMENT_OTHER): Payer: Self-pay

## 2022-10-31 DIAGNOSIS — R0789 Other chest pain: Secondary | ICD-10-CM | POA: Insufficient documentation

## 2022-10-31 DIAGNOSIS — Z8546 Personal history of malignant neoplasm of prostate: Secondary | ICD-10-CM | POA: Insufficient documentation

## 2022-10-31 DIAGNOSIS — M25522 Pain in left elbow: Secondary | ICD-10-CM | POA: Diagnosis not present

## 2022-10-31 DIAGNOSIS — S51012A Laceration without foreign body of left elbow, initial encounter: Secondary | ICD-10-CM | POA: Insufficient documentation

## 2022-10-31 DIAGNOSIS — J9811 Atelectasis: Secondary | ICD-10-CM | POA: Diagnosis not present

## 2022-10-31 DIAGNOSIS — W19XXXA Unspecified fall, initial encounter: Secondary | ICD-10-CM

## 2022-10-31 DIAGNOSIS — W06XXXA Fall from bed, initial encounter: Secondary | ICD-10-CM | POA: Insufficient documentation

## 2022-10-31 DIAGNOSIS — M25512 Pain in left shoulder: Secondary | ICD-10-CM | POA: Diagnosis not present

## 2022-10-31 DIAGNOSIS — M19012 Primary osteoarthritis, left shoulder: Secondary | ICD-10-CM | POA: Diagnosis not present

## 2022-10-31 LAB — CBC WITH DIFFERENTIAL/PLATELET
Abs Immature Granulocytes: 0.01 K/uL (ref 0.00–0.07)
Basophils Absolute: 0 K/uL (ref 0.0–0.1)
Basophils Relative: 0 %
Eosinophils Absolute: 0.1 K/uL (ref 0.0–0.5)
Eosinophils Relative: 2 %
HCT: 39 % (ref 39.0–52.0)
Hemoglobin: 13 g/dL (ref 13.0–17.0)
Immature Granulocytes: 0 %
Lymphocytes Relative: 23 %
Lymphs Abs: 1 K/uL (ref 0.7–4.0)
MCH: 33.1 pg (ref 26.0–34.0)
MCHC: 33.3 g/dL (ref 30.0–36.0)
MCV: 99.2 fL (ref 80.0–100.0)
Monocytes Absolute: 0.4 K/uL (ref 0.1–1.0)
Monocytes Relative: 9 %
Neutro Abs: 2.9 K/uL (ref 1.7–7.7)
Neutrophils Relative %: 66 %
Platelets: 156 K/uL (ref 150–400)
RBC: 3.93 MIL/uL — ABNORMAL LOW (ref 4.22–5.81)
RDW: 13 % (ref 11.5–15.5)
WBC: 4.5 K/uL (ref 4.0–10.5)
nRBC: 0 % (ref 0.0–0.2)

## 2022-10-31 LAB — BASIC METABOLIC PANEL
Anion gap: 10 (ref 5–15)
BUN: 22 mg/dL (ref 8–23)
CO2: 24 mmol/L (ref 22–32)
Calcium: 9.4 mg/dL (ref 8.9–10.3)
Chloride: 106 mmol/L (ref 98–111)
Creatinine, Ser: 1.23 mg/dL (ref 0.61–1.24)
GFR, Estimated: 57 mL/min — ABNORMAL LOW (ref >60)
Glucose, Bld: 90 mg/dL (ref 70–99)
Potassium: 4 mmol/L (ref 3.5–5.1)
Sodium: 140 mmol/L (ref 135–145)

## 2022-10-31 LAB — TROPONIN I (HIGH SENSITIVITY): Troponin I (High Sensitivity): 3 ng/L (ref <18)

## 2022-10-31 NOTE — Discharge Instructions (Signed)
You were seen in the emergency department today after a fall.  Your labs and imaging are normal.  Please follow-up with your primary care provider and return to the emergency department for any worsening pain symptoms.

## 2022-10-31 NOTE — ED Provider Notes (Signed)
[facility] Provider Note   CSN: [id number] Arrival date & time: 10/31/22  1056     History  Chief Complaint  Patient presents with   Gary Osborn is a 89 y.o. male.  With past medical history of prostate cancer, MDD who presents to the emergency department after fall.  Patient is from friend[facility] home, lives independently.  He states that last night he rolled out of bed in the middle of the night.  He states that he was having a bad dream and apparently rolled off the side of the bed onto the ground on his left side.  He denies striking his head or loss of consciousness.  He states that he was having left shoulder and elbow pain but got back into bed and went to sleep.  Had symptoms this morning and called the nurse at his facility who wanted him to get x-rays.  At this time he is complaining of "pain to his chest that goes through to his back, pain in the left elbow and left shoulder."  He does not normally use assistive devices to ambulate.  He denies having any prodromal symptoms such as chest pain, shortness of breath, dizziness or lightheadedness prior to falling.  Fall Associated symptoms include chest pain.       Home Medications Prior to Admission medications   Medication Sig Start Date End Date Taking? Authorizing Provider  busPIRone (BUSPAR) 10 MG tablet Take 10 mg by mouth 2 (two) times daily. Take 2 tabs twice daily    [provider]  clonazePAM (KLONOPIN) 0.5 MG tablet Take 1 tablet (0.5 mg total) by mouth 2 (two) times daily as needed. For severe anxiety 03/02/15   Clapacs, Madie Reno, MD  docusate sodium (COLACE) 100 MG capsule Take 100 mg by mouth daily.    [provider]  dorzolamide-timolol (COSOPT) 22.3-6.8 MG/ML ophthalmic solution Place 1 drop into both eyes 2 (two) times daily. 05/12/21   [provider]  levothyroxine (SYNTHROID) 50 MCG tablet Take 1 tablet (50 mcg total) by mouth daily  before breakfast. 05/23/22   Billie Ruddy, MD  mirtazapine (REMERON) 15 MG tablet Take 15 mg by mouth at bedtime. 06/24/21   [provider]  mirtazapine (REMERON) 45 MG tablet Take 0.5 tablets (22.5 mg total) by mouth at bedtime. For depression 03/02/15   Clapacs, Madie Reno, MD  Multiple Vitamins-Minerals (CENTRUM SILVER PO) Take by mouth daily.    [provider]  nystatin cream (MYCOSTATIN) Apply 1 Application topically 2 (two) times daily.    [provider]  PARoxetine (PAXIL) 40 MG tablet Take 1 tablet (40 mg total) by mouth at bedtime. For depression 09/28/14   Lindell Spar I, NP  tamoxifen (NOLVADEX) 10 MG tablet Take 10 mg by mouth daily. 09/26/22   [provider]  tamoxifen Janeth Rase) 10 MG/5ML solution Take 10 mg by mouth daily.    [provider]      Allergies    Cats claw [uncaria tomentosa (cats claw)]    Review of Systems   Review of Systems  Cardiovascular:  Positive for chest pain.  Musculoskeletal:  Positive for arthralgias and myalgias.  All other systems reviewed and are negative.   Physical Exam Updated Vital Signs BP (!) 143/84 (BP Location: Right Arm)   Pulse 64   Temp 97.6 F (36.4 C)   Resp 19   Ht '5\' 10"'$  (1.778 m)   Wt 100.8  kg   SpO2 96%   BMI 31.89 kg/m  Physical Exam Vitals and nursing note reviewed.  Constitutional:      General: He is not in acute distress.    Appearance: Normal appearance. He is obese. He is not ill-appearing or toxic-appearing.  HENT:     Head: Normocephalic.  Eyes:     General: No scleral icterus.    Extraocular Movements: Extraocular movements intact.     Pupils: Pupils are equal, round, and reactive to light.  Cardiovascular:     Rate and Rhythm: Normal rate and regular rhythm.     Pulses: Normal pulses.     Heart sounds: No murmur heard. Pulmonary:     Effort: Pulmonary effort is normal. No respiratory distress.     Breath sounds: Normal breath sounds. No wheezing,  rhonchi or rales.  Abdominal:     General: Bowel sounds are normal. There is no distension.     Palpations: Abdomen is soft.     Tenderness: There is no abdominal tenderness.  Musculoskeletal:        General: Signs of injury present. No tenderness or deformity. Normal range of motion.     Cervical back: Normal range of motion and neck supple. No tenderness.     Comments: Drinks is 5/5 in the bilateral upper and lower extremities.  He does have pain to the left chest wall with elbow extension.  Neurovascularly intact.  No obvious deformities.  Skin:    General: Skin is warm and dry.     Capillary Refill: Capillary refill takes less than 2 seconds.     Comments: Less than 1cm skin tear to the left elbow  Neurological:     General: No focal deficit present.     Mental Status: He is alert and oriented to person, place, and time. Mental status is at baseline.  Psychiatric:        Mood and Affect: Mood normal.        Behavior: Behavior normal.        Thought Content: Thought content normal.        Judgment: Judgment normal.     ED Results / Procedures / Treatments   Labs (all labs ordered are listed, but only abnormal results are displayed) Labs Reviewed  BASIC METABOLIC PANEL - Abnormal; Notable for the following components:      Result Value   GFR, Estimated 57 (*)    All other components within normal limits  CBC WITH DIFFERENTIAL/PLATELET - Abnormal; Notable for the following components:   RBC 3.93 (*)    All other components within normal limits  TROPONIN I (HIGH SENSITIVITY)  TROPONIN I (HIGH SENSITIVITY)    EKG EKG Interpretation  Date/Time:  Friday October 31 2022 13:09:55 EST Ventricular Rate:  63 PR Interval:  205 QRS Duration: 95 QT Interval:  433 QTC Calculation: 444 R Axis:   -70 Text Interpretation: Sinus rhythm Left anterior fascicular block Low voltage, precordial leads Abnormal R-wave progression, early transition Consider anterior infarct No significant  change since last tracing Confirmed by Leanord Asal ([id number]) on 10/31/2022 1:22:21 PM  Radiology DG Shoulder Left  Result Date: 10/31/2022 CLINICAL DATA:  Fall out of bed yesterday.  Pain. EXAM: LEFT SHOULDER - 2+ VIEW COMPARISON:  None Available. FINDINGS: Mild-to-moderate glenohumeral joint space narrowing and peripheral glenoid and inferior humeral head-neck junction degenerative osteophytes. Normal alignment of the acromioclavicular joint without significant degenerative change. No acute fracture is seen. No dislocation. IMPRESSION: Mild-to-moderate glenohumeral osteoarthritis. Electronically  Signed   By: Yvonne Kendall M.D.   On: 10/31/2022 13:18   DG Elbow Complete Left  Result Date: 10/31/2022 CLINICAL DATA:  Fall yesterday out of bed.  Left elbow pain. EXAM: LEFT ELBOW - COMPLETE 3+ VIEW COMPARISON:  None available FINDINGS: Normal position of the distal anterior humeral fat pad without elbow joint effusion. Normal bone mineralization. No acute fracture is seen. No dislocation. Minimal degenerative spurring at the tip of the coronoid process. IMPRESSION: No acute fracture or dislocation. Electronically Signed   By: Yvonne Kendall M.D.   On: 10/31/2022 13:17   DG Ribs Unilateral W/Chest Left  Result Date: 10/31/2022 CLINICAL DATA:  Fall yesterday. Rolled out of bed excellent onto the floor. Left lateral torso and left shoulder pain. EXAM: LEFT RIBS AND CHEST - 3+ VIEW COMPARISON:  Chest two views 01/21/2014 FINDINGS: Cardiac silhouette is at the upper limits of normal size. Mildly decreased lung volumes. Mediastinal contours are within normal limits with mildly tortuous descending thoracic aorta. Left lower lung horizontal linear subsegmental atelectasis. No pleural effusion or pneumothorax. Mild multilevel degenerative disc changes of the thoracic spine. No acute displaced left-sided rib fracture is identified. IMPRESSION: 1. No acute displaced left-sided rib fracture.  No pneumothorax. 2. Left  lower lung subsegmental atelectasis. Electronically Signed   By: Yvonne Kendall M.D.   On: 10/31/2022 13:15    Procedures Procedures    Medications Ordered in ED Medications - No data to display  ED Course/ Medical Decision Making/ A&P   {                            Medical Decision Making Amount and/or Complexity of Data Reviewed Labs: ordered. Radiology: ordered.  Initial Impression and Ddx 89 year old male who presents to the emergency department after mechanical fall.  He is alert and oriented and appropriate.  He has a skin tear to the left elbow.  No focal deficit or weakness or obvious injuries.  He does complain of left-sided chest pain to his back and left arm pain.  This may be from the fall but will obtain cardiac labs and imaging to ensure that there is no underlying ACS. Patient PMH that increases complexity of ED encounter: Prostate cancer, depression  Interpretation of Diagnostics I independent reviewed and interpreted the labs as followed: BMP, CBC, troponin negative  - I independently visualized the following imaging with scope of interpretation limited to determining acute life threatening conditions related to emergency care: Plain film of the left shoulder, elbow, chest, which revealed no acute findings  Patient Reassessment and Ultimate Disposition/Management This is a well-appearing 89 year old male.  He ambulates here without assistive device with stable gait. Fell onto his left side and was complaining of left shoulder and elbow pain.  We obtained plain film imaging which was negative. He did not strike his head or lose consciousness or have prodromal symptoms.  Not anticoagulated.  No neck pain on exam.  Deferred CT head and neck imaging. He did describe having this left-sided chest pain that goes to his back.  Given his age felt it appropriate to obtain a troponin and EKG at this time which was negative.  The pain did begin after he fell which is likely  musculoskeletal.  Encouraged to use Tylenol or Motrin for pain relief.  Otherwise feel that he is appropriate for discharge at this time.  The patient has been appropriately medically screened and/or stabilized in the ED. I  have low suspicion for any other emergent medical condition which would require further screening, evaluation or treatment in the ED or require inpatient management. At time of discharge the patient is hemodynamically stable and in no acute distress. I have discussed work-up results and diagnosis with patient and answered all questions. Patient is agreeable with discharge plan. We discussed strict return precautions for returning to the emergency department and they verbalized understanding.     Patient management required discussion with the following services or consulting groups:  None  Complexity of Problems Addressed Acute complicated illness or Injury  Additional Data Reviewed and Analyzed Further history obtained from: Past medical history and medications listed in the EMR, Care Everywhere, and Prior labs/imaging results  Patient Encounter Risk Assessment SDOH impact on management  Final Clinical Impression(s) / ED Diagnoses Final diagnoses:  Fall, initial encounter    Rx / DC Orders ED Discharge Orders     None         Mickie Hillier, PA-C 10/31/22 1454    Leanord Asal K, DO 10/31/22 1539

## 2022-10-31 NOTE — ED Notes (Signed)
Patient transported to X-ray 

## 2022-10-31 NOTE — ED Triage Notes (Signed)
Patient here POV from [facility]  Endorses Fall yesterday PM when he rolled OOB accidentally onto the Floor. No LOC. No Head Injury. No Anticoagulants.   Pain to Left Elbow, Left Lateral Torso, and Left Shoulder.   NAD Noted during Triage. A&Ox4. GCS 15. Ambulatory.

## 2022-11-17 ENCOUNTER — Encounter: Payer: Self-pay | Admitting: Family Medicine

## 2022-11-17 ENCOUNTER — Ambulatory Visit (INDEPENDENT_AMBULATORY_CARE_PROVIDER_SITE_OTHER): Payer: Medicare Other | Admitting: Family Medicine

## 2022-11-17 ENCOUNTER — Ambulatory Visit (INDEPENDENT_AMBULATORY_CARE_PROVIDER_SITE_OTHER)
Admission: RE | Admit: 2022-11-17 | Discharge: 2022-11-17 | Disposition: A | Discharge status: Home / Self Care | Payer: Medicare Other | Source: Ambulatory Visit | Attending: Family Medicine | Admitting: Family Medicine

## 2022-11-17 VITALS — BP 104/74 | HR 73 | Temp 98.5°F | Ht 70.0 in | Wt 214.0 lb

## 2022-11-17 DIAGNOSIS — W19XXXA Unspecified fall, initial encounter: Secondary | ICD-10-CM | POA: Diagnosis not present

## 2022-11-17 DIAGNOSIS — M25531 Pain in right wrist: Secondary | ICD-10-CM

## 2022-11-17 DIAGNOSIS — S52571A Other intraarticular fracture of lower end of right radius, initial encounter for closed fracture: Secondary | ICD-10-CM | POA: Diagnosis not present

## 2022-11-17 DIAGNOSIS — R58 Hemorrhage, not elsewhere classified: Secondary | ICD-10-CM

## 2022-11-17 DIAGNOSIS — S52501A Unspecified fracture of the lower end of right radius, initial encounter for closed fracture: Secondary | ICD-10-CM | POA: Diagnosis not present

## 2022-11-17 NOTE — Progress Notes (Signed)
Established Patient Office Visit   Subjective  Patient ID: Gary Osborn, male    DOB: 02-19-1933  Age: 89 y.o. MRN: [id number]  Chief Complaint  Patient presents with   Fall    Pt reports a fall last week and injured his R lower arm, Right hand and R wrist. Hurts to move wrist.    Patient is an 89 year old male with pmh sig for allergic rhinitis, primary hypogonadism, prostate cancer on tamoxifen, sinus bradycardia, anxiety, depression, gynecomastia who was seen today for acute concern.  Patient endorses tripping on sidewalk/missing a step and falling onto outstretched R arm, 11/15/2022 while attending a funeral.  Patient endorses bruising, edema, right wrist pain with bending.  Patient states he is unable to lift anything heavy with that hand.  Also with abrasions on bilateral knees.  Patient is right-handed.  Patient seen in ED on 10/31/2022 for a fall out of bed.  States healing abrasion on right posterior elbow is from that incident.  Discussed r/b/a of testosterone therapy for gynecomastia. Pt also discussed with Urology.  Pt has decided not to pursue hormone therapy given the possible risks.      ROS Negative unless stated above    Objective:     BP 104/74 (BP Location: Left Arm, Patient Position: Sitting, Cuff Size: Large)   Pulse 73   Temp 98.5 F (36.9 C) (Oral)   Ht '5\' 10"'$  (1.778 m)   Wt 214 lb (97.1 kg)   SpO2 95%   BMI 30.71 kg/m    Physical Exam Constitutional:      Appearance: Normal appearance.  HENT:     Head: Normocephalic and atraumatic.     Nose: Nose normal.     Mouth/Throat:     Mouth: Mucous membranes are moist.  Eyes:     Extraocular Movements: Extraocular movements intact.     Conjunctiva/sclera: Conjunctivae normal.     Pupils: Pupils are equal, round, and reactive to light.  Cardiovascular:     Rate and Rhythm: Normal rate.     Pulses: Normal pulses.     Heart sounds: Normal heart sounds.  Pulmonary:     Effort: Pulmonary effort is  normal.     Breath sounds: Normal breath sounds.  Musculoskeletal:     Right forearm: Swelling and edema present.     Left forearm: Normal.     Right wrist: Swelling and tenderness present.     Left wrist: Normal.     Right hand: Swelling present.       Arms:     Comments: Ecchymosis of dorsum of R hand, wrist, and lateral forearm  Skin:    General: Skin is warm and dry.          Comments: Abrasions with eschar on bilateral knees.  Neurological:     Mental Status: He is alert and oriented to person, place, and time.     Comments: Resting tremor of b/l hands.      No results found for any visits on 11/17/22.    Assessment & Plan:  Right wrist pain -Given mechanism of injury discussed obtaining x-ray of right wrist.  Patient advised if x-ray is negative and symptoms of pain remain we will need to repeat x-ray in a few weeks. -Discussed supportive care including RICE. -Further recommendations based on imaging. -     DG Wrist Complete Right; Future  Fall from standing, initial encounter  Ecchymosis -Supportive care  Closed fracture of distal end of right radius,  unspecified fracture morphology, initial encounter Update: Patient sent to [facility] office for imaging as x-ray unavailable in clinic. -X-ray right wrist, complete: Minimally displaced intra-articular distal radial fracture. Pt called.  Will place Ortho referral or have pt go to Ortho walk in clinic for sugar tong splint.  Return if symptoms worsen or fail to improve.   Billie Ruddy, MD

## 2022-11-21 DIAGNOSIS — M25531 Pain in right wrist: Secondary | ICD-10-CM | POA: Diagnosis not present

## 2022-12-02 DIAGNOSIS — K1379 Other lesions of oral mucosa: Secondary | ICD-10-CM | POA: Diagnosis not present

## 2022-12-10 ENCOUNTER — Ambulatory Visit: Payer: Medicare Other | Admitting: Family Medicine

## 2022-12-12 ENCOUNTER — Encounter: Payer: Self-pay | Admitting: Family Medicine

## 2022-12-12 ENCOUNTER — Ambulatory Visit (INDEPENDENT_AMBULATORY_CARE_PROVIDER_SITE_OTHER): Payer: Medicare Other | Admitting: Family Medicine

## 2022-12-12 VITALS — BP 124/66 | HR 59 | Temp 98.1°F | Ht 70.0 in | Wt 216.6 lb

## 2022-12-12 DIAGNOSIS — N3945 Continuous leakage: Secondary | ICD-10-CM

## 2022-12-12 DIAGNOSIS — S52501D Unspecified fracture of the lower end of right radius, subsequent encounter for closed fracture with routine healing: Secondary | ICD-10-CM

## 2022-12-12 DIAGNOSIS — E039 Hypothyroidism, unspecified: Secondary | ICD-10-CM | POA: Diagnosis not present

## 2022-12-12 DIAGNOSIS — D499 Neoplasm of unspecified behavior of unspecified site: Secondary | ICD-10-CM

## 2022-12-12 MED ORDER — LEVOTHYROXINE SODIUM 50 MCG PO TABS: 50.0000 ug | ORAL_TABLET | Freq: Every day | ORAL | 3 refills | Status: DC

## 2022-12-12 NOTE — Progress Notes (Signed)
   Established Patient Office Visit   Subjective  Patient ID: Gary Osborn, male    DOB: [birth year]  Age: 89 y.o. MRN: [id number]  Chief Complaint  Patient presents with   Medical Management of Chronic Issues    Pt is an 89 yo male with  has a past medical history of allergic rhinitis, pression, anxiety, prostate cancer, sinus bradycardia who presents for follow-up.  Patient states he is doing well overall.  Wearing wrist splint on right wrist for history of fracture.  Has appointment with Ortho next week to see if he can stop wearing the splint.  Patient had appointment with ENT for removal of precancerous lesion in mouth.  Patient also followed by urology, Dr. Selinda Eon for history of prostate cancer.  Endorses urinary leakage at night.  Wears adult briefs.  States urology monitoring small red bumps on penis.  Patient endorses taking Synthroid daily.  Inquires about 90-day supply as he feels like he has not given many pills.      ROS Negative unless stated above    Objective:     BP 124/66 (BP Location: Left Arm, Patient Position: Sitting, Cuff Size: Large)   Pulse (!) 59   Temp 98.1 F (36.7 C) (Oral)   Ht '5\' 10"'$  (1.778 m)   Wt 216 lb 9.6 oz (98.2 kg)   SpO2 96%   BMI 31.08 kg/m    Physical Exam Constitutional:      General: He is not in acute distress.    Appearance: Normal appearance.  HENT:     Head: Normocephalic and atraumatic.     Nose: Nose normal.     Mouth/Throat:     Mouth: Mucous membranes are moist.  Cardiovascular:     Rate and Rhythm: Normal rate and regular rhythm.     Heart sounds: Normal heart sounds. No murmur heard.    No gallop.  Pulmonary:     Effort: Pulmonary effort is normal. No respiratory distress.     Breath sounds: Normal breath sounds. No wheezing, rhonchi or rales.  Musculoskeletal:     Comments: Splint on right wrist  Skin:    General: Skin is warm and dry.  Neurological:     Mental Status: He is alert and oriented to person, place,  and time.    No results found for any visits on 12/12/22.    Assessment & Plan:  Acquired hypothyroidism -stable -continue synthroid 50 mcg daily.  Rx previously written for 90 days.  Resent to pharmacy requesting 90-day supply for patient. -     Levothyroxine Sodium; Take 1 tablet (50 mcg total) by mouth daily before breakfast.  Dispense: 90 tablet; Refill: 3  Closed fracture of distal end of right radius with routine healing, unspecified fracture morphology, subsequent encounter -stable -continue wrist splint -encouraged to keep f/u with Ortho next wk  Precancerous lesion -removed -continue f/u with ENT  Continuous leakage of urine -continue f/u with Urology    Return in about 6 months (around 06/14/2023), or sooner if symptoms worsen or fail to improve.   Billie Ruddy, MD

## 2022-12-12 NOTE — Patient Instructions (Signed)
I reordered your Synthroid 50 mcg so that they give you a 90-day supply at a time.  Check with the pharmacy to make sure they do this.

## 2022-12-15 DIAGNOSIS — F251 Schizoaffective disorder, depressive type: Secondary | ICD-10-CM | POA: Diagnosis not present

## 2022-12-16 DIAGNOSIS — M25531 Pain in right wrist: Secondary | ICD-10-CM | POA: Diagnosis not present

## 2023-01-27 DIAGNOSIS — M25531 Pain in right wrist: Secondary | ICD-10-CM | POA: Diagnosis not present

## 2023-02-24 DIAGNOSIS — L821 Other seborrheic keratosis: Secondary | ICD-10-CM | POA: Diagnosis not present

## 2023-02-24 DIAGNOSIS — L814 Other melanin hyperpigmentation: Secondary | ICD-10-CM | POA: Diagnosis not present

## 2023-02-24 DIAGNOSIS — L570 Actinic keratosis: Secondary | ICD-10-CM | POA: Diagnosis not present

## 2023-02-24 DIAGNOSIS — D1801 Hemangioma of skin and subcutaneous tissue: Secondary | ICD-10-CM | POA: Diagnosis not present

## 2023-03-05 DIAGNOSIS — K1379 Other lesions of oral mucosa: Secondary | ICD-10-CM | POA: Diagnosis not present

## 2023-03-11 DIAGNOSIS — Z8546 Personal history of malignant neoplasm of prostate: Secondary | ICD-10-CM | POA: Diagnosis not present

## 2023-03-12 DIAGNOSIS — H401131 Primary open-angle glaucoma, bilateral, mild stage: Secondary | ICD-10-CM | POA: Diagnosis not present

## 2023-03-18 DIAGNOSIS — Z8546 Personal history of malignant neoplasm of prostate: Secondary | ICD-10-CM | POA: Diagnosis not present

## 2023-03-18 DIAGNOSIS — E291 Testicular hypofunction: Secondary | ICD-10-CM | POA: Diagnosis not present

## 2023-03-18 DIAGNOSIS — N471 Phimosis: Secondary | ICD-10-CM | POA: Diagnosis not present

## 2023-03-18 DIAGNOSIS — N393 Stress incontinence (female) (male): Secondary | ICD-10-CM | POA: Diagnosis not present

## 2023-03-18 DIAGNOSIS — N481 Balanitis: Secondary | ICD-10-CM | POA: Diagnosis not present

## 2023-03-24 DIAGNOSIS — L814 Other melanin hyperpigmentation: Secondary | ICD-10-CM | POA: Diagnosis not present

## 2023-03-24 DIAGNOSIS — L820 Inflamed seborrheic keratosis: Secondary | ICD-10-CM | POA: Diagnosis not present

## 2023-03-24 DIAGNOSIS — D1801 Hemangioma of skin and subcutaneous tissue: Secondary | ICD-10-CM | POA: Diagnosis not present

## 2023-03-24 DIAGNOSIS — L821 Other seborrheic keratosis: Secondary | ICD-10-CM | POA: Diagnosis not present

## 2023-05-14 DIAGNOSIS — F251 Schizoaffective disorder, depressive type: Secondary | ICD-10-CM | POA: Diagnosis not present

## 2023-05-26 DIAGNOSIS — L570 Actinic keratosis: Secondary | ICD-10-CM | POA: Diagnosis not present

## 2023-05-26 DIAGNOSIS — L820 Inflamed seborrheic keratosis: Secondary | ICD-10-CM | POA: Diagnosis not present

## 2023-05-26 DIAGNOSIS — L814 Other melanin hyperpigmentation: Secondary | ICD-10-CM | POA: Diagnosis not present

## 2023-05-26 DIAGNOSIS — L821 Other seborrheic keratosis: Secondary | ICD-10-CM | POA: Diagnosis not present

## 2023-06-05 DIAGNOSIS — K1379 Other lesions of oral mucosa: Secondary | ICD-10-CM | POA: Diagnosis not present

## 2023-06-10 ENCOUNTER — Ambulatory Visit (INDEPENDENT_AMBULATORY_CARE_PROVIDER_SITE_OTHER): Payer: Medicare Other

## 2023-06-10 VITALS — BP 118/62 | HR 64 | Temp 97.7°F | Ht 70.0 in | Wt 222.2 lb

## 2023-06-10 DIAGNOSIS — Z0000 Encounter for general adult medical examination without abnormal findings: Secondary | ICD-10-CM

## 2023-06-10 NOTE — Progress Notes (Signed)
Subjective:   Gary Osborn is a 89 y.o. male who presents for Medicare Annual/Subsequent preventive examination.  Visit Complete: In person   Review of Systems     Cardiac Risk Factors include: advanced age (>76men, >49 women);male gender     Objective:    Today's Vitals   06/10/23 1346  BP: 118/62  Pulse: 64  Temp: 97.7 F (36.5 C)  TempSrc: Oral  SpO2: 92%  Weight: 222 lb 3.2 oz (100.8 kg)  Height: 5\' 10"  (1.778 m)   Body mass index is 31.88 kg/m.     06/10/2023    2:05 PM 10/31/2022   11:02 AM 06/06/2022   12:48 PM 08/09/2021   11:06 AM 06/05/2021    9:05 AM 06/12/2020    9:52 AM 09/14/2014    6:00 PM  Advanced Directives  Does Patient Have a Medical Advance Directive? Yes No Yes Yes Yes Yes   Type of Estate agent of [place];Living will  Healthcare Power of [place];Living will Healthcare Power of [place];Living will  Healthcare Power of [place];Living will   Does patient want to make changes to medical advance directive?   No - Patient declined   No - Patient declined   Copy of Healthcare Power of Attorney in Chart? No - copy requested  No - copy requested No - copy requested  No - copy requested   Would patient like information on creating a medical advance directive?  No - Patient declined          Information is confidential and restricted. Go to Review Flowsheets to unlock data.    Current Medications (verified) Outpatient Encounter Medications as of 06/10/2023  Medication Sig   busPIRone (BUSPAR) 10 MG tablet Take 10 mg by mouth 2 (two) times daily. Take 2 tabs twice daily   clonazePAM (KLONOPIN) 0.5 MG tablet Take 1 tablet (0.5 mg total) by mouth 2 (two) times daily as needed. For severe anxiety   docusate sodium (COLACE) 100 MG capsule Take 100 mg by mouth daily.   dorzolamide-timolol (COSOPT) 22.3-6.8 MG/ML ophthalmic solution Place 1 drop into both eyes 2 (two) times daily.   levothyroxine (SYNTHROID) 50 MCG tablet Take 1  tablet (50 mcg total) by mouth daily before breakfast.   mirtazapine (REMERON) 15 MG tablet Take 15 mg by mouth at bedtime.   mirtazapine (REMERON) 45 MG tablet Take 0.5 tablets (22.5 mg total) by mouth at bedtime. For depression (Patient not taking: Reported on 12/12/2022)   Multiple Vitamins-Minerals (CENTRUM SILVER PO) Take by mouth daily.   nystatin cream (MYCOSTATIN) Apply 1 Application topically 2 (two) times daily.   PARoxetine (PAXIL) 40 MG tablet Take 1 tablet (40 mg total) by mouth at bedtime. For depression   tamoxifen (NOLVADEX) 10 MG tablet Take 10 mg by mouth daily.   No facility-administered encounter medications on file as of 06/10/2023.    Allergies (verified) Cats claw [uncaria tomentosa (cats claw)]   History: Past Medical History:  Diagnosis Date   ALLERGIC RHINITIS 10/05/2007   ANXIETY 10/05/2007   COLONIC POLYPS, HX OF 10/05/2007   DEPRESSION 10/04/2008   PROSTATE CANCER, UNSPEC. 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   Past Surgical History:  Procedure Laterality Date   NASAL SINUS SURGERY     PROSTATE SURGERY     singulotomy     for OCD   TONSILLECTOMY     Family History  Problem Relation Age of Onset   Prostate cancer Father    Breast cancer Sister  Social History   Socioeconomic History   Marital status: Married    Spouse name: Not on file   Number of children: 2   Years of education: Not on file   Highest education level: Not on file  Occupational History   Occupation: Retired    Associate Professor: RETIRED  Tobacco Use   Smoking status: Former    Current packs/day: 0.00    Types: Cigarettes    Quit date: 10/13/1973    Years since quitting: 49.6   Smokeless tobacco: Never  Substance and Sexual Activity   Alcohol use: Yes    Alcohol/week: 28.0 standard drinks of alcohol    Types: 14 Cans of beer, 14 Standard drinks or equivalent per week    Comment: glass of chardonnay each night   Drug use: No   Sexual activity: Not on file  Other Topics  Concern   Not on file  Social History Narrative   Not on file   Social Determinants of Health   Financial Resource Strain: Low Risk  (06/10/2023)   Overall Financial Resource Strain (CARDIA)    Difficulty of Paying Living Expenses: Not hard at all  Food Insecurity: No Food Insecurity (06/10/2023)   Hunger Vital Sign    Worried About Running Out of Food in the Last Year: Never true    Ran Out of Food in the Last Year: Never true  Transportation Needs: No Transportation Needs (06/10/2023)   PRAPARE - Administrator, Civil Service (Medical): No    Lack of Transportation (Non-Medical): No  Physical Activity: Insufficiently Active (06/10/2023)   Exercise Vital Sign    Days of Exercise per Week: 7 days    Minutes of Exercise per Session: 20 min  Stress: No Stress Concern Present (06/10/2023)   [employer] of Occupational Health - Occupational Stress Questionnaire    Feeling of Stress : Not at all  Social Connections: Moderately Isolated (06/10/2023)   Social Connection and Isolation Panel [NHANES]    Frequency of Communication with Friends and Family: More than three times a week    Frequency of Social Gatherings with Friends and Family: More than three times a week    Attends Religious Services: Never    Database administrator or Organizations: No    Attends Engineer, structural: Never    Marital Status: Married    Tobacco Counseling Counseling given: Not Answered   Clinical Intake:  Pre-visit preparation completed: Yes  Pain : No/denies pain     BMI - recorded: 31.88 Nutritional Status: BMI > 30  Obese Nutritional Risks: None Diabetes: No  How often do you need to have someone help you when you read instructions, pamphlets, or other written materials from your doctor or pharmacy?: 1 - Never  Interpreter Needed?: No  Information entered by :: Theresa Mulligan LPN   Activities of Daily Living    06/10/2023    2:03 PM  In your present state of  health, do you have any difficulty performing the following activities:  Hearing? 0  Vision? 0  Difficulty concentrating or making decisions? 0  Walking or climbing stairs? 0  Dressing or bathing? 0  Doing errands, shopping? 0  Preparing Food and eating ? N  Using the Toilet? N  In the past six months, have you accidently leaked urine? Y  Comment Wears breifs. Followed by Urologist  Do you have problems with loss of bowel control? N  Managing your Medications? N  Managing your Finances? N  Housekeeping or managing your Housekeeping? N    Patient Care Team: Deeann Saint, MD as PCP - General (Family Medicine) Archer Asa, MD as Consulting Physician (Psychiatry)  Indicate any recent Medical Services you may have received from other than [facility] providers in the past year (date may be approximate).     Assessment:   This is a routine wellness examination for Gary Osborn.  Hearing/Vision screen Hearing Screening - Comments:: Denies hearing difficulties   Vision Screening - Comments:: Wears rx glasses - up to date with routine eye exams with  Maggie Schwalbe  Dietary issues and exercise activities discussed:     Goals Addressed               This Visit's Progress     Stay Healthy (pt-stated)        Continue to enjoy Life.       Depression Screen    06/10/2023    1:46 PM 11/17/2022    2:35 PM 06/09/2022    8:17 AM 06/06/2022   12:42 PM 06/05/2021    9:06 AM 06/12/2020    9:55 AM 06/04/2020    8:16 AM  PHQ 2/9 Scores  PHQ - 2 Score 0 0 0 0 0 0 0  PHQ- 9 Score  0 0   0     Fall Risk    06/10/2023    2:05 PM 11/17/2022    2:36 PM 06/09/2022    8:16 AM 06/06/2022   12:47 PM 06/05/2021    9:06 AM  Fall Risk   Falls in the past year? 0 1 0 0 1  Number falls in past yr: 0 1 0 0 0  Injury with Fall? 0 1 0 0 0  Risk for fall due to : No Fall Risks Other (Comment) No Fall Risks No Fall Risks   Follow up Falls prevention discussed Falls evaluation completed Falls evaluation  completed Falls prevention discussed     MEDICARE RISK AT HOME: Medicare Risk at Home Any stairs in or around the home?: Yes If so, are there any without handrails?: No Home free of loose throw rugs in walkways, pet beds, electrical cords, etc?: Yes Adequate lighting in your home to reduce risk of falls?: Yes Life alert?: Yes Use of a cane, walker or w/c?: No Grab bars in the bathroom?: Yes Shower chair or bench in shower?: Yes Elevated toilet seat or a handicapped toilet?: Yes  TIMED UP AND GO:  Was the test performed?  Yes  Length of time to ambulate 10 feet: 10 sec Gait slow and steady without use of assistive device    Cognitive Function:        06/10/2023    2:06 PM 06/06/2022   12:48 PM 06/12/2020    9:57 AM  6CIT Screen  What Year? 0 points 0 points 0 points  What month? 0 points 0 points 0 points  What time? 0 points 0 points 0 points  Count back from 20 0 points 0 points 0 points  Months in reverse 0 points 0 points 0 points  Repeat phrase 0 points 0 points 0 points  Total Score 0 points 0 points 0 points    Immunizations Immunization History  Administered Date(s) Administered   Fluad Quad(high Dose 65+) 07/09/2019   Influenza Split 08/06/2011, 07/08/2012   Influenza Whole 07/29/2007, 07/26/2008, 07/10/2009, 06/27/2010   Influenza, High Dose Seasonal PF 07/06/2015, 06/30/2018, 07/16/2020   Influenza,inj,Quad PF,6+ Mos 06/22/2013, 06/29/2014   Influenza-Unspecified 07/04/2016, 07/22/2017, 07/25/2021  Moderna Sars-Covid-2 Vaccination 10/17/2019, 11/14/2019   Pneumococcal Conjugate-13 03/30/2015   Pneumococcal Polysaccharide-23 07/14/2003   Td 10/19/2009   Zoster, Live 12/13/2008    TDAP status: Due, Education has been provided regarding the importance of this vaccine. Advised may receive this vaccine at local pharmacy or Health Dept. Aware to provide a copy of the vaccination record if obtained from local pharmacy or Health Dept. Verbalized acceptance and  understanding.  Flu Vaccine status: Due, Education has been provided regarding the importance of this vaccine. Advised may receive this vaccine at local pharmacy or Health Dept. Aware to provide a copy of the vaccination record if obtained from local pharmacy or Health Dept. Verbalized acceptance and understanding.  Pneumococcal vaccine status: Up to date  Covid-19 vaccine status: Declined, Education has been provided regarding the importance of this vaccine but patient still declined. Advised may receive this vaccine at local pharmacy or Health Dept.or vaccine clinic. Aware to provide a copy of the vaccination record if obtained from local pharmacy or Health Dept. Verbalized acceptance and understanding.  Qualifies for Shingles Vaccine? Yes   Zostavax completed No   Shingrix Completed?: No.    Education has been provided regarding the importance of this vaccine. Patient has been advised to call insurance company to determine out of pocket expense if they have not yet received this vaccine. Advised may also receive vaccine at local pharmacy or Health Dept. Verbalized acceptance and understanding.  Screening Tests Health Maintenance  Topic Date Due   Zoster Vaccines- Shingrix (1 of 2) 12/18/1951   DTaP/Tdap/Td (2 - Tdap) 10/20/2019   COVID-19 Vaccine (3 - Moderna risk series) 12/12/2019   INFLUENZA VACCINE  05/14/2023   Medicare Annual Wellness (AWV)  06/09/2024   Pneumonia Vaccine 63+ Years old  Completed   HPV VACCINES  Aged Out    Health Maintenance  Health Maintenance Due  Topic Date Due   Zoster Vaccines- Shingrix (1 of 2) 12[birth year]18/1951   DTaP/Tdap/Td (2 - Tdap) 10/20/2019   COVID-19 Vaccine (3 - Moderna risk series) 12/12/2019   INFLUENZA VACCINE  05/14/2023      Additional Screening:  Hepatitis C Screening: does not qualify;   Vision Screening: Recommended annual ophthalmology exams for early detection of glaucoma and other disorders of the eye. Is the patient up to date  with their annual eye exam?  Yes  Who is the provider or what is the name of the office in which the patient attends annual eye exams? [facility] If pt is not established with a provider, would they like to be referred to a provider to establish care? No .   Dental Screening: Recommended annual dental exams for proper oral hygiene    Community Resource Referral / Chronic Care Management:  CRR required this visit?  No   CCM required this visit?  No     Plan:     I have personally reviewed and noted the following in the patient's chart:   Medical and social history Use of alcohol, tobacco or illicit drugs  Current medications and supplements including opioid prescriptions. Patient is not currently taking opioid prescriptions. Functional ability and status Nutritional status Physical activity Advanced directives List of other physicians Hospitalizations, surgeries, and ER visits in previous 12 months Vitals Screenings to include cognitive, depression, and falls Referrals and appointments  In addition, I have reviewed and discussed with patient certain preventive protocols, quality metrics, and best practice recommendations. A written personalized care plan for preventive services as well as general preventive  health recommendations were provided to patient.     Tillie Rung, LPN   11/12/8655   After Visit Summary: Given  Nurse Notes: None

## 2023-06-10 NOTE — Patient Instructions (Addendum)
Gary Osborn , Thank you for taking time to come for your Medicare Wellness Visit. I appreciate your ongoing commitment to your health goals. Please review the following plan we discussed and let me know if I can assist you in the future.   Referrals/Orders/Follow-Ups/Clinician Recommendations:   This is a list of the screening recommended for you and due dates:  Health Maintenance  Topic Date Due   Zoster (Shingles) Vaccine (1 of 2) 12/18/1951   DTaP/Tdap/Td vaccine (2 - Tdap) 10/20/2019   COVID-19 Vaccine (3 - Moderna risk series) 12/12/2019   Flu Shot  05/14/2023   Medicare Annual Wellness Visit  06/09/2024   Pneumonia Vaccine  Completed   HPV Vaccine  Aged Out    Advanced directives: (Copy Requested) Please bring a copy of your health care power of attorney and living will to the office to be added to your chart at your convenience.  Next Medicare Annual Wellness Visit scheduled for next year: Yes

## 2023-06-11 ENCOUNTER — Encounter: Payer: Self-pay | Admitting: Family Medicine

## 2023-06-11 ENCOUNTER — Ambulatory Visit (INDEPENDENT_AMBULATORY_CARE_PROVIDER_SITE_OTHER): Payer: Medicare Other | Admitting: Family Medicine

## 2023-06-11 VITALS — BP 118/65 | HR 56 | Temp 98.7°F | Ht 70.0 in | Wt 218.4 lb

## 2023-06-11 DIAGNOSIS — Z8546 Personal history of malignant neoplasm of prostate: Secondary | ICD-10-CM | POA: Diagnosis not present

## 2023-06-11 DIAGNOSIS — E039 Hypothyroidism, unspecified: Secondary | ICD-10-CM | POA: Diagnosis not present

## 2023-06-11 DIAGNOSIS — E661 Drug-induced obesity: Secondary | ICD-10-CM

## 2023-06-11 DIAGNOSIS — N62 Hypertrophy of breast: Secondary | ICD-10-CM

## 2023-06-11 DIAGNOSIS — E66811 Obesity, class 1: Secondary | ICD-10-CM

## 2023-06-11 DIAGNOSIS — Z8659 Personal history of other mental and behavioral disorders: Secondary | ICD-10-CM | POA: Diagnosis not present

## 2023-06-11 DIAGNOSIS — Z6831 Body mass index (BMI) 31.0-31.9, adult: Secondary | ICD-10-CM | POA: Diagnosis not present

## 2023-06-11 LAB — COMPREHENSIVE METABOLIC PANEL
ALT: 19 U/L (ref 0–53)
AST: 17 U/L (ref 0–37)
Albumin: 4.1 g/dL (ref 3.5–5.2)
Alkaline Phosphatase: 81 U/L (ref 39–117)
BUN: 19 mg/dL (ref 6–23)
CO2: 27 mEq/L (ref 19–32)
Calcium: 9.3 mg/dL (ref 8.4–10.5)
Chloride: 105 mEq/L (ref 96–112)
Creatinine, Ser: 1.13 mg/dL (ref 0.40–1.50)
GFR: 58.04 mL/min — ABNORMAL LOW (ref >60.00)
Glucose, Bld: 88 mg/dL (ref 70–99)
Potassium: 4.4 mEq/L (ref 3.5–5.1)
Sodium: 139 mEq/L (ref 135–145)
Total Bilirubin: 0.8 mg/dL (ref 0.2–1.2)
Total Protein: 6.9 g/dL (ref 6.0–8.3)

## 2023-06-11 LAB — CBC WITH DIFFERENTIAL/PLATELET
Basophils Absolute: 0 10*3/uL (ref 0.0–0.1)
Basophils Relative: 0.8 % (ref 0.0–3.0)
Eosinophils Absolute: 0.1 10*3/uL (ref 0.0–0.7)
Eosinophils Relative: 2.4 % (ref 0.0–5.0)
HCT: 40.2 % (ref 39.0–52.0)
Hemoglobin: 13.3 g/dL (ref 13.0–17.0)
Lymphocytes Relative: 25.6 % (ref 12.0–46.0)
Lymphs Abs: 1.1 10*3/uL (ref 0.7–4.0)
MCHC: 33 g/dL (ref 30.0–36.0)
MCV: 97.9 fl (ref 78.0–100.0)
Monocytes Absolute: 0.4 10*3/uL (ref 0.1–1.0)
Monocytes Relative: 10.1 % (ref 3.0–12.0)
Neutro Abs: 2.5 10*3/uL (ref 1.4–7.7)
Neutrophils Relative %: 61.1 % (ref 43.0–77.0)
Platelets: 189 10*3/uL (ref 150.0–400.0)
RBC: 4.1 Mil/uL — ABNORMAL LOW (ref 4.22–5.81)
RDW: 14.1 % (ref 11.5–15.5)
WBC: 4.2 10*3/uL (ref 4.0–10.5)

## 2023-06-11 LAB — LIPID PANEL
Cholesterol: 135 mg/dL (ref 0–200)
HDL: 46.6 mg/dL (ref >39.00)
LDL Cholesterol: 71 mg/dL (ref 0–99)
NonHDL: 88.57
Total CHOL/HDL Ratio: 3
Triglycerides: 89 mg/dL (ref 0.0–149.0)
VLDL: 17.8 mg/dL (ref 0.0–40.0)

## 2023-06-11 LAB — TSH: TSH: 2.42 u[IU]/mL (ref 0.35–5.50)

## 2023-06-11 NOTE — Progress Notes (Signed)
Established Patient Office Visit   Subjective  Patient ID: Gary Osborn, male    DOB: Mar 03, 1933  Age: 89 y.o. MRN: [id number]  Chief Complaint  Patient presents with   Annual Exam    Patient is an 89 year old male seen for follow-up on chronic conditions.  Patient had AWV a few weeks ago.  States doing well overall.  Had follow-up with urology.  Things are stable status post prostatectomy for history of prostate cancer.  Patient having urinary leakage which causes occasional rash on penis.  Using a cream as needed.  Patient followed by psychiatry.  States forgets to take a.m. meds but will take medications at night.  Thinks they may be helping but unsure.  Patient states appetite, sleep, energy are good.  Followed by dermatology.  Has regular skin checks.    Past Medical History:  Diagnosis Date   ALLERGIC RHINITIS 10/05/2007   ANXIETY 10/05/2007   COLONIC POLYPS, HX OF 10/05/2007   DEPRESSION 10/04/2008   PROSTATE CANCER, UNSPEC. 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   Past Surgical History:  Procedure Laterality Date   NASAL SINUS SURGERY     PROSTATE SURGERY     singulotomy     for OCD   TONSILLECTOMY     Social History   Tobacco Use   Smoking status: Former    Current packs/day: 0.00    Types: Cigarettes    Quit date: 10/13/1973    Years since quitting: 49.6   Smokeless tobacco: Never  Substance Use Topics   Alcohol use: Yes    Alcohol/week: 28.0 standard drinks of alcohol    Types: 14 Cans of beer, 14 Standard drinks or equivalent per week    Comment: glass of chardonnay each night   Drug use: No   Family History  Problem Relation Age of Onset   Prostate cancer Father    Breast cancer Sister    Allergies  Allergen Reactions   Cats Claw [Uncaria [place]osa (Cats Claw)]     PT IS ALLERGIC TO CATS/ DOES NOT HAVE ANY      ROS Negative unless stated above    Objective:     BP 118/65 (BP Location: Right Arm, Patient Position: Sitting, Cuff Size:  Normal)   Pulse (!) 56   Temp 98.7 F (37.1 C) (Oral)   Ht 5\' 10"  (1.778 m)   Wt 218 lb 6.4 oz (99.1 kg)   SpO2 98%   BMI 31.34 kg/m  BP Readings from Last 3 Encounters:  06/11/23 118/65  06/10/23 118/62  12/12/22 124/66   Wt Readings from Last 3 Encounters:  06/11/23 218 lb 6.4 oz (99.1 kg)  06/10/23 222 lb 3.2 oz (100.8 kg)  12/12/22 216 lb 9.6 oz (98.2 kg)      Physical Exam Constitutional:      Appearance: Normal appearance.  HENT:     Head: Normocephalic and atraumatic.     Right Ear: Tympanic membrane, ear canal and external ear normal.     Left Ear: Tympanic membrane, ear canal and external ear normal.     Nose: Nose normal.     Mouth/Throat:     Mouth: Mucous membranes are moist.     Pharynx: No oropharyngeal exudate or posterior oropharyngeal erythema.     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 Eyes:     General: No scleral icterus.    Extraocular Movements: Extraocular movements intact.     Conjunctiva/sclera: Conjunctivae normal.     Pupils: Pupils  are equal, round, and reactive to light.  Neck:     Thyroid: No thyromegaly.  Cardiovascular:     Rate and Rhythm: Normal rate and regular rhythm.     Pulses: Normal pulses.     Heart sounds: Normal heart sounds. No murmur heard.    No friction rub.  Pulmonary:     Effort: Pulmonary effort is normal.     Breath sounds: Normal breath sounds. No wheezing, rhonchi or rales.  Abdominal:     General: Bowel sounds are normal.     Palpations: Abdomen is soft.     Tenderness: There is no abdominal tenderness.  Musculoskeletal:        General: No deformity. Normal range of motion.  Lymphadenopathy:     Cervical: No cervical adenopathy.  Skin:    General: Skin is warm and dry.     Findings: No lesion.     Comments: Small areas of actinic keratosis noted on left lateral arm/elbow.  Neurological:     General: No focal deficit present.     Mental Status: He is alert and oriented to person, place, and  time.  Psychiatric:        Mood and Affect: Mood normal.        Thought Content: Thought content normal.        06/11/2023    8:13 AM 06/10/2023    1:46 PM 11/17/2022    2:35 PM  Depression screen PHQ 2/9  Decreased Interest 0 0 0  Down, Depressed, Hopeless 0 0 0  PHQ - 2 Score 0 0 0  Altered sleeping 0  0  Tired, decreased energy 0  0  Change in appetite 0  0  Feeling bad or failure about yourself  0  0  Trouble concentrating 0  0  Moving slowly or fidgety/restless 0  0  Suicidal thoughts 0  0  PHQ-9 Score 0  0      06/11/2023    8:13 AM 11/17/2022    2:35 PM  GAD 7 : Generalized Anxiety Score  Nervous, Anxious, on Edge 0 0  Control/stop worrying 0 0  Worry too much - different things 0 0  Trouble relaxing 0 0  Restless 0 0  Easily annoyed or irritable 0 0  Afraid - awful might happen 0 0  Total GAD 7 Score 0 0  Anxiety Difficulty Not difficult at all      No results found for any visits on 06/11/23.    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  Age-appropriate health screenings discussed.  Chronic medical conditions stable.  Continue follow-up with specialist including urology, dermatology, psychiatry, endocrinology.  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  Patient will have influenza and updated COVID-vaccine at [facility].   Return in about 6 months (around 12/11/2023) for chronic conditions.   Deeann Saint, MD

## 2023-06-16 DIAGNOSIS — H2512 Age-related nuclear cataract, left eye: Secondary | ICD-10-CM | POA: Diagnosis not present

## 2023-06-16 DIAGNOSIS — H401131 Primary open-angle glaucoma, bilateral, mild stage: Secondary | ICD-10-CM | POA: Diagnosis not present

## 2023-06-17 ENCOUNTER — Encounter: Payer: Self-pay | Admitting: Family Medicine

## 2023-07-29 DIAGNOSIS — Z23 Encounter for immunization: Secondary | ICD-10-CM | POA: Diagnosis not present

## 2023-08-12 DIAGNOSIS — Z23 Encounter for immunization: Secondary | ICD-10-CM | POA: Diagnosis not present

## 2023-09-07 DIAGNOSIS — K1379 Other lesions of oral mucosa: Secondary | ICD-10-CM | POA: Diagnosis not present

## 2023-09-07 DIAGNOSIS — K1321 Leukoplakia of oral mucosa, including tongue: Secondary | ICD-10-CM | POA: Diagnosis not present

## 2023-09-15 DIAGNOSIS — H401131 Primary open-angle glaucoma, bilateral, mild stage: Secondary | ICD-10-CM | POA: Diagnosis not present

## 2023-09-15 DIAGNOSIS — H04123 Dry eye syndrome of bilateral lacrimal glands: Secondary | ICD-10-CM | POA: Diagnosis not present

## 2023-09-20 ENCOUNTER — Other Ambulatory Visit: Payer: Self-pay

## 2023-09-20 ENCOUNTER — Emergency Department (HOSPITAL_BASED_OUTPATIENT_CLINIC_OR_DEPARTMENT_OTHER)
Admission: EM | Admit: 2023-09-20 | Discharge: 2023-09-20 | Disposition: A | Discharge status: Home / Self Care | Payer: Medicare Other | Attending: Emergency Medicine | Admitting: Emergency Medicine

## 2023-09-20 ENCOUNTER — Emergency Department (HOSPITAL_BASED_OUTPATIENT_CLINIC_OR_DEPARTMENT_OTHER): Payer: Medicare Other | Admitting: Radiology

## 2023-09-20 ENCOUNTER — Encounter (HOSPITAL_BASED_OUTPATIENT_CLINIC_OR_DEPARTMENT_OTHER): Payer: Self-pay | Admitting: Emergency Medicine

## 2023-09-20 DIAGNOSIS — Z79899 Other long term (current) drug therapy: Secondary | ICD-10-CM | POA: Insufficient documentation

## 2023-09-20 DIAGNOSIS — X58XXXA Exposure to other specified factors, initial encounter: Secondary | ICD-10-CM | POA: Diagnosis not present

## 2023-09-20 DIAGNOSIS — S299XXA Unspecified injury of thorax, initial encounter: Secondary | ICD-10-CM | POA: Diagnosis present

## 2023-09-20 DIAGNOSIS — R0781 Pleurodynia: Secondary | ICD-10-CM | POA: Diagnosis not present

## 2023-09-20 DIAGNOSIS — S29011A Strain of muscle and tendon of front wall of thorax, initial encounter: Secondary | ICD-10-CM | POA: Insufficient documentation

## 2023-09-20 DIAGNOSIS — R0789 Other chest pain: Secondary | ICD-10-CM | POA: Diagnosis not present

## 2023-09-20 LAB — COMPREHENSIVE METABOLIC PANEL
ALT: 23 U/L (ref 0–44)
AST: 19 U/L (ref 15–41)
Albumin: 3.8 g/dL (ref 3.5–5.0)
Alkaline Phosphatase: 77 U/L (ref 38–126)
Anion gap: 7 (ref 5–15)
BUN: 17 mg/dL (ref 8–23)
CO2: 26 mmol/L (ref 22–32)
Calcium: 8.6 mg/dL — ABNORMAL LOW (ref 8.9–10.3)
Chloride: 107 mmol/L (ref 98–111)
Creatinine, Ser: 1.29 mg/dL — ABNORMAL HIGH (ref 0.61–1.24)
GFR, Estimated: 53 mL/min — ABNORMAL LOW (ref >60)
Glucose, Bld: 103 mg/dL — ABNORMAL HIGH (ref 70–99)
Potassium: 4.1 mmol/L (ref 3.5–5.1)
Sodium: 140 mmol/L (ref 135–145)
Total Bilirubin: 0.6 mg/dL (ref <1.2)
Total Protein: 6.4 g/dL — ABNORMAL LOW (ref 6.5–8.1)

## 2023-09-20 LAB — CBC WITH DIFFERENTIAL/PLATELET
Abs Immature Granulocytes: 0.01 10*3/uL (ref 0.00–0.07)
Basophils Absolute: 0 10*3/uL (ref 0.0–0.1)
Basophils Relative: 1 %
Eosinophils Absolute: 0.1 10*3/uL (ref 0.0–0.5)
Eosinophils Relative: 3 %
HCT: 37.8 % — ABNORMAL LOW (ref 39.0–52.0)
Hemoglobin: 12.5 g/dL — ABNORMAL LOW (ref 13.0–17.0)
Immature Granulocytes: 0 %
Lymphocytes Relative: 26 %
Lymphs Abs: 0.8 10*3/uL (ref 0.7–4.0)
MCH: 32.3 pg (ref 26.0–34.0)
MCHC: 33.1 g/dL (ref 30.0–36.0)
MCV: 97.7 fL (ref 80.0–100.0)
Monocytes Absolute: 0.3 10*3/uL (ref 0.1–1.0)
Monocytes Relative: 8 %
Neutro Abs: 2 10*3/uL (ref 1.7–7.7)
Neutrophils Relative %: 62 %
Platelets: 166 10*3/uL (ref 150–400)
RBC: 3.87 MIL/uL — ABNORMAL LOW (ref 4.22–5.81)
RDW: 13.7 % (ref 11.5–15.5)
WBC: 3.2 10*3/uL — ABNORMAL LOW (ref 4.0–10.5)
nRBC: 0 % (ref 0.0–0.2)

## 2023-09-20 LAB — TROPONIN I (HIGH SENSITIVITY)
Troponin I (High Sensitivity): 3 ng/L (ref <18)
Troponin I (High Sensitivity): 2 ng/L (ref <18)

## 2023-09-20 MED ORDER — LIDOCAINE 5 % EX PTCH
1.0000 | MEDICATED_PATCH | CUTANEOUS | Status: DC
  Administered 2023-09-20: 1 via TRANSDERMAL
  Filled 2023-09-20: qty 1

## 2023-09-20 MED ORDER — LIDOCAINE 4 % EX PTCH: 1.0000 | MEDICATED_PATCH | CUTANEOUS | 0 refills | Status: AC

## 2023-09-20 NOTE — ED Provider Notes (Signed)
[place] EMERGENCY DEPARTMENT AT [facility] Provider Note   CSN: [id number] Arrival date & time: 09/20/23  8469     History  No chief complaint on file.   Gary Osborn is a 89 y.o. male, history of sinus bradycardia, schizoaffective disorder, who presents to the ED secondary to left chest pain, going on for the last few days.  He states that earlier this week, he may have lifted up some heavy book bags, but he did not have pain right away.  He states that the next day, he noticed, his left chest wall, was sore, but denies any nausea, vomiting, shortness of breath, or left arm pain or jaw pain.  States the pain is persistent, and only hurts when he presses on it.  Rates it a 3 out of 10, and is kind of just dull and aching.  No history of heart attacks.  Has not had any recent surgeries.    Home Medications Prior to Admission medications   Medication Sig Start Date End Date Taking? Authorizing Provider  lidocaine (HM LIDOCAINE PATCH) 4 % Place 1 patch onto the skin daily. 09/20/23  Yes Inesha Sow L, PA  busPIRone (BUSPAR) 10 MG tablet Take 10 mg by mouth 2 (two) times daily. Take 2 tabs twice daily    [provider]  clonazePAM (KLONOPIN) 0.5 MG tablet Take 1 tablet (0.5 mg total) by mouth 2 (two) times daily as needed. For severe anxiety 03/02/15   Clapacs, Jackquline Denmark, MD  docusate sodium (COLACE) 100 MG capsule Take 100 mg by mouth daily.    [provider]  dorzolamide-timolol (COSOPT) 22.3-6.8 MG/ML ophthalmic solution Place 1 drop into both eyes 2 (two) times daily. 05/12/21   [provider]  levothyroxine (SYNTHROID) 50 MCG tablet Take 1 tablet (50 mcg total) by mouth daily before breakfast. 12/12/22   Deeann Saint, MD  mirtazapine (REMERON) 15 MG tablet Take 15 mg by mouth at bedtime. 06/24/21   [provider]  mirtazapine (REMERON) 45 MG tablet Take 0.5 tablets (22.5 mg total) by mouth at bedtime. For depression 03/02/15   Clapacs,  Jackquline Denmark, MD  Multiple Vitamins-Minerals (CENTRUM SILVER PO) Take by mouth daily.    [provider]  nystatin cream (MYCOSTATIN) Apply 1 Application topically 2 (two) times daily.    [provider]  PARoxetine (PAXIL) 40 MG tablet Take 1 tablet (40 mg total) by mouth at bedtime. For depression 09/28/14   Armandina Stammer I, NP  tamoxifen (NOLVADEX) 10 MG tablet Take 10 mg by mouth daily. 09/26/22   [provider]      Allergies    Cats claw Montez Hageman tomentosa (cats claw)]    Review of Systems   Review of Systems  Constitutional:  Negative for fever.  Respiratory:  Negative for shortness of breath.   Cardiovascular:  Positive for chest pain.    Physical Exam Updated Vital Signs BP 134/71 (BP Location: Right Arm)   Pulse (!) 50   Temp 98.7 F (37.1 C) (Oral)   Resp 18   Wt 99.8 kg   SpO2 97%   BMI 31.57 kg/m  Physical Exam Vitals and nursing note reviewed.  Constitutional:      General: He is not in acute distress.    Appearance: He is well-developed.  HENT:     Head: Normocephalic and atraumatic.  Eyes:     Conjunctiva/sclera: Conjunctivae normal.  Cardiovascular:     Rate and Rhythm: Normal rate and regular  rhythm.     Heart sounds: No murmur heard. Pulmonary:     Effort: Pulmonary effort is normal. No respiratory distress.     Breath sounds: Normal breath sounds.  Chest:     Comments: Tenderness to palpation of left chest wall/rib cage.  No evidence of ecchymosis, crepitus, wound or rash Abdominal:     Palpations: Abdomen is soft.     Tenderness: There is no abdominal tenderness.  Musculoskeletal:        General: No swelling.     Cervical back: Neck supple.  Skin:    General: Skin is warm and dry.     Capillary Refill: Capillary refill takes less than 2 seconds.     Comments: No rash on the left chest wall  Neurological:     Mental Status: He is alert.  Psychiatric:        Mood and Affect: Mood normal.     ED Results / Procedures  / Treatments   Labs (all labs ordered are listed, but only abnormal results are displayed) Labs Reviewed  CBC WITH DIFFERENTIAL/PLATELET - Abnormal; Notable for the following components:      Result Value   WBC 3.2 (*)    RBC 3.87 (*)    Hemoglobin 12.5 (*)    HCT 37.8 (*)    All other components within normal limits  COMPREHENSIVE METABOLIC PANEL - Abnormal; Notable for the following components:   Glucose, Bld 103 (*)    Creatinine, Ser 1.29 (*)    Calcium 8.6 (*)    Total Protein 6.4 (*)    GFR, Estimated 53 (*)    All other components within normal limits  TROPONIN I (HIGH SENSITIVITY)  TROPONIN I (HIGH SENSITIVITY)    EKG None  Radiology DG Ribs Unilateral W/Chest Left  Result Date: 09/20/2023 CLINICAL DATA:  Pain. EXAM: LEFT RIBS AND CHEST - 4 VIEW COMPARISON:  10/31/2022. FINDINGS: No fracture or other bone lesions are seen involving the ribs. There is no evidence of pneumothorax or pleural effusion. Both lungs are clear. Heart size and mediastinal contours are within normal limits. IMPRESSION: Negative. Electronically Signed   By: Layla Maw M.D.   On: 09/20/2023 11:17    Procedures Procedures   Medications Ordered in ED Medications  lidocaine (LIDODERM) 5 % 1 patch (1 patch Transdermal Patch Applied 09/20/23 1032)    ED Course/ Medical Decision Making/ A&P             HEART Score: 4                    Medical Decision Making Patient is an 89 year old male, here for left chest pain, that is been going on for the last few days.  He has tenderness to palpation of the chest wall, no evidence of any kind of wound, rash, or crepitus.  Will obtain chest x-ray, EKG, trops, given his age to rule out ACS.  Amount and/or Complexity of Data Reviewed Labs: ordered.    Details: Troponins within normal limits  Radiology: ordered.    Details: Chest x-ray clear ECG/medicine tests:     Details: Sinus rhythm Discussion of management or test interpretation with external  provider(s): Discussed with patient, I believe that he likely has a chest strain, his labs are reassuring, troponins are negative, and he is not tachypneic, or tachycardic.  Respirations are unlabored.  Denies any shortness of breath.  Will discharge, with lidocaine patch.  He states once lidocaine patch was placed, it  made his pain go away.  Likely represents a musculoskeletal strain  Risk OTC drugs. Prescription drug management.    Final Clinical Impression(s) / ED Diagnoses Final diagnoses:  Muscle strain of chest wall, initial encounter    Rx / DC Orders ED Discharge Orders          Ordered    lidocaine (HM LIDOCAINE PATCH) 4 %  Every 24 hours        09/20/23 1355              Jayona Mccaig L, PA 09/20/23 1412    Anders Simmonds T, DO 09/20/23 1820

## 2023-09-20 NOTE — ED Triage Notes (Signed)
Pt lifted a bag of books a few days ago, perhaps "too much for me."

## 2023-09-20 NOTE — ED Triage Notes (Signed)
Left rib pain (near axilla). Started 2 days ago.no recent falls

## 2023-09-20 NOTE — Discharge Instructions (Signed)
Please follow-up with your primary care doctor, I am suspicious that you have a chest wall strain, which is causing the pain.  Return if you have worsening chest pain, shortness of breath, nausea, or vomiting.  You can use Tylenol, and lidocaine patches for the pain.

## 2023-09-20 NOTE — ED Triage Notes (Signed)
Does not worsen with deep breath

## 2023-10-12 DIAGNOSIS — F251 Schizoaffective disorder, depressive type: Secondary | ICD-10-CM | POA: Diagnosis not present

## 2023-10-27 DIAGNOSIS — L820 Inflamed seborrheic keratosis: Secondary | ICD-10-CM | POA: Diagnosis not present

## 2023-10-27 DIAGNOSIS — L570 Actinic keratosis: Secondary | ICD-10-CM | POA: Diagnosis not present

## 2023-10-27 DIAGNOSIS — L821 Other seborrheic keratosis: Secondary | ICD-10-CM | POA: Diagnosis not present

## 2023-10-27 DIAGNOSIS — L814 Other melanin hyperpigmentation: Secondary | ICD-10-CM | POA: Diagnosis not present

## 2023-12-15 DIAGNOSIS — H401431 Capsular glaucoma with pseudoexfoliation of lens, bilateral, mild stage: Secondary | ICD-10-CM | POA: Diagnosis not present

## 2023-12-15 DIAGNOSIS — H2512 Age-related nuclear cataract, left eye: Secondary | ICD-10-CM | POA: Diagnosis not present

## 2023-12-28 ENCOUNTER — Other Ambulatory Visit: Payer: Self-pay | Admitting: Family Medicine

## 2023-12-28 DIAGNOSIS — E039 Hypothyroidism, unspecified: Secondary | ICD-10-CM

## 2024-01-05 DIAGNOSIS — K1321 Leukoplakia of oral mucosa, including tongue: Secondary | ICD-10-CM | POA: Diagnosis not present

## 2024-01-06 ENCOUNTER — Encounter (HOSPITAL_COMMUNITY)
Admission: AD | Disposition: A | Discharge status: Home / Self Care | Payer: Self-pay | Source: Ambulatory Visit | Attending: Otolaryngology

## 2024-01-06 ENCOUNTER — Observation Stay (HOSPITAL_COMMUNITY)
Admission: AD | Admit: 2024-01-06 | Discharge: 2024-01-07 | Disposition: A | Discharge status: Home / Self Care | Source: Ambulatory Visit | Attending: Otolaryngology | Admitting: Otolaryngology

## 2024-01-06 ENCOUNTER — Ambulatory Visit (HOSPITAL_BASED_OUTPATIENT_CLINIC_OR_DEPARTMENT_OTHER): Admitting: Anesthesiology

## 2024-01-06 ENCOUNTER — Encounter (HOSPITAL_COMMUNITY): Payer: Self-pay | Admitting: Otolaryngology

## 2024-01-06 ENCOUNTER — Other Ambulatory Visit: Payer: Self-pay

## 2024-01-06 ENCOUNTER — Ambulatory Visit (HOSPITAL_COMMUNITY): Admitting: Anesthesiology

## 2024-01-06 DIAGNOSIS — Z87891 Personal history of nicotine dependence: Secondary | ICD-10-CM | POA: Diagnosis not present

## 2024-01-06 DIAGNOSIS — K1321 Leukoplakia of oral mucosa, including tongue: Secondary | ICD-10-CM | POA: Diagnosis not present

## 2024-01-06 DIAGNOSIS — K1329 Other disturbances of oral epithelium, including tongue: Secondary | ICD-10-CM | POA: Diagnosis not present

## 2024-01-06 HISTORY — PX: EXCISION OF TONGUE LESION: SHX6434

## 2024-01-06 HISTORY — PX: CO2 LASER APPLICATION: SHX5778

## 2024-01-06 LAB — CBC
HCT: 43.6 % (ref 39.0–52.0)
Hemoglobin: 14.4 g/dL (ref 13.0–17.0)
MCH: 32.7 pg (ref 26.0–34.0)
MCHC: 33 g/dL (ref 30.0–36.0)
MCV: 99.1 fL (ref 80.0–100.0)
Platelets: 174 10*3/uL (ref 150–400)
RBC: 4.4 MIL/uL (ref 4.22–5.81)
RDW: 13.4 % (ref 11.5–15.5)
WBC: 4 10*3/uL (ref 4.0–10.5)
nRBC: 0 % (ref 0.0–0.2)

## 2024-01-06 LAB — BASIC METABOLIC PANEL
Anion gap: 11 (ref 5–15)
BUN: 16 mg/dL (ref 8–23)
CO2: 23 mmol/L (ref 22–32)
Calcium: 9.5 mg/dL (ref 8.9–10.3)
Chloride: 103 mmol/L (ref 98–111)
Creatinine, Ser: 1.1 mg/dL (ref 0.61–1.24)
GFR, Estimated: >60 mL/min (ref >60)
Glucose, Bld: 90 mg/dL (ref 70–99)
Potassium: 4 mmol/L (ref 3.5–5.1)
Sodium: 137 mmol/L (ref 135–145)

## 2024-01-06 SURGERY — EXCISION, LESION, TONGUE
Anesthesia: General | Site: Mouth | Laterality: Bilateral

## 2024-01-06 MED ORDER — CLONAZEPAM 0.5 MG PO TABS
0.5000 mg | ORAL_TABLET | Freq: Two times a day (BID) | ORAL | Status: DC | PRN
  Administered 2024-01-06: 0.5 mg via ORAL
  Filled 2024-01-06: qty 1

## 2024-01-06 MED ORDER — FENTANYL CITRATE (PF) 100 MCG/2ML IJ SOLN
INTRAMUSCULAR | Status: DC | PRN
  Administered 2024-01-06 (×2): 50 ug via INTRAVENOUS

## 2024-01-06 MED ORDER — TAMOXIFEN CITRATE 10 MG PO TABS: 10.0000 mg | ORAL_TABLET | Freq: Every day | ORAL | Status: DC

## 2024-01-06 MED ORDER — LIDOCAINE-EPINEPHRINE 1 %-1:100000 IJ SOLN: INTRAMUSCULAR | Status: DC | PRN

## 2024-01-06 MED ORDER — FENTANYL CITRATE (PF) 250 MCG/5ML IJ SOLN
INTRAMUSCULAR | Status: AC
Start: 2024-01-06 — End: ?
  Filled 2024-01-06: qty 5

## 2024-01-06 MED ORDER — LIDOCAINE 2% (20 MG/ML) 5 ML SYRINGE: INTRAMUSCULAR | Status: DC | PRN

## 2024-01-06 MED ORDER — ORAL CARE MOUTH RINSE: 15.0000 mL | Freq: Once | OROMUCOSAL | Status: AC

## 2024-01-06 MED ORDER — PAROXETINE HCL 20 MG PO TABS
40.0000 mg | ORAL_TABLET | Freq: Every day | ORAL | Status: DC
  Administered 2024-01-06: 40 mg via ORAL
  Filled 2024-01-06: qty 2

## 2024-01-06 MED ORDER — LACTATED RINGERS IV SOLN: INTRAVENOUS | Status: DC

## 2024-01-06 MED ORDER — FENTANYL CITRATE (PF) 100 MCG/2ML IJ SOLN: 25.0000 ug | INTRAMUSCULAR | Status: DC | PRN

## 2024-01-06 MED ORDER — ACETAMINOPHEN 160 MG/5ML PO SOLN: 650.0000 mg | ORAL | Status: DC | PRN

## 2024-01-06 MED ORDER — ADULT MULTIVITAMIN W/MINERALS CH
1.0000 | ORAL_TABLET | Freq: Every day | ORAL | Status: DC
  Administered 2024-01-07: 1 via ORAL
  Filled 2024-01-06: qty 1

## 2024-01-06 MED ORDER — ACETAMINOPHEN 650 MG RE SUPP: 650.0000 mg | RECTAL | Status: DC | PRN

## 2024-01-06 MED ORDER — ACETAMINOPHEN 500 MG PO TABS
1000.0000 mg | ORAL_TABLET | Freq: Once | ORAL | Status: AC
  Administered 2024-01-06: 1000 mg via ORAL
  Filled 2024-01-06: qty 2

## 2024-01-06 MED ORDER — 0.9 % SODIUM CHLORIDE (POUR BTL) OPTIME
TOPICAL | Status: DC | PRN
  Administered 2024-01-06: 1000 mL

## 2024-01-06 MED ORDER — ORAL CARE MOUTH RINSE: 15.0000 mL | OROMUCOSAL | Status: DC | PRN

## 2024-01-06 MED ORDER — EPINEPHRINE HCL (NASAL) 0.1 % NA SOLN
NASAL | Status: AC
  Filled 2024-01-06: qty 30

## 2024-01-06 MED ORDER — ONDANSETRON HCL 4 MG/2ML IJ SOLN: 4.0000 mg | Freq: Once | INTRAMUSCULAR | Status: DC | PRN

## 2024-01-06 MED ORDER — LIDOCAINE-EPINEPHRINE 1 %-1:100000 IJ SOLN
INTRAMUSCULAR | Status: AC
  Filled 2024-01-06: qty 1

## 2024-01-06 MED ORDER — LIDOCAINE HCL (CARDIAC) PF 100 MG/5ML IV SOSY
PREFILLED_SYRINGE | INTRAVENOUS | Status: DC | PRN
  Administered 2024-01-06: 60 mg via INTRAVENOUS

## 2024-01-06 MED ORDER — PROPOFOL 10 MG/ML IV BOLUS
INTRAVENOUS | Status: DC | PRN
  Administered 2024-01-06: 40 mg via INTRAVENOUS
  Administered 2024-01-06: 120 mg via INTRAVENOUS
  Administered 2024-01-06: 40 mg via INTRAVENOUS

## 2024-01-06 MED ORDER — DOCUSATE SODIUM 100 MG PO CAPS
100.0000 mg | ORAL_CAPSULE | Freq: Every day | ORAL | Status: DC
  Administered 2024-01-07: 100 mg via ORAL
  Filled 2024-01-06: qty 1

## 2024-01-06 MED ORDER — BUSPIRONE HCL 10 MG PO TABS
10.0000 mg | ORAL_TABLET | Freq: Every day | ORAL | Status: DC
  Administered 2024-01-06: 10 mg via ORAL
  Filled 2024-01-06: qty 1

## 2024-01-06 MED ORDER — LABETALOL HCL 5 MG/ML IV SOLN: 5.0000 mg | INTRAVENOUS | Status: DC | PRN

## 2024-01-06 MED ORDER — DEXTROSE-SODIUM CHLORIDE 5-0.9 % IV SOLN: INTRAVENOUS | Status: AC

## 2024-01-06 MED ORDER — MIRTAZAPINE 15 MG PO TABS
15.0000 mg | ORAL_TABLET | Freq: Every day | ORAL | Status: DC
  Administered 2024-01-06: 15 mg via ORAL
  Filled 2024-01-06: qty 1

## 2024-01-06 MED ORDER — LIDOCAINE 5 % EX PTCH
1.0000 | MEDICATED_PATCH | CUTANEOUS | Status: DC
  Filled 2024-01-06: qty 1

## 2024-01-06 MED ORDER — LEVOTHYROXINE SODIUM 50 MCG PO TABS
50.0000 ug | ORAL_TABLET | Freq: Every day | ORAL | Status: DC
  Administered 2024-01-07: 50 ug via ORAL
  Filled 2024-01-06: qty 1

## 2024-01-06 MED ORDER — SUGAMMADEX SODIUM 200 MG/2ML IV SOLN
INTRAVENOUS | Status: DC | PRN
  Administered 2024-01-06: 200 mg via INTRAVENOUS

## 2024-01-06 MED ORDER — CHLORHEXIDINE GLUCONATE 0.12 % MT SOLN: 15.0000 mL | Freq: Once | OROMUCOSAL | Status: AC

## 2024-01-06 MED ORDER — CHLORHEXIDINE GLUCONATE 0.12 % MT SOLN
OROMUCOSAL | Status: AC
  Administered 2024-01-06: 15 mL via OROMUCOSAL
  Filled 2024-01-06: qty 15

## 2024-01-06 MED ORDER — GLYCOPYRROLATE 0.2 MG/ML IJ SOLN
INTRAMUSCULAR | Status: DC | PRN
  Administered 2024-01-06: .2 mg via INTRAVENOUS

## 2024-01-06 MED ORDER — IBUPROFEN 100 MG/5ML PO SUSP
400.0000 mg | Freq: Four times a day (QID) | ORAL | Status: DC
  Filled 2024-01-06 (×3): qty 20

## 2024-01-06 MED ORDER — ORAL CARE MOUTH RINSE
15.0000 mL | OROMUCOSAL | Status: DC
  Administered 2024-01-07: 15 mL via OROMUCOSAL

## 2024-01-06 MED ORDER — NYSTATIN 100000 UNIT/GM EX CREA
1.0000 | TOPICAL_CREAM | Freq: Two times a day (BID) | CUTANEOUS | Status: DC
  Filled 2024-01-06: qty 30

## 2024-01-06 MED ORDER — MIRTAZAPINE 15 MG PO TABS: 15.0000 mg | ORAL_TABLET | Freq: Every day | ORAL | Status: DC

## 2024-01-06 MED ORDER — DORZOLAMIDE HCL-TIMOLOL MAL 2-0.5 % OP SOLN
1.0000 [drp] | Freq: Two times a day (BID) | OPHTHALMIC | Status: DC
  Administered 2024-01-06 – 2024-01-07 (×2): 1 [drp] via OPHTHALMIC
  Filled 2024-01-06: qty 10

## 2024-01-06 MED ORDER — SUCCINYLCHOLINE CHLORIDE 200 MG/10ML IV SOSY
PREFILLED_SYRINGE | INTRAVENOUS | Status: DC | PRN
  Administered 2024-01-06: 140 mg via INTRAVENOUS

## 2024-01-06 MED ORDER — ROCURONIUM BROMIDE 100 MG/10ML IV SOLN
INTRAVENOUS | Status: DC | PRN
  Administered 2024-01-06: 10 mg via INTRAVENOUS
  Administered 2024-01-06: 20 mg via INTRAVENOUS

## 2024-01-06 SURGICAL SUPPLY — 14 items
BLADE SURG 15 STRL LF DISP TIS (BLADE) IMPLANT
CANISTER SUCT 3000ML PPV (MISCELLANEOUS) ×1 IMPLANT
COVER BACK TABLE 60X90IN (DRAPES) ×1 IMPLANT
GLOVE ECLIPSE 7.5 STRL STRAW (GLOVE) ×1 IMPLANT
KIT BASIN OR (CUSTOM PROCEDURE TRAY) ×1 IMPLANT
KIT TURNOVER KIT B (KITS) ×1 IMPLANT
NDL PRECISIONGLIDE 27X1.5 (NEEDLE) IMPLANT
NEEDLE PRECISIONGLIDE 27X1.5 (NEEDLE) ×1 IMPLANT
PAD ARMBOARD POSITIONER FOAM (MISCELLANEOUS) ×2 IMPLANT
SUT CHROMIC 3 0 SH 27 (SUTURE) IMPLANT
SYR CONTROL 10ML LL (SYRINGE) IMPLANT
TOWEL GREEN STERILE FF (TOWEL DISPOSABLE) ×1 IMPLANT
TUBE CONNECTING 12X1/4 (SUCTIONS) ×1 IMPLANT
WATER STERILE IRR 1000ML POUR (IV SOLUTION) ×1 IMPLANT

## 2024-01-06 NOTE — H&P (Signed)
 Gary Osborn is an 89 y.o. male.   Chief Complaint: Oral leukoplakia HPI: History of chronic oral leukoplakia.  Past Medical History:  Diagnosis Date   ALLERGIC RHINITIS 10/05/2007   ANXIETY 10/05/2007   COLONIC POLYPS, HX OF 10/05/2007   DEPRESSION 10/04/2008   PROSTATE CANCER, UNSPEC. 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    Past Surgical History:  Procedure Laterality Date   NASAL SINUS SURGERY     PROSTATE SURGERY     singulotomy     for OCD   TONSILLECTOMY      Family History  Problem Relation Age of Onset   Prostate cancer Father    Breast cancer Sister    Social History:  reports that he quit smoking about 50 years ago. His smoking use included cigarettes. He has never used smokeless tobacco. He reports current alcohol use of about 28.0 standard drinks of alcohol per week. He reports that he does not use drugs.  Allergies:  Allergies  Allergen Reactions   Cats Claw [Uncaria Tomentosa (Cats Claw)]     PT IS ALLERGIC TO CATS/ DOES NOT HAVE ANY    No medications prior to admission.    No results found for this or any previous visit (from the past 48 hours). No results found.  ROS: otherwise negative  There were no vitals taken for this visit.  PHYSICAL EXAM: Overall appearance:  Healthy appearing, in no distress Head:  Normocephalic, atraumatic. Ears: External auditory canals are clear; tympanic membranes are intact and the middle ears are free of any effusion. Nose: External nose is healthy in appearance. Internal nasal exam free of any lesions or obstruction. Oral Cavity/pharynx: Small patch of thin leukoplakia right posterior lateral oral tongue.  Larger patch with some tenderness and slight ulceration on the left side also posterolateral tongue.  There are no other mucosal lesions or masses identified. Hypopharynx/Larynx: no signs of any mucosal lesions or masses identified. Vocal cords move normally. Neuro:  No identifiable neurologic  deficits. Neck: No palpable neck masses.  Studies Reviewed: none    Assessment/Plan Oral leukoplakia bilateral posterior lateral tongue, worse on the left.  Recommend biopsy and carbon oxide laser ablation of both sides.  Serena Colonel 01/06/2024, 11:53 AM

## 2024-01-06 NOTE — Op Note (Signed)
 OPERATIVE REPORT  DATE OF SURGERY: 01/06/2024  PATIENT:  Libby Maw,  89 y.o. male  PRE-OPERATIVE DIAGNOSIS:  Oral leukoplakia  POST-OPERATIVE DIAGNOSIS:  Oral leukoplakia  PROCEDURE:  Procedure(s): EXCISION, LESION, TONGUE CO2 LASER ablation of the posterior tongue lesion  SURGEON:  Susy Frizzle, MD  ASSISTANTS: None  ANESTHESIA:   General   EBL: 10 ml  DRAINS: None  LOCAL MEDICATIONS USED:  None  SPECIMEN: Left posterior oral tongue biopsy  COUNTS:  Correct  PROCEDURE DETAILS: The patient was taken to the operating room and placed on the operating table in the supine position. Following induction of general endotracheal anesthesia, the face was prepped with saline soaked towels and iPads for laser protection.  A mandible retractor was used.  A towel clip was used on the tip of the tongue for protraction.  Posterior tongue was inspected bilaterally.  A biopsy forceps was used to take biopsy of the left posterior lateral tongue lesion.  This was sent for pathologic evaluation.  The carbon oxide laser with a handpiece was used at superpulse 3 W power to ablate the lesion on both sides.  There was minimal bleeding.  The towel clip was removed from the tongue and a 3-0 chromic suture was used with 2 interrupted inverted sutures on the ventral surface of the tongue for closure of the mucosa.  Patient was awakened extubated and transferred to recovery in stable condition.    PATIENT DISPOSITION:  To PACU, stable

## 2024-01-06 NOTE — Discharge Instructions (Signed)
 Resume diet as tolerated.  Okay to use Tylenol/Motrin as needed.

## 2024-01-06 NOTE — Anesthesia Preprocedure Evaluation (Addendum)
 Anesthesia Evaluation  Patient identified by MRN, date of birth, ID band Patient awake    Reviewed: Allergy & Precautions, NPO status , Patient's Chart, lab work & pertinent test results  Airway Mallampati: IV  TM Distance: >3 FB Neck ROM: Full  Mouth opening: Limited Mouth Opening  Dental  (+) Teeth Intact, Dental Advisory Given   Pulmonary former smoker   breath sounds clear to auscultation       Cardiovascular negative cardio ROS  Rhythm:Regular Rate:Normal     Neuro/Psych  PSYCHIATRIC DISORDERS Anxiety Depression  Schizophrenia  negative neurological ROS     GI/Hepatic negative GI ROS,,,(+)       alcohol use  Endo/Other  Hypothyroidism    Renal/GU negative Renal ROS  negative genitourinary   Musculoskeletal negative musculoskeletal ROS (+)    Abdominal   Peds  Hematology negative hematology ROS (+)   Anesthesia Other Findings   Reproductive/Obstetrics negative OB ROS                             Anesthesia Physical Anesthesia Plan  ASA: 2  Anesthesia Plan: General   Post-op Pain Management: Tylenol PO (pre-op)*   Induction: Intravenous  PONV Risk Score and Plan: 2 and Ondansetron, Dexamethasone and Treatment may vary due to age or medical condition  Airway Management Planned: Oral ETT and Video Laryngoscope Planned  Additional Equipment: None  Intra-op Plan:   Post-operative Plan: Extubation in OR  Informed Consent: I have reviewed the patients History and Physical, chart, labs and discussed the procedure including the risks, benefits and alternatives for the proposed anesthesia with the patient or authorized representative who has indicated his/her understanding and acceptance.     Dental advisory given  Plan Discussed with: CRNA  Anesthesia Plan Comments:         Anesthesia Quick Evaluation

## 2024-01-06 NOTE — Anesthesia Procedure Notes (Cosign Needed Addendum)
 Procedure Name: Intubation Date/Time: 01/06/2024 3:13 PM  Performed by: Beryle Lathe, MDPre-anesthesia Checklist: Patient identified, Emergency Drugs available, Suction available and Patient being monitored Patient Re-evaluated:Patient Re-evaluated prior to induction Oxygen Delivery Method: Circle system utilized Preoxygenation: Pre-oxygenation with 100% oxygen Induction Type: IV induction Ventilation: Mask ventilation without difficulty Laryngoscope Size: Glidescope and 3 Grade View: Grade I Tube size: 7.5 mm Number of attempts: 1 Airway Equipment and Method: Stylet and Video-laryngoscopy Placement Confirmation: ETT inserted through vocal cords under direct vision, positive ETCO2 and breath sounds checked- equal and bilateral Secured at: 23 cm Tube secured with: Tape Dental Injury: Teeth and Oropharynx as per pre-operative assessment

## 2024-01-06 NOTE — Transfer of Care (Cosign Needed)
 Immediate Anesthesia Transfer of Care Note  Patient: Gary Osborn  Procedure(s) Performed: EXCISION, LESION, TONGUE (Bilateral) CO2 LASER APPLICATION (Bilateral)  Patient Location: PACU  Anesthesia Type:General  Level of Consciousness: drowsy and patient cooperative  Airway & Oxygen Therapy: Patient Spontanous Breathing and Patient connected to face mask oxygen  Post-op Assessment: Report given to RN and Post -op Vital signs reviewed and stable  Post vital signs: Reviewed and stable  Last Vitals:  Vitals Value Taken Time  BP 160/94 01/06/24 1602  Temp    Pulse 88 01/06/24 1605  Resp 21 01/06/24 1605  SpO2 88 % 01/06/24 1605  Vitals shown include unfiled device data.  Last Pain:  Vitals:   01/06/24 1354  TempSrc:   PainSc: 0-No pain         Complications: There were no known notable events for this encounter.

## 2024-01-06 NOTE — Progress Notes (Signed)
 Updated friend patrick.

## 2024-01-06 NOTE — Progress Notes (Signed)
 Report received from Gentry Roch, RN

## 2024-01-07 ENCOUNTER — Encounter (HOSPITAL_COMMUNITY): Payer: Self-pay | Admitting: Otolaryngology

## 2024-01-07 DIAGNOSIS — K1321 Leukoplakia of oral mucosa, including tongue: Secondary | ICD-10-CM | POA: Diagnosis not present

## 2024-01-07 DIAGNOSIS — Z87891 Personal history of nicotine dependence: Secondary | ICD-10-CM | POA: Diagnosis not present

## 2024-01-07 NOTE — Anesthesia Postprocedure Evaluation (Signed)
 Anesthesia Post Note  Patient: Gary Osborn  Procedure(s) Performed: EXCISION, LESION, TONGUE (Bilateral: Mouth) CO2 LASER APPLICATION (Bilateral: Mouth)     Patient location during evaluation: PACU Anesthesia Type: General Level of consciousness: awake and alert Pain management: pain level controlled Vital Signs Assessment: post-procedure vital signs reviewed and stable Respiratory status: spontaneous breathing, nonlabored ventilation and respiratory function stable Cardiovascular status: blood pressure returned to baseline and stable Postop Assessment: no apparent nausea or vomiting Anesthetic complications: no   There were no known notable events for this encounter.  Last Vitals:  Vitals:   01/07/24 0505 01/07/24 0723  BP: (!) 153/80 (!) 142/79  Pulse: (!) 51 (!) 55  Resp: 16 18  Temp: 36.7 C 36.8 C  SpO2: 96% 98%    Last Pain:  Vitals:   01/07/24 0723  TempSrc: Oral  PainSc:                  Beryle Lathe

## 2024-01-07 NOTE — Plan of Care (Signed)
 Discharge instructions discussed with patient.  Patient instructed on home medications, restrictions, and follow up appointments. Belongings gathered and sent with patient.  No new patient medications

## 2024-01-07 NOTE — Discharge Summary (Signed)
 Physician Discharge Summary  Patient ID: Gary Osborn MRN: [id number] DOB/AGE: Dec 01, 1932 89 y.o.  Admit date: 01/06/2024 Discharge date: 01/07/2024  Admission Diagnoses: Oral leukoplakia  Discharge Diagnoses:  Principal Problem:   Oral leukoplakia   Discharged Condition: good  Hospital Course: No complications, kept overnight due to not having any family members at home.  Consults: none  Significant Diagnostic Studies: none  Treatments: surgery: Oral biopsy and CO2 laser ablation.  Discharge Exam: Blood pressure (!) 142/79, pulse (!) 55, temperature 98.2 F (36.8 C), temperature source Oral, resp. rate 18, height 5\' 10"  (1.778 m), weight 95.3 kg, SpO2 98%. PHYSICAL EXAM: Awake and alert.  Minimal pain.  Ecchymosis of the ventral surface tip of tongue.  Laser sites are healing appropriately.  Disposition: Discharge disposition: 01-Home or Self Care       Discharge Instructions     Diet - low sodium heart healthy   Complete by: As directed    Increase activity slowly   Complete by: As directed    Increase activity slowly   Complete by: As directed       Allergies as of 01/07/2024       Reactions   Cats Claw [uncaria Tomentosa (cats Claw)]    PT IS ALLERGIC TO CATS/ DOES NOT HAVE ANY        Medication List     TAKE these medications    busPIRone 10 MG tablet Commonly known as: BUSPAR Take 10 mg by mouth at bedtime. Take 2 tabs twice daily   CENTRUM SILVER PO Take 1 tablet by mouth daily.   clonazePAM 0.5 MG tablet Commonly known as: KLONOPIN Take 1 tablet (0.5 mg total) by mouth 2 (two) times daily as needed. For severe anxiety What changed:  when to take this additional instructions   docusate sodium 100 MG capsule Commonly known as: COLACE Take 100 mg by mouth daily.   dorzolamide-timolol 2-0.5 % ophthalmic solution Commonly known as: COSOPT Place 1 drop into both eyes 2 (two) times daily.   levothyroxine 50 MCG tablet Commonly known  as: SYNTHROID Take 1 tablet (50 mcg total) by mouth daily before breakfast.   lidocaine 4 % Commonly known as: HM Lidocaine Patch Place 1 patch onto the skin daily.   mirtazapine 45 MG tablet Commonly known as: REMERON Take 0.5 tablets (22.5 mg total) by mouth at bedtime. For depression   mirtazapine 15 MG tablet Commonly known as: REMERON Take 15 mg by mouth at bedtime.   nystatin cream Commonly known as: MYCOSTATIN Apply 1 Application topically 2 (two) times daily.   PARoxetine 40 MG tablet Commonly known as: PAXIL Take 1 tablet (40 mg total) by mouth at bedtime. For depression         Signed: Serena Colonel 01/07/2024, 8:48 AM

## 2024-01-08 LAB — SURGICAL PATHOLOGY

## 2024-01-19 DIAGNOSIS — K1321 Leukoplakia of oral mucosa, including tongue: Secondary | ICD-10-CM | POA: Diagnosis not present

## 2024-03-10 DIAGNOSIS — F251 Schizoaffective disorder, depressive type: Secondary | ICD-10-CM | POA: Diagnosis not present

## 2024-03-21 DIAGNOSIS — H401431 Capsular glaucoma with pseudoexfoliation of lens, bilateral, mild stage: Secondary | ICD-10-CM | POA: Diagnosis not present

## 2024-03-21 DIAGNOSIS — H2512 Age-related nuclear cataract, left eye: Secondary | ICD-10-CM | POA: Diagnosis not present

## 2024-03-22 DIAGNOSIS — Z8546 Personal history of malignant neoplasm of prostate: Secondary | ICD-10-CM | POA: Diagnosis not present

## 2024-03-22 DIAGNOSIS — E291 Testicular hypofunction: Secondary | ICD-10-CM | POA: Diagnosis not present

## 2024-03-28 DIAGNOSIS — Z8546 Personal history of malignant neoplasm of prostate: Secondary | ICD-10-CM | POA: Diagnosis not present

## 2024-03-28 DIAGNOSIS — E291 Testicular hypofunction: Secondary | ICD-10-CM | POA: Diagnosis not present

## 2024-03-28 DIAGNOSIS — N481 Balanitis: Secondary | ICD-10-CM | POA: Diagnosis not present

## 2024-03-28 DIAGNOSIS — N393 Stress incontinence (female) (male): Secondary | ICD-10-CM | POA: Diagnosis not present

## 2024-03-28 DIAGNOSIS — N471 Phimosis: Secondary | ICD-10-CM | POA: Diagnosis not present

## 2024-04-19 DIAGNOSIS — K1321 Leukoplakia of oral mucosa, including tongue: Secondary | ICD-10-CM | POA: Diagnosis not present

## 2024-05-24 DIAGNOSIS — L814 Other melanin hyperpigmentation: Secondary | ICD-10-CM | POA: Diagnosis not present

## 2024-05-24 DIAGNOSIS — D1801 Hemangioma of skin and subcutaneous tissue: Secondary | ICD-10-CM | POA: Diagnosis not present

## 2024-05-24 DIAGNOSIS — L821 Other seborrheic keratosis: Secondary | ICD-10-CM | POA: Diagnosis not present

## 2024-05-24 DIAGNOSIS — L820 Inflamed seborrheic keratosis: Secondary | ICD-10-CM | POA: Diagnosis not present

## 2024-06-15 ENCOUNTER — Encounter: Payer: Self-pay | Admitting: Family Medicine

## 2024-06-15 ENCOUNTER — Ambulatory Visit: Payer: Medicare Other | Admitting: Family Medicine

## 2024-06-15 VITALS — BP 136/84 | HR 60 | Temp 98.1°F | Ht 70.0 in | Wt 220.4 lb

## 2024-06-15 DIAGNOSIS — Z8546 Personal history of malignant neoplasm of prostate: Secondary | ICD-10-CM | POA: Diagnosis not present

## 2024-06-15 DIAGNOSIS — Z0000 Encounter for general adult medical examination without abnormal findings: Secondary | ICD-10-CM | POA: Diagnosis not present

## 2024-06-15 DIAGNOSIS — F333 Major depressive disorder, recurrent, severe with psychotic symptoms: Secondary | ICD-10-CM

## 2024-06-15 DIAGNOSIS — Z6831 Body mass index (BMI) 31.0-31.9, adult: Secondary | ICD-10-CM

## 2024-06-15 DIAGNOSIS — Z23 Encounter for immunization: Secondary | ICD-10-CM

## 2024-06-15 DIAGNOSIS — E66811 Obesity, class 1: Secondary | ICD-10-CM

## 2024-06-15 DIAGNOSIS — E661 Drug-induced obesity: Secondary | ICD-10-CM | POA: Diagnosis not present

## 2024-06-15 DIAGNOSIS — E039 Hypothyroidism, unspecified: Secondary | ICD-10-CM

## 2024-06-15 DIAGNOSIS — Z8659 Personal history of other mental and behavioral disorders: Secondary | ICD-10-CM

## 2024-06-15 DIAGNOSIS — E291 Testicular hypofunction: Secondary | ICD-10-CM | POA: Diagnosis not present

## 2024-06-15 LAB — CBC WITH DIFFERENTIAL/PLATELET
Basophils Absolute: 0 K/uL (ref 0.0–0.1)
Basophils Relative: 0.9 % (ref 0.0–3.0)
Eosinophils Absolute: 0.1 K/uL (ref 0.0–0.7)
Eosinophils Relative: 3.4 % (ref 0.0–5.0)
HCT: 42.3 % (ref 39.0–52.0)
Hemoglobin: 14 g/dL (ref 13.0–17.0)
Lymphocytes Relative: 28.8 % (ref 12.0–46.0)
Lymphs Abs: 1 K/uL (ref 0.7–4.0)
MCHC: 33 g/dL (ref 30.0–36.0)
MCV: 97.6 fl (ref 78.0–100.0)
Monocytes Absolute: 0.4 K/uL (ref 0.1–1.0)
Monocytes Relative: 12.3 % — ABNORMAL HIGH (ref 3.0–12.0)
Neutro Abs: 1.9 K/uL (ref 1.4–7.7)
Neutrophils Relative %: 54.6 % (ref 43.0–77.0)
Platelets: 199 K/uL (ref 150.0–400.0)
RBC: 4.34 Mil/uL (ref 4.22–5.81)
RDW: 13 % (ref 11.5–15.5)
WBC: 3.5 K/uL — ABNORMAL LOW (ref 4.0–10.5)

## 2024-06-15 LAB — COMPREHENSIVE METABOLIC PANEL WITH GFR
ALT: 23 U/L (ref 0–53)
AST: 19 U/L (ref 0–37)
Albumin: 4.3 g/dL (ref 3.5–5.2)
Alkaline Phosphatase: 82 U/L (ref 39–117)
BUN: 22 mg/dL (ref 6–23)
CO2: 30 meq/L (ref 19–32)
Calcium: 9.2 mg/dL (ref 8.4–10.5)
Chloride: 104 meq/L (ref 96–112)
Creatinine, Ser: 1 mg/dL (ref 0.40–1.50)
GFR: 66.74 mL/min (ref >60.00)
Glucose, Bld: 93 mg/dL (ref 70–99)
Potassium: 5.3 meq/L — ABNORMAL HIGH (ref 3.5–5.1)
Sodium: 141 meq/L (ref 135–145)
Total Bilirubin: 0.7 mg/dL (ref 0.2–1.2)
Total Protein: 6.8 g/dL (ref 6.0–8.3)

## 2024-06-15 LAB — TSH: TSH: 3.51 u[IU]/mL (ref 0.35–5.50)

## 2024-06-15 LAB — LIPID PANEL
Cholesterol: 140 mg/dL (ref 0–200)
HDL: 49.1 mg/dL (ref >39.00)
LDL Cholesterol: 74 mg/dL (ref 0–99)
NonHDL: 90.43
Total CHOL/HDL Ratio: 3
Triglycerides: 81 mg/dL (ref 0.0–149.0)
VLDL: 16.2 mg/dL (ref 0.0–40.0)

## 2024-06-15 NOTE — Progress Notes (Signed)
 Subjective:    Gary Osborn is a 89 y.o. male who presents for Medicare Annual/Subsequent preventive examination.   Pt doing well overall.  Mood good.  Followed by [facility].  Seen by Urology for h/o Prostate Ca.  Still having some irritation and rash on penis and urinary incontinence.  Followed by Optho for glaucoma.  Preventive Screening-Counseling & Management  Tobacco Social History   Tobacco Use  Smoking Status Former   Current packs/day: 0.00   Types: Cigarettes   Quit date: 10/13/1973   Years since quitting: 50.7  Smokeless Tobacco Never    Problems Prior to Visit 1.   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   Schizoaffective disorder, depressive type (HCC) 06/05/2021   Gynecomastia 07/15/2020   History of prostate cancer 07/15/2020   Primary male hypogonadism 07/15/2020   Epistaxis 11/11/2016   Severe recurrent major depression with psychotic features (HCC)    MDD (major depressive disorder), recurrent severe, without psychosis (HCC) 09/14/2014   H/O suicide attempt 09/08/2014   Unsuccessful suicide attempt (HCC) 09/08/2014   Severe major depression without psychotic features (HCC) 09/08/2014   Major depressive disorder, recurrent, severe without psychotic features (HCC)    Suicidal ideation    MDD (major depressive disorder), recurrent, severe, with psychosis (HCC) 01/05/2014   Heart palpitations 06/30/2012   Brown hairy tongue 06/30/2012   SINUS BRADYCARDIA 10/04/2008   PROSTATE CANCER, UNSPEC. 10/05/2007   Anxiety state 10/05/2007   Allergic rhinitis 10/05/2007   History of colonic polyps 10/05/2007    Medications Prior to Visit Current Outpatient Medications on File Prior to Visit  Medication Sig Dispense Refill   busPIRone  (BUSPAR ) 10 MG tablet Take 10 mg by mouth at bedtime. Take 2 tabs twice daily     clonazePAM  (KLONOPIN ) 0.5 MG tablet Take 1 tablet (0.5 mg total) by mouth 2 (two) times  daily as needed. For severe anxiety (Patient taking differently: Take 0.5 mg by mouth at bedtime.) 8 tablet 0   docusate sodium  (COLACE) 100 MG capsule Take 100 mg by mouth daily.     dorzolamide -timolol  (COSOPT ) 22.3-6.8 MG/ML ophthalmic solution Place 1 drop into both eyes 2 (two) times daily.     levothyroxine  (SYNTHROID ) 50 MCG tablet Take 1 tablet (50 mcg total) by mouth daily before breakfast. 90 tablet 3   lidocaine  (HM LIDOCAINE  PATCH) 4 % Place 1 patch onto the skin daily. 30 patch 0   mirtazapine  (REMERON ) 15 MG tablet Take 15 mg by mouth at bedtime.     mirtazapine  (REMERON ) 45 MG tablet Take 0.5 tablets (22.5 mg total) by mouth at bedtime. For depression 30 tablet 0   Multiple Vitamins-Minerals (CENTRUM SILVER PO) Take 1 tablet by mouth daily.     nystatin  cream (MYCOSTATIN ) Apply 1 Application topically 2 (two) times daily.     PARoxetine  (PAXIL ) 40 MG tablet Take 1 tablet (40 mg total) by mouth at bedtime. For depression 30 tablet 0   No current facility-administered medications on file prior to visit.    Current Medications (verified) Current Outpatient Medications  Medication Sig Dispense Refill   busPIRone  (BUSPAR ) 10 MG tablet Take 10 mg by mouth at bedtime. Take 2 tabs twice daily     clonazePAM  (KLONOPIN ) 0.5 MG tablet Take 1 tablet (0.5 mg total) by mouth 2 (two) times daily as needed. For severe anxiety (Patient taking differently: Take 0.5 mg by mouth at bedtime.) 8 tablet 0   docusate sodium  (  COLACE) 100 MG capsule Take 100 mg by mouth daily.     dorzolamide -timolol  (COSOPT ) 22.3-6.8 MG/ML ophthalmic solution Place 1 drop into both eyes 2 (two) times daily.     levothyroxine  (SYNTHROID ) 50 MCG tablet Take 1 tablet (50 mcg total) by mouth daily before breakfast. 90 tablet 3   lidocaine  (HM LIDOCAINE  PATCH) 4 % Place 1 patch onto the skin daily. 30 patch 0   mirtazapine  (REMERON ) 15 MG tablet Take 15 mg by mouth at bedtime.     mirtazapine  (REMERON ) 45 MG tablet Take 0.5  tablets (22.5 mg total) by mouth at bedtime. For depression 30 tablet 0   Multiple Vitamins-Minerals (CENTRUM SILVER PO) Take 1 tablet by mouth daily.     nystatin  cream (MYCOSTATIN ) Apply 1 Application topically 2 (two) times daily.     PARoxetine  (PAXIL ) 40 MG tablet Take 1 tablet (40 mg total) by mouth at bedtime. For depression 30 tablet 0   No current facility-administered medications for this visit.     Allergies (verified) Cats claw [uncaria tomentosa (cats claw)]   PAST HISTORY  Family History Family History  Problem Relation Age of Onset   Prostate cancer Father    Breast cancer Sister     Social History Social History   Tobacco Use   Smoking status: Former    Current packs/day: 0.00    Types: Cigarettes    Quit date: 10/13/1973    Years since quitting: 50.7   Smokeless tobacco: Never  Substance Use Topics   Alcohol use: Yes    Alcohol/week: 28.0 standard drinks of alcohol    Types: 14 Cans of beer, 14 Standard drinks or equivalent per week    Comment: glass of chardonnay each night    Are there smokers in your home (other than you)?  No  Risk Factors Current exercise habits: Home exercise routine includes walking.  Dietary issues discussed: balanced diet   Cardiac risk factors: advanced age (older than 60 for men, 53 for women), male gender, and obesity (BMI >= 30 kg/m2).  Depression Screen (Note: if answer to either of the following is Yes, a more complete depression screening is indicated)   Q1: Over the past two weeks, have you felt down, depressed or hopeless? No  Q2: Over the past two weeks, have you felt little interest or pleasure in doing things? No  Have you lost interest or pleasure in daily life? No  Do you often feel hopeless? No  Do you cry easily over simple problems? No  Activities of Daily Living In your present state of health, do you have any difficulty performing the following activities?:  Driving? No Managing money?  No Feeding  yourself? No Getting from bed to chair? No Climbing a flight of stairs? No Preparing food and eating?: No Bathing or showering? No Getting dressed: No Getting to the toilet? No Using the toilet:No Moving around from place to place: No In the past year have you fallen or had a near fall?:Yes   Are you sexually active?  No  Do you have more than one partner?  No  Hearing Difficulties: No Do you often ask people to speak up or repeat themselves? No Do you experience ringing or noises in your ears? No Do you have difficulty understanding soft or whispered voices? No   Do you feel that you have a problem with memory? No  Do you often misplace items? No  Do you feel safe at home?  Yes  Cognitive Testing  Alert? Yes  Normal Appearance?Yes  Oriented to person? Yes  Place? Yes   Time? Yes  Recall of three objects?  Yes  Can perform simple calculations? Yes  Displays appropriate judgment?Yes  Can read the correct time from a watch face?Yes   Advanced Directives have been discussed with the patient? Yes   List the Names of Other Physician/Practitioners you currently use: 1.  Elna Lo, MD Psychiatry 2.  Norleen Seltzer, MD Urology 3.  Adine Haddock, MD Ophthalmology 4.  Ida Loader, MD ENT 5.  Dermatology  Indicate any recent Medical Services you may have received from other than [facility] providers in the past year (date may be approximate).  Immunization History  Administered Date(s) Administered    sv, Bivalent, Protein Subunit Rsvpref,pf (Abrysvo) 09/18/2022   Fluad Quad(high Dose 65+) 07/09/2019   Fluad Trivalent(High Dose 65+) 08/12/2023   INFLUENZA, HIGH DOSE SEASONAL PF 07/06/2015, 06/30/2018, 07/16/2020   Influenza Split 08/06/2011, 07/08/2012   Influenza Whole 07/29/2007, 07/26/2008, 07/10/2009, 06/27/2010   Influenza,inj,Quad PF,6+ Mos 06/22/2013, 06/29/2014   Influenza-Unspecified 07/04/2016, 07/22/2017, 07/25/2021   Moderna Covid-19 Fall Seasonal Vaccine 47yrs &  older 08/14/2022, 07/29/2023   Moderna Sars-Covid-2 Vaccination 10/17/2019, 11/14/2019, 08/21/2020, 03/12/2021   Pfizer Covid-19 Vaccine Bivalent Booster 31yrs & up 07/02/2021   Pneumococcal Conjugate-13 03/30/2015   Pneumococcal Polysaccharide-23 07/14/2003   Td 10/19/2009   Tdap 06/15/2024   Zoster Recombinant(Shingrix) 12/10/2021, 02/12/2022   Zoster, Live 12/13/2008    Screening Tests Health Maintenance  Topic Date Due   COVID-19 Vaccine (8 - Moderna risk 2024-25 season) 06/30/2024 (Originally 06/13/2024)   Influenza Vaccine  01/10/2025 (Originally 05/13/2024)   Medicare Annual Wellness (AWV)  06/15/2025   DTaP/Tdap/Td (3 - Td or Tdap) 06/15/2034   Pneumococcal Vaccine: 50+ Years  Completed   Zoster Vaccines- Shingrix  Completed   HPV VACCINES  Aged Out   Meningococcal B Vaccine  Aged Out    All answers were reviewed with the patient and necessary referrals were made:  Clotilda JONELLE Single, MD   06/27/2024   History reviewed: allergies, current medications, past family history, past medical history, past social history, past surgical history, and problem list  Review of Systems Pertinent items noted in HPI and remainder of comprehensive ROS otherwise negative.    Objective:     Vision by Snellen chart: right eye:20/50, left eye:20/50 Blood pressure 136/84, pulse 60, temperature 98.1 F (36.7 C), temperature source Oral, height 5' 10 (1.778 m), weight 220 lb 6.4 oz (100 kg), SpO2 97%. Body mass index is 31.62 kg/m. Hearing Screening   500Hz  1000Hz  2000Hz  4000Hz   Right ear Fail Pass Fail Fail  Left ear Fail Fail Fail Fail   Vision Screening   Right eye Left eye Both eyes  Without correction     With correction 20/50 20/50 20/40     BP 136/84 (BP Location: Left Arm, Patient Position: Sitting, Cuff Size: Normal)   Pulse 60   Temp 98.1 F (36.7 C) (Oral)   Ht 5' 10 (1.778 m)   Wt 220 lb 6.4 oz (100 kg)   SpO2 97%   BMI 31.62 kg/m   General Appearance:    Alert,  cooperative, no distress, appears stated age  Head:    Normocephalic, without obvious abnormality, atraumatic  Eyes:    PERRL, conjunctiva/corneas clear, EOM's intact, fundi    benign, both eyes       Ears:    Normal TM's and external ear canals, both ears  Nose:   Nares normal, septum  midline, mucosa normal, no drainage    or sinus tenderness  Throat:   Lips, mucosa, and tongue normal; teeth and gums normal  Neck:   Supple, symmetrical, trachea midline, no adenopathy;       thyroid :  No enlargement/tenderness/nodules; no carotid   bruit or JVD  Back:     Symmetric, no curvature, ROM normal, no CVA tenderness  Lungs:     Clear to auscultation bilaterally, respirations unlabored  Chest wall:    No tenderness or deformity  Heart:    Regular rate and rhythm, S1 and S2 normal, no murmur, rub   or gallop  Abdomen:     Soft, non-tender, bowel sounds active all four quadrants,    no masses, no organomegaly  Genitalia:    Normal male without lesion, discharge or tenderness  Rectal:    Normal tone, normal prostate, no masses or tenderness;   guaiac negative stool  Extremities:   Extremities normal, atraumatic, no cyanosis or edema  Pulses:   2+ and symmetric all extremities  Skin:   Skin color, texture, turgor normal, no rashes or lesions  Lymph nodes:   Cervical, supraclavicular, and axillary nodes normal  Neurologic:   CNII-XII intact. Normal strength, sensation and reflexes      throughout       Assessment:     Pt is a an 89 yo male seen for AWV     Plan:     During the course of the visit the patient was educated and counseled about appropriate screening and preventive services including:   Td vaccine Influenza vaccine at local pharmacy.  No available in clinic yet. Colonoscopy done 11/19/2019.  FOBT normal on 07/12/2021.  Diet review for nutrition referral? Yes ____  Not Indicated __x_ Medicare annual wellness visit, subsequent  History of prostate cancer -stable -f/u with  Urology  Primary male hypogonadism -continue f/u with Urology  - Plan: CBC with Differential/Platelet, CBC with Differential/Platelet  Acquired hypothyroidism -synthroid  50 mcg  - Plan: TSH, TSH  Class 1 drug-induced obesity with serious comorbidity and body mass index (BMI) of 31.0 to 31.9 in adult  -Body mass index is 31.62 kg/m. - Plan: CBC with Differential/Platelet, Comprehensive metabolic panel with GFR, Lipid panel, Lipid panel, Comprehensive metabolic panel with GFR, CBC with Differential/Platelet  History of depression -stable -continue current meds -continue f/u with Psychiatry, Dr. Tasia  Need for Tdap vaccination  - Plan: Tdap vaccine greater than or equal to 7yo IM   Patient Instructions (the written plan) was given to the patient.  Medicare Attestation I have personally reviewed: The patient's medical and social history Their use of alcohol, tobacco or illicit drugs Their current medications and supplements The patient's functional ability including ADLs,fall risks, home safety risks, cognitive, and hearing and visual impairment Diet and physical activities Evidence for depression or mood disorders  The patient's weight, height, BMI, and visual acuity have been recorded in the chart.  I have made referrals, counseling, and provided education to the patient based on review of the above and I have provided the patient with a written personalized care plan for preventive services.     Clotilda JONELLE Single, MD   06/15/2024

## 2024-06-21 DIAGNOSIS — H401431 Capsular glaucoma with pseudoexfoliation of lens, bilateral, mild stage: Secondary | ICD-10-CM | POA: Diagnosis not present

## 2024-06-21 DIAGNOSIS — H2512 Age-related nuclear cataract, left eye: Secondary | ICD-10-CM | POA: Diagnosis not present

## 2024-06-21 DIAGNOSIS — H04123 Dry eye syndrome of bilateral lacrimal glands: Secondary | ICD-10-CM | POA: Diagnosis not present

## 2024-06-22 ENCOUNTER — Ambulatory Visit: Payer: Self-pay | Admitting: Family Medicine

## 2024-07-01 ENCOUNTER — Telehealth: Payer: Self-pay | Admitting: *Deleted

## 2024-07-01 DIAGNOSIS — E876 Hypokalemia: Secondary | ICD-10-CM

## 2024-07-01 NOTE — Telephone Encounter (Signed)
 Copied from CRM [id number]. Topic: Clinical - Medication Question >> Jul 01, 2024 12:29 PM Aisha D wrote: Reason for CRM: Pt is requesting a Covid prescription and would like it sent to the [facility].

## 2024-07-01 NOTE — Telephone Encounter (Signed)
 Copied from CRM [id number]. Topic: Clinical - Lab/Test Results >> Jul 01, 2024 12:35 PM Aisha D wrote: Reason for CRM: Pt was made aware of recent lab results and would like to schedule the recheck lab appt for the potassium. Labs needs to be ordered.

## 2024-07-04 NOTE — Telephone Encounter (Signed)
 Called and spoke with patient. Patient wanted a covid 19 Vaccine, patient was advised that he no longer need a prescription for the vaccine. Patient needed to come in for follow-up potassium, labs have been placed and patient has lab appt sch

## 2024-07-06 ENCOUNTER — Other Ambulatory Visit (INDEPENDENT_AMBULATORY_CARE_PROVIDER_SITE_OTHER)

## 2024-07-06 ENCOUNTER — Other Ambulatory Visit: Payer: Self-pay

## 2024-07-06 ENCOUNTER — Other Ambulatory Visit

## 2024-07-06 DIAGNOSIS — E876 Hypokalemia: Secondary | ICD-10-CM

## 2024-07-06 DIAGNOSIS — E875 Hyperkalemia: Secondary | ICD-10-CM

## 2024-07-06 LAB — COMPLETE METABOLIC PANEL WITHOUT GFR
AG Ratio: 1.7 (calc) (ref 1.0–2.5)
ALT: 24 U/L (ref 9–46)
AST: 21 U/L (ref 10–35)
Albumin: 4.2 g/dL (ref 3.6–5.1)
Alkaline phosphatase (APISO): 95 U/L (ref 35–144)
BUN: 25 mg/dL (ref 7–25)
CO2: 26 mmol/L (ref 20–32)
Calcium: 9.2 mg/dL (ref 8.6–10.3)
Chloride: 105 mmol/L (ref 98–110)
Creat: 1.21 mg/dL (ref 0.70–1.22)
Globulin: 2.5 g/dL (ref 1.9–3.7)
Glucose, Bld: 97 mg/dL (ref 65–99)
Potassium: 4.6 mmol/L (ref 3.5–5.3)
Sodium: 140 mmol/L (ref 135–146)
Total Bilirubin: 0.5 mg/dL (ref 0.2–1.2)
Total Protein: 6.7 g/dL (ref 6.1–8.1)

## 2024-07-07 DIAGNOSIS — Z23 Encounter for immunization: Secondary | ICD-10-CM | POA: Diagnosis not present

## 2024-07-08 ENCOUNTER — Ambulatory Visit: Payer: Self-pay | Admitting: Family Medicine

## 2024-07-21 DIAGNOSIS — K1321 Leukoplakia of oral mucosa, including tongue: Secondary | ICD-10-CM | POA: Diagnosis not present

## 2024-07-22 DIAGNOSIS — H524 Presbyopia: Secondary | ICD-10-CM | POA: Diagnosis not present

## 2024-07-22 DIAGNOSIS — H2512 Age-related nuclear cataract, left eye: Secondary | ICD-10-CM | POA: Diagnosis not present

## 2024-07-22 DIAGNOSIS — H401431 Capsular glaucoma with pseudoexfoliation of lens, bilateral, mild stage: Secondary | ICD-10-CM | POA: Diagnosis not present

## 2024-07-28 DIAGNOSIS — Z23 Encounter for immunization: Secondary | ICD-10-CM | POA: Diagnosis not present

## 2024-08-23 DIAGNOSIS — L821 Other seborrheic keratosis: Secondary | ICD-10-CM | POA: Diagnosis not present

## 2024-08-23 DIAGNOSIS — L814 Other melanin hyperpigmentation: Secondary | ICD-10-CM | POA: Diagnosis not present

## 2024-08-23 DIAGNOSIS — D1801 Hemangioma of skin and subcutaneous tissue: Secondary | ICD-10-CM | POA: Diagnosis not present

## 2024-08-23 DIAGNOSIS — L820 Inflamed seborrheic keratosis: Secondary | ICD-10-CM | POA: Diagnosis not present

## 2024-08-23 DIAGNOSIS — L853 Xerosis cutis: Secondary | ICD-10-CM | POA: Diagnosis not present

## 2024-08-29 DIAGNOSIS — F251 Schizoaffective disorder, depressive type: Secondary | ICD-10-CM | POA: Diagnosis not present

## 2025-06-16 ENCOUNTER — Encounter: Admitting: Family Medicine
